# Patient Record
Sex: Female | Born: 1940
Health system: Southern US, Community
[De-identification: ages and names within clinical notes are randomized; demographics above are authoritative.]

## PROBLEM LIST (undated history)

## (undated) DIAGNOSIS — I10 Essential (primary) hypertension: Secondary | ICD-10-CM

## (undated) DIAGNOSIS — M1990 Unspecified osteoarthritis, unspecified site: Secondary | ICD-10-CM

## (undated) DIAGNOSIS — E785 Hyperlipidemia, unspecified: Secondary | ICD-10-CM

## (undated) DIAGNOSIS — E119 Type 2 diabetes mellitus without complications: Secondary | ICD-10-CM

## (undated) DIAGNOSIS — M810 Age-related osteoporosis without current pathological fracture: Secondary | ICD-10-CM

## (undated) HISTORY — DX: Type 2 diabetes mellitus without complications: E11.9

## (undated) HISTORY — DX: Unspecified osteoarthritis, unspecified site: M19.90

## (undated) HISTORY — PX: EYE SURGERY: SHX253

## (undated) HISTORY — DX: Essential (primary) hypertension: I10

## (undated) HISTORY — DX: Hyperlipidemia, unspecified: E78.5

## (undated) HISTORY — PX: ABDOMINAL HYSTERECTOMY: SHX81

## (undated) HISTORY — DX: Age-related osteoporosis without current pathological fracture: M81.0

---

## 2014-12-20 LAB — HM DEXA SCAN: HM DEXA SCAN: NORMAL

## 2015-02-25 ENCOUNTER — Ambulatory Visit: Payer: Self-pay | Admitting: Physician Assistant

## 2016-06-04 ENCOUNTER — Telehealth: Payer: Self-pay | Admitting: Behavioral Health

## 2016-06-04 NOTE — Telephone Encounter (Signed)
Patient's daughter will be accompanying her mother to the appointment on Monday, 6/19 and voiced that they would like to update all information on that day.

## 2016-06-07 ENCOUNTER — Ambulatory Visit (INDEPENDENT_AMBULATORY_CARE_PROVIDER_SITE_OTHER): Payer: Medicare HMO | Admitting: Family

## 2016-06-07 ENCOUNTER — Telehealth: Payer: Self-pay | Admitting: Family

## 2016-06-07 ENCOUNTER — Encounter: Payer: Self-pay | Admitting: Family

## 2016-06-07 VITALS — BP 150/70 | HR 63 | Temp 98.3°F | Resp 16 | Ht 66.0 in | Wt 174.0 lb

## 2016-06-07 DIAGNOSIS — E785 Hyperlipidemia, unspecified: Secondary | ICD-10-CM

## 2016-06-07 DIAGNOSIS — R635 Abnormal weight gain: Secondary | ICD-10-CM

## 2016-06-07 DIAGNOSIS — M542 Cervicalgia: Secondary | ICD-10-CM

## 2016-06-07 DIAGNOSIS — R7303 Prediabetes: Secondary | ICD-10-CM | POA: Insufficient documentation

## 2016-06-07 DIAGNOSIS — E559 Vitamin D deficiency, unspecified: Secondary | ICD-10-CM | POA: Diagnosis not present

## 2016-06-07 DIAGNOSIS — R55 Syncope and collapse: Secondary | ICD-10-CM

## 2016-06-07 DIAGNOSIS — I10 Essential (primary) hypertension: Secondary | ICD-10-CM

## 2016-06-07 DIAGNOSIS — E119 Type 2 diabetes mellitus without complications: Secondary | ICD-10-CM

## 2016-06-07 DIAGNOSIS — M5430 Sciatica, unspecified side: Secondary | ICD-10-CM

## 2016-06-07 LAB — LIPID PANEL
CHOL/HDL RATIO: 4
Cholesterol: 314 mg/dL — ABNORMAL HIGH (ref 0–200)
HDL: 83.7 mg/dL (ref >39.00)
LDL Cholesterol: 215 mg/dL — ABNORMAL HIGH (ref 0–99)
NONHDL: 229.84
TRIGLYCERIDES: 74 mg/dL (ref 0.0–149.0)
VLDL: 14.8 mg/dL (ref 0.0–40.0)

## 2016-06-07 LAB — MICROALBUMIN / CREATININE URINE RATIO
CREATININE, U: 45.9 mg/dL
MICROALB UR: 0.7 mg/dL (ref 0.0–1.9)
Microalb Creat Ratio: 1.5 mg/g (ref 0.0–30.0)

## 2016-06-07 LAB — BASIC METABOLIC PANEL
BUN: 12 mg/dL (ref 6–23)
CALCIUM: 9.6 mg/dL (ref 8.4–10.5)
CHLORIDE: 103 meq/L (ref 96–112)
CO2: 24 meq/L (ref 19–32)
CREATININE: 1.16 mg/dL (ref 0.40–1.20)
GFR: 58.23 mL/min — ABNORMAL LOW (ref >60.00)
GLUCOSE: 82 mg/dL (ref 70–99)
Potassium: 3.8 mEq/L (ref 3.5–5.1)
Sodium: 139 mEq/L (ref 135–145)

## 2016-06-07 LAB — VITAMIN D 25 HYDROXY (VIT D DEFICIENCY, FRACTURES): VITD: 54.39 ng/mL (ref 30.00–100.00)

## 2016-06-07 LAB — HEMOGLOBIN A1C: HEMOGLOBIN A1C: 5.3 % (ref 4.6–6.5)

## 2016-06-07 LAB — TSH: TSH: 2.77 u[IU]/mL (ref 0.35–4.50)

## 2016-06-07 MED ORDER — MELOXICAM 7.5 MG PO TABS: 7.5000 mg | ORAL_TABLET | Freq: Every day | ORAL | Status: DC

## 2016-06-07 MED ORDER — ATORVASTATIN CALCIUM 20 MG PO TABS: 20.0000 mg | ORAL_TABLET | Freq: Every day | ORAL | Status: DC

## 2016-06-07 MED ORDER — POTASSIUM CHLORIDE CRYS ER 20 MEQ PO TBCR: 20.0000 meq | EXTENDED_RELEASE_TABLET | Freq: Every day | ORAL | Status: DC

## 2016-06-07 NOTE — Assessment & Plan Note (Signed)
D/c simvastatin due to drug interaction with norvasc. Start atorvastatin 20mg .

## 2016-06-07 NOTE — Assessment & Plan Note (Signed)
Obtain follow up vit D level.  

## 2016-06-07 NOTE — Assessment & Plan Note (Signed)
Discussed diabetic diet.  Continue metformin. Obtain A1C, bmet, urine microalbumin.

## 2016-06-07 NOTE — Telephone Encounter (Signed)
Could you please request results from [facility] ER visit 2/17? Thanks

## 2016-06-07 NOTE — Progress Notes (Signed)
Pre visit review using our clinic review tool, if applicable. No additional management support is needed unless otherwise documented below in the visit note. 

## 2016-06-07 NOTE — Patient Instructions (Addendum)
Please complete lab work prior to leaving. Stop simvastatin, start atorvastatin. You will be contacted about scheduling your echocardiogram to check your hart.  For your back/hip pain- begin meloxicam once daily.  Let me know if your symptoms worsen or do not improve in the next 2 weeks.  Welcome to [facility]!

## 2016-06-07 NOTE — Assessment & Plan Note (Signed)
SBP a bit above goal. Continue current meds for now. Consider adjusting meds next visit if BP still above goal.

## 2016-06-07 NOTE — Telephone Encounter (Signed)
Records request faxed 

## 2016-06-07 NOTE — Progress Notes (Signed)
Subjective:    Patient ID: Sherry Perez, female    DOB: 12-08-1941, 75 y.o.   MRN: [id number]  HPI  Ms. Sherry Perez is a 75 yr old female who presents today to establish care.   Her chief complaint today is intermittent headaches (1x a week) for 1 year.  HA's last about 30 minutes, usually resolve if she lays down and relaxes herself. She reports a 2/17 and has had neck pain, left hip pain since the fall. Reports that she got dizzy and hit the floor (reports + syncope).  She went to [facility] at that time. She reports no further syncope or dizziness since that time.  Reports neck pain is left sided.  Reports hip pain is intermittent (worse with prolonged standing).  Describes pain as shooting down the left buttock.     Pmhx is significant for the following:  1) DM2- on metformin. Patient did not know she was diabetic.    2) HTN- maintained on plendil, amlodipine, lasix.   3) Hyperlipidemia- maintained on simvastatin.   4) Vit D deficiency- on oral supplement.    Review of Systems  Constitutional:       Reports that her weight is up 25 pounds over the last 6 months which she attributes to hip pain and less activity  HENT: Negative for rhinorrhea.   Eyes: Negative for visual disturbance.  Respiratory: Negative for cough.   Cardiovascular:       Intermittent LE swelling  Gastrointestinal: Negative for diarrhea.       Mild constipation, improved with dietary modification (increased veggies)  Genitourinary: Negative for dysuria and frequency.  Neurological: Positive for headaches.  Psychiatric/Behavioral:       Denies depression/anxiety   Past Medical History  Diagnosis Date  . Diabetes mellitus without complication ([place])   . HTN (hypertension)   . Hyperlipidemia      Social History   Social History  . Marital Status: Married    Spouse Name: N/A  . Number of Children: N/A  . Years of Education: N/A   Occupational History  . Not on file.   Social History Main  Topics  . Smoking status: Never Smoker   . Smokeless tobacco: Never Used  . Alcohol Use: No  . Drug Use: Not on file  . Sexual Activity: Not on file   Other Topics Concern  . 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 Abdominal hysterectomy      Family History  Problem Relation Age of Onset  . Diabetes Mother     No Known Allergies  No current outpatient prescriptions on file prior to visit.   No current facility-administered medications on file prior to visit.    BP 150/70 mmHg  Pulse 63  Temp(Src) 98.3 F (36.8 C) (Oral)  Resp 16  Ht 5\' 6"  (1.676 m)  Wt 174 lb (78.926 kg)  BMI 28.10 kg/m2  SpO2 100%       Objective:   Physical Exam  Constitutional: She is oriented to person, place, and time. She appears well-developed and well-nourished.  HENT:  Head: Normocephalic and atraumatic.  Right Ear: Tympanic membrane  and ear canal normal.  Left Ear: Tympanic membrane and ear canal normal.  Eyes: No scleral icterus.  Neck: No thyromegaly present.  Cardiovascular: Normal rate, regular rhythm and normal heart sounds.   No murmur heard. JVD noted bilaterally  Pulmonary/Chest: Effort normal and breath sounds normal. No respiratory distress. She has no wheezes.  Musculoskeletal:  1+ bilateral LE edema  Lymphadenopathy:    She has no cervical adenopathy.  Neurological: She is alert and oriented to person, place, and time.  Skin: Skin is warm and dry.  Psychiatric: She has a normal mood and affect. Her behavior is normal. Judgment and thought content normal.          Assessment & Plan:  Syncope/JVD- obtain 2D echo. Will request records from [facility] re:  Syncope visit in February of  this year.   Neck pain/sciatica- trial of short course of meloxicam.

## 2016-06-08 ENCOUNTER — Other Ambulatory Visit: Payer: Self-pay | Admitting: Family

## 2016-06-08 DIAGNOSIS — E785 Hyperlipidemia, unspecified: Secondary | ICD-10-CM

## 2016-06-08 MED ORDER — ATORVASTATIN CALCIUM 20 MG PO TABS: 40.0000 mg | ORAL_TABLET | Freq: Every day | ORAL | Status: DC

## 2016-06-08 NOTE — Telephone Encounter (Signed)
Notified pt and she voices understanding. Lab appt scheduled for 07/22/16 and order entered. Rx was sent yesterday to [facility] mail order for 20mg  once a day. Spoke with Georgina Snell at [facility] and he states they have not received atorvastatin 20mg  Rx. He will make note to cancel it if it still comes through. New dose sent with note to replace 20mg  Rx.

## 2016-06-08 NOTE — Telephone Encounter (Signed)
Cholesterol is extremely high. I would like her to increase lipitor from 20mg  to 40mg  once daily, repeat flp in 1 month. Sugar, thyroid, vitamin D, blood count all look good.

## 2016-06-09 NOTE — Telephone Encounter (Signed)
Received fax from [facility] stating they do not have any records for an ER visit.

## 2016-06-09 NOTE — Telephone Encounter (Signed)
Pt stated she went to [facility], could you please call her and verify where else she may have gone for that visit? Thanks.

## 2016-06-09 NOTE — Telephone Encounter (Signed)
Spoke with pt. States she was admitted to [facility] in February of 2017. Faxed request again. Awaiting response.

## 2016-06-10 MED ORDER — ATORVASTATIN CALCIUM 20 MG PO TABS: 40.0000 mg | ORAL_TABLET | Freq: Every day | ORAL | Status: DC

## 2016-06-10 NOTE — Addendum Note (Signed)
Addended by: Kelle Darting A on: 06/10/2016 10:02 AM   Modules accepted: Orders

## 2016-06-14 ENCOUNTER — Telehealth: Payer: Self-pay | Admitting: *Deleted

## 2016-06-14 NOTE — Telephone Encounter (Signed)
PA for atorvastatin submitted to [facility]. PA Case [id number] pending, awaiting determination. JG//CMA

## 2016-06-14 NOTE — Telephone Encounter (Signed)
Received call from medical records at [facility] and they verified that pt's last visit with them was 01/2015. They faxed information and it has been forwarded to PCP for review.

## 2016-06-14 NOTE — Telephone Encounter (Signed)
Left message on medical records voicemail at [facility] to check records again and let us know outcome.

## 2016-06-16 ENCOUNTER — Telehealth: Payer: Self-pay | Admitting: Family

## 2016-06-16 NOTE — Telephone Encounter (Signed)
Please cancel echo and notify pt. I received echo report from [facility]. Thanks.

## 2016-06-16 NOTE — Telephone Encounter (Signed)
PA approved  PA Case: [id number], Status: Approved, Coverage Starts on: 06/14/2016 12:00:00 AM, Coverage Ends on: 09/14/2016 12:00:00 AM. Questions? Contact [phone].

## 2016-06-17 NOTE — Telephone Encounter (Signed)
Appointment cancelled. Left detailed message on pt's home voicemail and to call if any questions.

## 2016-06-25 ENCOUNTER — Other Ambulatory Visit (HOSPITAL_COMMUNITY): Payer: Medicare HMO

## 2016-07-22 ENCOUNTER — Other Ambulatory Visit: Payer: Medicare HMO

## 2016-09-13 ENCOUNTER — Ambulatory Visit (INDEPENDENT_AMBULATORY_CARE_PROVIDER_SITE_OTHER): Payer: Commercial Managed Care - HMO | Admitting: Family

## 2016-09-13 ENCOUNTER — Encounter: Payer: Self-pay | Admitting: Family

## 2016-09-13 ENCOUNTER — Telehealth: Payer: Self-pay | Admitting: Family

## 2016-09-13 VITALS — BP 172/74 | HR 66 | Temp 98.2°F | Resp 16 | Ht 66.0 in | Wt 168.0 lb

## 2016-09-13 DIAGNOSIS — E119 Type 2 diabetes mellitus without complications: Secondary | ICD-10-CM | POA: Diagnosis not present

## 2016-09-13 DIAGNOSIS — E785 Hyperlipidemia, unspecified: Secondary | ICD-10-CM | POA: Diagnosis not present

## 2016-09-13 DIAGNOSIS — Z23 Encounter for immunization: Secondary | ICD-10-CM

## 2016-09-13 DIAGNOSIS — I10 Essential (primary) hypertension: Secondary | ICD-10-CM

## 2016-09-13 DIAGNOSIS — M1990 Unspecified osteoarthritis, unspecified site: Secondary | ICD-10-CM | POA: Insufficient documentation

## 2016-09-13 LAB — LIPID PANEL
CHOL/HDL RATIO: 2
Cholesterol: 213 mg/dL — ABNORMAL HIGH (ref 0–200)
HDL: 85.4 mg/dL (ref >39.00)
LDL Cholesterol: 115 mg/dL — ABNORMAL HIGH (ref 0–99)
NonHDL: 127.89
TRIGLYCERIDES: 66 mg/dL (ref 0.0–149.0)
VLDL: 13.2 mg/dL (ref 0.0–40.0)

## 2016-09-13 LAB — BASIC METABOLIC PANEL
BUN: 14 mg/dL (ref 6–23)
CALCIUM: 9.4 mg/dL (ref 8.4–10.5)
CO2: 27 meq/L (ref 19–32)
Chloride: 102 mEq/L (ref 96–112)
Creatinine, Ser: 1.13 mg/dL (ref 0.40–1.20)
GFR: 59.97 mL/min — AB (ref >60.00)
Glucose, Bld: 79 mg/dL (ref 70–99)
POTASSIUM: 3.7 meq/L (ref 3.5–5.1)
SODIUM: 139 meq/L (ref 135–145)

## 2016-09-13 LAB — HEMOGLOBIN A1C: HEMOGLOBIN A1C: 5.7 % (ref 4.6–6.5)

## 2016-09-13 MED ORDER — POTASSIUM CHLORIDE CRYS ER 20 MEQ PO TBCR: 20.0000 meq | EXTENDED_RELEASE_TABLET | Freq: Every day | ORAL | 1 refills | Status: DC

## 2016-09-13 MED ORDER — FUROSEMIDE 40 MG PO TABS: 40.0000 mg | ORAL_TABLET | Freq: Every day | ORAL | 1 refills | Status: DC

## 2016-09-13 MED ORDER — AMLODIPINE BESYLATE 10 MG PO TABS
10.0000 mg | ORAL_TABLET | Freq: Every day | ORAL | 1 refills | Status: DC
Start: 2016-09-13 — End: 2017-02-22

## 2016-09-13 MED ORDER — ATORVASTATIN CALCIUM 40 MG PO TABS: 40.0000 mg | ORAL_TABLET | Freq: Every day | ORAL | 1 refills | Status: DC

## 2016-09-13 MED ORDER — METFORMIN HCL ER 500 MG PO TB24: 500.0000 mg | ORAL_TABLET | Freq: Two times a day (BID) | ORAL | 1 refills | Status: DC

## 2016-09-13 MED ORDER — ACETAMINOPHEN 500 MG PO TABS: 1000.0000 mg | ORAL_TABLET | Freq: Two times a day (BID) | ORAL | 0 refills | Status: DC

## 2016-09-13 NOTE — Telephone Encounter (Signed)
Cholesterol is much better but still above goal. I would like her to increase atorvastatin to 40mg . Rx has been sent to her mail order.  Repeat flp in 6 weeks. Sugar appears well controlled.

## 2016-09-13 NOTE — Telephone Encounter (Signed)
Patient has been updated on labs she voices understanding. PC

## 2016-09-13 NOTE — Progress Notes (Signed)
Subjective:    Patient ID: Sherry Perez, female    DOB: Nov 05, 1941, 75 y.o.   MRN: [id number]  HPI  Sherry Perez is a 75 yr old female who presents today for follow up.  1) DM2- She is currently maintained on metformin. She reports that her sugars at home are generally 120-160. She tries to be compliant with diet.  Lab Results  Component Value Date   HGBA1C 5.3 06/07/2016   Lab Results  Component Value Date   MICROALBUR 0.7 06/07/2016   LDLCALC 215 (H) 06/07/2016   CREATININE 1.16 06/07/2016   2) HTN- current medications include amlodipine 5mg  and furosemide.  She denies CP or sob.   BP Readings from Last 3 Encounters:  09/13/16 (!) 174/56  06/07/16 (!) 150/70   3) Hyperlipidemia- she is maintained on lipitor 20mg .  Lab Results  Component Value Date   CHOL 314 (H) 06/07/2016   HDL 83.70 06/07/2016   LDLCALC 215 (H) 06/07/2016   TRIG 74.0 06/07/2016   CHOLHDL 4 06/07/2016   4) Bilateral leg pain- Reports that her pain is intermittent and is in her bilateral knees.  She does report some left hip pain.  Also has shoulder and hip pain. Tylenol helps.  She doesn't take every day.   5) Neck problem-  Popping in the neck with turning.   Review of Systems See HPI  Past Medical History:  Diagnosis Date  . Diabetes mellitus without complication ([place])   . HTN (hypertension)   . Hyperlipidemia      Social History   Social History  . Marital status: Married    Spouse name: N/A  . Number of children: N/A  . Years of education: N/A   Occupational History  . Not on file.   Social History Main Topics  . Smoking status: Never Smoker  . Smokeless tobacco: Never Used  . Alcohol use No  . Drug use: Unknown  . Sexual activity: Not on file   Other Topics Concern  . 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 ABDOMINAL HYSTERECTOMY      Family History  Problem Relation Age of Onset  . Diabetes Mother     No Known Allergies  Current Outpatient Prescriptions on File Prior to Visit  Medication Sig Dispense Refill  . aspirin 81 MG tablet Take 81 mg by mouth daily.    Marland Kitchen atorvastatin (LIPITOR) 20 MG tablet Take 2 tablets (40 mg total) by mouth daily. 180 tablet 1  . cholecalciferol (VITAMIN D) 1000 units tablet Take 1,000 Units by mouth daily.     No current facility-administered medications on file prior to visit.     BP (!) 172/74   Pulse 66   Temp 98.2 F (36.8 C) (Oral)   Resp 16   Ht 5\' 6"  (1.676 m)   Wt 168 lb (76.2 kg)   SpO2 100% Comment: room air  BMI 27.12 kg/m       Objective:   Physical Exam  Constitutional: She is oriented to person, place, and time. She appears well-developed and well-nourished.  HENT:  Head: Normocephalic and atraumatic.  Cardiovascular: Normal rate,  regular rhythm and normal heart sounds.   No murmur heard. Pulmonary/Chest: Effort normal and breath sounds normal. No respiratory distress. She has no wheezes.  Neurological: She is alert and oriented to person, place, and time.  Psychiatric: She has a normal mood and affect. Her behavior is normal. Judgment and thought content normal.          Assessment & Plan:  Flu shot today.

## 2016-09-13 NOTE — Assessment & Plan Note (Signed)
Clinically stable on metformin, continue same. Obtain A1C. Flu shot today.

## 2016-09-13 NOTE — Assessment & Plan Note (Signed)
Will see how her numbers look on lipitor. Obtain flp.

## 2016-09-13 NOTE — Patient Instructions (Signed)
Increase amlodipine from 5mg  to 10mg  once daily.  Add tylenol 1000mg  twice daily for arthritis pain. Complete lab work prior to leaving.

## 2016-09-13 NOTE — Assessment & Plan Note (Signed)
Uncontrolled. Will increase amlodipine from 5mg  to 10mg .  Continue lasix. Obtain bmet.

## 2016-09-13 NOTE — Assessment & Plan Note (Signed)
Likely cause for neck, knee, hip pain. Advised pt to add standing bid tylenol dose. Would like to avoid chronic nsaid use given her age and DM2 if possible.  We will re-evaluate in 1 month.

## 2016-09-13 NOTE — Progress Notes (Signed)
Pre visit review using our clinic review tool, if applicable. No additional management support is needed unless otherwise documented below in the visit note. 

## 2016-10-11 ENCOUNTER — Encounter: Payer: Self-pay | Admitting: Family

## 2016-10-11 ENCOUNTER — Ambulatory Visit (INDEPENDENT_AMBULATORY_CARE_PROVIDER_SITE_OTHER): Payer: Commercial Managed Care - HMO | Admitting: Family

## 2016-10-11 DIAGNOSIS — E119 Type 2 diabetes mellitus without complications: Secondary | ICD-10-CM | POA: Diagnosis not present

## 2016-10-11 DIAGNOSIS — I10 Essential (primary) hypertension: Secondary | ICD-10-CM | POA: Diagnosis not present

## 2016-10-11 DIAGNOSIS — E559 Vitamin D deficiency, unspecified: Secondary | ICD-10-CM

## 2016-10-11 DIAGNOSIS — E785 Hyperlipidemia, unspecified: Secondary | ICD-10-CM | POA: Diagnosis not present

## 2016-10-11 MED ORDER — POTASSIUM CHLORIDE CRYS ER 20 MEQ PO TBCR: 10.0000 meq | EXTENDED_RELEASE_TABLET | Freq: Every day | ORAL | 1 refills | Status: DC

## 2016-10-11 MED ORDER — LISINOPRIL 10 MG PO TABS: 10.0000 mg | ORAL_TABLET | Freq: Every day | ORAL | 3 refills | Status: DC

## 2016-10-11 NOTE — Assessment & Plan Note (Signed)
BP is uncontrolled. Will add lisinopril, cut Kdur dose in half to avoid hyperkalemia.

## 2016-10-11 NOTE — Assessment & Plan Note (Signed)
Clinically stable on metformin, continue same.

## 2016-10-11 NOTE — Progress Notes (Signed)
Subjective:    Patient ID: Sherry Perez, female    DOB: November 27, 1941, 75 y.o.   MRN: [id number]  HPI  Sherry Perez is a 75 yr old female who presents today for follow up.  1) HTN- current BP medications include amlodipine 10mg .  She is also maintained on lasix 40mg .  Denies CP/SOB or swelling.  BP Readings from Last 3 Encounters:  10/11/16 (!) 179/63  09/13/16 (!) 172/74  06/07/16 (!) 150/70   2) Hyperlipidemia- last visit her lipitor dose was increased from 20mg  to 40mg . Denies myalgia. Lab Results  Component Value Date   CHOL 213 (H) 09/13/2016   HDL 85.40 09/13/2016   LDLCALC 115 (H) 09/13/2016   TRIG 66.0 09/13/2016   CHOLHDL 2 09/13/2016   3) DM2- maintained on metformin.   Lab Results  Component Value Date   HGBA1C 5.7 09/13/2016   HGBA1C 5.3 06/07/2016   Lab Results  Component Value Date   MICROALBUR 0.7 06/07/2016   LDLCALC 115 (H) 09/13/2016   CREATININE 1.13 09/13/2016       Review of Systems See HPI  Past Medical History:  Diagnosis Date  . Diabetes mellitus without complication ([place])   . HTN (hypertension)   . Hyperlipidemia      Social History   Social History  . Marital status: Married    Spouse name: N/A  . Number of children: N/A  . Years of education: N/A   Occupational History  . Not on file.   Social History Main Topics  . Smoking status: Never Smoker  . Smokeless tobacco: Never Used  . Alcohol use No  . Drug use: Unknown  . Sexual activity: Not on file   Other Topics Concern  . 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 ABDOMINAL HYSTERECTOMY      Family History  Problem Relation Age of Onset  . Diabetes Mother     No Known  Allergies  Current Outpatient Prescriptions on File Prior to Visit  Medication Sig Dispense Refill  . acetaminophen (TYLENOL) 500 MG tablet Take 2 tablets (1,000 mg total) by mouth 2 (two) times daily. 30 tablet 0  . amLODipine (NORVASC) 10 MG tablet Take 1 tablet (10 mg total) by mouth daily. 90 tablet 1  . aspirin 81 MG tablet Take 81 mg by mouth daily.    Marland Kitchen atorvastatin (LIPITOR) 40 MG tablet Take 1 tablet (40 mg total) by mouth daily. 90 tablet 1  . cholecalciferol (VITAMIN D) 1000 units tablet Take 1,000 Units by mouth daily.    . furosemide (LASIX) 40 MG tablet Take 1 tablet (40 mg total) by mouth daily. 90 tablet 1  . metFORMIN (GLUCOPHAGE-XR) 500 MG 24 hr tablet Take 1 tablet (500 mg total) by mouth 2 (two) times daily. 90 tablet 1   No current facility-administered medications on file prior to visit.     BP (!) 179/63 (BP Location: Right Arm, Patient Position: Sitting, Cuff Size: Normal)   Pulse 74   Temp 98.5 F (36.9 C) (Oral)   Resp 16   Ht 5\' 6"  (1.676 m)   Wt 165 lb (74.8 kg)  SpO2 100% Comment: room air  BMI 26.63 kg/m       Objective:   Physical Exam  Constitutional: She is oriented to person, place, and time. She appears well-developed and well-nourished.  HENT:  Head: Normocephalic and atraumatic.  Cardiovascular: Normal rate, regular rhythm and normal heart sounds.   No murmur heard. Pulmonary/Chest: Effort normal and breath sounds normal. No respiratory distress. She has no wheezes.  Musculoskeletal: She exhibits no edema.  Neurological: She is alert and oriented to person, place, and time.  Psychiatric: She has a normal mood and affect. Her behavior is normal. Judgment and thought content normal.          Assessment & Plan:  Will request records from her previous PCP re: immunizations. She believes she had a pneumovax.

## 2016-10-11 NOTE — Assessment & Plan Note (Signed)
Tolerating increased dose of statin. Plan to repeat lipid panel at her follow up visit in 2 weeks.

## 2016-10-11 NOTE — Patient Instructions (Addendum)
Start lisinopril 10mg  once daily. Cut your potassium supplement in half and take 1/2 tab once daily.

## 2016-10-19 ENCOUNTER — Telehealth: Payer: Self-pay

## 2016-10-19 NOTE — Telephone Encounter (Signed)
Release of Records Request (Immunization Records) faxed to [facility] at [phone] on 10/19/2016. Awaiting records.

## 2016-10-22 NOTE — Telephone Encounter (Signed)
Angie-- have you seen any of these?

## 2016-10-25 ENCOUNTER — Ambulatory Visit (INDEPENDENT_AMBULATORY_CARE_PROVIDER_SITE_OTHER): Payer: Commercial Managed Care - HMO | Admitting: Family

## 2016-10-25 ENCOUNTER — Encounter: Payer: Self-pay | Admitting: Family

## 2016-10-25 VITALS — BP 133/66 | HR 73 | Temp 98.7°F | Resp 16 | Ht 66.0 in | Wt 163.8 lb

## 2016-10-25 DIAGNOSIS — I10 Essential (primary) hypertension: Secondary | ICD-10-CM | POA: Diagnosis not present

## 2016-10-25 DIAGNOSIS — E785 Hyperlipidemia, unspecified: Secondary | ICD-10-CM | POA: Diagnosis not present

## 2016-10-25 LAB — BASIC METABOLIC PANEL
BUN: 18 mg/dL (ref 6–23)
CALCIUM: 9.8 mg/dL (ref 8.4–10.5)
CO2: 31 mEq/L (ref 19–32)
CREATININE: 1.41 mg/dL — AB (ref 0.40–1.20)
Chloride: 98 mEq/L (ref 96–112)
GFR: 46.44 mL/min — AB (ref >60.00)
Glucose, Bld: 84 mg/dL (ref 70–99)
Potassium: 4.2 mEq/L (ref 3.5–5.1)
SODIUM: 139 meq/L (ref 135–145)

## 2016-10-25 LAB — LIPID PANEL
CHOLESTEROL: 201 mg/dL — AB (ref 0–200)
HDL: 82.3 mg/dL (ref >39.00)
LDL Cholesterol: 103 mg/dL — ABNORMAL HIGH (ref 0–99)
NonHDL: 118.44
TRIGLYCERIDES: 78 mg/dL (ref 0.0–149.0)
Total CHOL/HDL Ratio: 2
VLDL: 15.6 mg/dL (ref 0.0–40.0)

## 2016-10-25 NOTE — Assessment & Plan Note (Signed)
Tolerating statin, obtain follow-up lipid panel. 

## 2016-10-25 NOTE — Progress Notes (Signed)
Subjective:    Patient ID: Sherry Perez, female    DOB: 1941-04-16, 75 y.o.   MRN: [id number]  HPI  Sherry Perez is a 75 yr old female who presents today for follow up.  Hypertension- Last visit lisinopril 10mg  was added to her lasix and her amlodipine.  She was advised to cut her potassium supplement in half.  She notes tolerating the medication. She does report that she has had some mild dizziness a few times when she stood up suddenly. Also notes a mild dry cough without any other cold symptoms. She does not find these symptoms severe enough to discontinue the lisinopril.   BP Readings from Last 3 Encounters:  10/25/16 133/66  10/11/16 (!) 179/63  09/13/16 (!) 172/74   Hyperlipidemia- statin dose was increased a few months back.     Review of Systems See HPI  Past Medical History:  Diagnosis Date  . Diabetes mellitus without complication ([place])   . HTN (hypertension)   . Hyperlipidemia      Social History   Social History  . Marital status: Married    Spouse name: N/A  . Number of children: N/A  . Years of education: N/A   Occupational History  . Not on file.   Social History Main Topics  . Smoking status: Never Smoker  . Smokeless tobacco: Never Used  . Alcohol use No  . Drug use: Unknown  . Sexual activity: Not on file   Other Topics Concern  . 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 ABDOMINAL HYSTERECTOMY      Family History  Problem Relation Age of Onset  . Diabetes Mother     No Known Allergies  Current Outpatient Prescriptions on File Prior to Visit  Medication Sig Dispense Refill  . acetaminophen (TYLENOL) 500 MG tablet Take 2 tablets (1,000 mg total) by mouth 2  (two) times daily. 30 tablet 0  . amLODipine (NORVASC) 10 MG tablet Take 1 tablet (10 mg total) by mouth daily. 90 tablet 1  . aspirin 81 MG tablet Take 81 mg by mouth daily.    Marland Kitchen atorvastatin (LIPITOR) 40 MG tablet Take 1 tablet (40 mg total) by mouth daily. 90 tablet 1  . cholecalciferol (VITAMIN D) 1000 units tablet Take 1,000 Units by mouth daily.    . furosemide (LASIX) 40 MG tablet Take 1 tablet (40 mg total) by mouth daily. 90 tablet 1  . lisinopril (PRINIVIL,ZESTRIL) 10 MG tablet Take 1 tablet (10 mg total) by mouth daily. 30 tablet 3  . metFORMIN (GLUCOPHAGE-XR) 500 MG 24 hr tablet Take 1 tablet (500 mg total) by mouth 2 (two) times daily. 90 tablet 1  . potassium chloride SA (K-DUR,KLOR-CON) 20 MEQ tablet Take 0.5 tablets (10 mEq total) by mouth daily. 90 tablet 1   No current facility-administered medications on file prior to visit.     BP 133/66 (BP Location: Left Arm, Patient Position: Sitting, Cuff Size: Normal)   Pulse 73   Temp 98.7 F (37.1 C) (Oral)   Resp 16   Ht 5\' 6"  (1.676 m)   Wt 163 lb 12.8 oz (74.3 kg)  SpO2 100% Comment: RA  BMI 26.44 kg/m       Objective:   Physical Exam  Constitutional: She is oriented to person, place, and time. She appears well-developed and well-nourished.  HENT:  Head: Normocephalic and atraumatic.  Cardiovascular: Normal rate, regular rhythm and normal heart sounds.   No murmur heard. Pulmonary/Chest: Effort normal and breath sounds normal. No respiratory distress. She has no wheezes.  Musculoskeletal: She exhibits no edema.  Neurological: She is alert and oriented to person, place, and time.  Skin: Skin is warm and dry.  Psychiatric: She has a normal mood and affect. Her behavior is normal. Judgment and thought content normal.          Assessment & Plan:

## 2016-10-25 NOTE — Progress Notes (Signed)
Pre visit review using our clinic review tool, if applicable. No additional management support is needed unless otherwise documented below in the visit note. 

## 2016-10-25 NOTE — Assessment & Plan Note (Signed)
BP stable on current medications.  I have advised pt to stand slowly. Let me know if cough or dizziness worsen.Obtain follow up bmet.

## 2016-10-25 NOTE — Patient Instructions (Signed)
Pleas complete lab work prior to leaving.  

## 2016-10-26 ENCOUNTER — Telehealth: Payer: Self-pay | Admitting: Family

## 2016-10-26 MED ORDER — METOPROLOL SUCCINATE ER 50 MG PO TB24: 50.0000 mg | ORAL_TABLET | Freq: Every day | ORAL | 3 refills | Status: DC

## 2016-10-26 NOTE — Telephone Encounter (Signed)
Kidney function has worsened slightly.  I would like her to stop lisinopril, begin toprol xl once daily, follow up in 1 month.  Avoid any NSAIDS if she is taking.

## 2016-10-26 NOTE — Telephone Encounter (Signed)
Patient was made aware of labs and provider's recommendations. She voiced understanding and did not have any further questions or concerns. Follow-up appointment scheduled for 11/26/16 at 2:00 PM.

## 2016-11-25 ENCOUNTER — Telehealth: Payer: Self-pay | Admitting: *Deleted

## 2016-11-25 NOTE — Telephone Encounter (Signed)
Pt declined

## 2016-11-26 ENCOUNTER — Ambulatory Visit (INDEPENDENT_AMBULATORY_CARE_PROVIDER_SITE_OTHER): Payer: Commercial Managed Care - HMO | Admitting: Family

## 2016-11-26 ENCOUNTER — Encounter: Payer: Self-pay | Admitting: Family

## 2016-11-26 VITALS — BP 138/58 | HR 66 | Temp 98.4°F | Resp 16 | Ht 66.0 in | Wt 157.2 lb

## 2016-11-26 DIAGNOSIS — E1121 Type 2 diabetes mellitus with diabetic nephropathy: Secondary | ICD-10-CM | POA: Diagnosis not present

## 2016-11-26 DIAGNOSIS — I10 Essential (primary) hypertension: Secondary | ICD-10-CM

## 2016-11-26 DIAGNOSIS — R059 Cough, unspecified: Secondary | ICD-10-CM

## 2016-11-26 DIAGNOSIS — R05 Cough: Secondary | ICD-10-CM | POA: Diagnosis not present

## 2016-11-26 LAB — BASIC METABOLIC PANEL
BUN: 14 mg/dL (ref 7–25)
CHLORIDE: 98 mmol/L (ref 98–110)
CO2: 27 mmol/L (ref 20–31)
CREATININE: 1.25 mg/dL — AB (ref 0.60–0.93)
Calcium: 10.1 mg/dL (ref 8.6–10.4)
Glucose, Bld: 87 mg/dL (ref 65–99)
POTASSIUM: 4.1 mmol/L (ref 3.5–5.3)
Sodium: 140 mmol/L (ref 135–146)

## 2016-11-26 NOTE — Progress Notes (Signed)
Subjective:    Patient ID: Sherry Perez, female    DOB: November 21, 1941, 75 y.o.   MRN: [id number]  HPI  Sherry Perez is a 75 yr old female who presents today for follow up.  Renal insufficiency- last visit she was noted to have mild worsening of her kidney function.  She was advised to avoid NSAIDS.  Lab Results  Component Value Date   CREATININE 1.41 (H) 10/25/2016   HTN- denies CP/SOB or swelling.  BP Readings from Last 3 Encounters:  11/26/16 (!) 138/58  10/25/16 133/66  10/11/16 (!) 179/63   Reports a dry cough in the AM. No fever.    Lab Results  Component Value Date   HGBA1C 5.7 09/13/2016   HGBA1C 5.3 06/07/2016   Lab Results  Component Value Date   MICROALBUR 0.7 06/07/2016   LDLCALC 103 (H) 10/25/2016   CREATININE 1.41 (H) 10/25/2016      Review of Systems    see HPI  Past Medical History:  Diagnosis Date  . Diabetes mellitus without complication ([place])   . HTN (hypertension)   . Hyperlipidemia      Social History   Social History  . Marital status: Married    Spouse name: N/A  . Number of children: N/A  . Years of education: N/A   Occupational History  . Not on file.   Social History Main Topics  . Smoking status: Never Smoker  . Smokeless tobacco: Never Used  . Alcohol use No  . Drug use: Unknown  . Sexual activity: Not on file   Other Topics Concern  . 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 ABDOMINAL HYSTERECTOMY      Family History  Problem Relation Age of Onset  . Diabetes Mother     Allergies  Allergen Reactions  . Lisinopril     Renal insufficiency    Current Outpatient Prescriptions on File Prior to Visit  Medication Sig Dispense Refill  .  acetaminophen (TYLENOL) 500 MG tablet Take 2 tablets (1,000 mg total) by mouth 2 (two) times daily. 30 tablet 0  . amLODipine (NORVASC) 10 MG tablet Take 1 tablet (10 mg total) by mouth daily. 90 tablet 1  . aspirin 81 MG tablet Take 81 mg by mouth daily.    Marland Kitchen atorvastatin (LIPITOR) 40 MG tablet Take 1 tablet (40 mg total) by mouth daily. 90 tablet 1  . cholecalciferol (VITAMIN D) 1000 units tablet Take 1,000 Units by mouth daily.    . furosemide (LASIX) 40 MG tablet Take 1 tablet (40 mg total) by mouth daily. 90 tablet 1  . metoprolol succinate (TOPROL-XL) 50 MG 24 hr tablet Take 1 tablet (50 mg total) by mouth daily. Take with or immediately following a meal. 30 tablet 3  . potassium chloride SA (K-DUR,KLOR-CON) 20 MEQ tablet Take 0.5 tablets (10 mEq total) by mouth daily. 90 tablet 1   No current facility-administered medications on file prior to visit.     BP (!) 138/58   Pulse 66   Temp 98.4 F (36.9 C) (Oral)   Resp 16   Ht 5\' 6"  (1.676 m)   Wt  157 lb 3.2 oz (71.3 kg)   SpO2 100% Comment: room air  BMI 25.37 kg/m    Objective:   Physical Exam  Constitutional: She is oriented to person, place, and time. She appears well-developed and well-nourished.  HENT:  Head: Normocephalic and atraumatic.  Cardiovascular: Normal rate, regular rhythm and normal heart sounds.   No murmur heard. Pulmonary/Chest: Effort normal and breath sounds normal. No respiratory distress. She has no wheezes.  Musculoskeletal: She exhibits no edema.  Neurological: She is alert and oriented to person, place, and time.  Skin: Skin is warm and dry.  Psychiatric: She has a normal mood and affect. Her behavior is normal. Judgment and thought content normal.          Assessment & Plan:  Cough- mild, dry, intermittent- advised pt to monitor and let me know if cough worsens or if cough does not improve.

## 2016-11-26 NOTE — Progress Notes (Signed)
Pre visit review using our clinic review tool, if applicable. No additional management support is needed unless otherwise documented below in the visit note. 

## 2016-11-26 NOTE — Assessment & Plan Note (Signed)
Given her very well controlled sugars and borderline renal function, will d/c metformin.  Too soon to repeat A1C.

## 2016-11-26 NOTE — Patient Instructions (Signed)
Please complete lab work prior to leaving. Stop metformin.

## 2016-11-26 NOTE — Assessment & Plan Note (Signed)
BP recheck looks good. Continue current medications. Obtain follow up bmet to assess kidney function.

## 2016-12-25 ENCOUNTER — Other Ambulatory Visit: Payer: Self-pay | Admitting: Family

## 2017-01-21 ENCOUNTER — Other Ambulatory Visit: Payer: Self-pay | Admitting: Family

## 2017-01-24 ENCOUNTER — Ambulatory Visit: Payer: Commercial Managed Care - HMO | Admitting: Family

## 2017-01-24 ENCOUNTER — Telehealth: Payer: Self-pay | Admitting: *Deleted

## 2017-01-24 NOTE — Telephone Encounter (Signed)
Received call from pharmacist at [facility] stating pt was there to pick up medications and was confused about whether or not she should still be taking lisinopril. Per 10/26/16 phone note. Pt was advised to stop Lisinopril and start Metoprolol. Notified pharmacist and she will confirm with pt.

## 2017-02-20 ENCOUNTER — Other Ambulatory Visit: Payer: Self-pay | Admitting: Family

## 2017-02-22 ENCOUNTER — Ambulatory Visit (INDEPENDENT_AMBULATORY_CARE_PROVIDER_SITE_OTHER): Payer: Medicare HMO | Admitting: Family

## 2017-02-22 ENCOUNTER — Encounter: Payer: Self-pay | Admitting: Family

## 2017-02-22 VITALS — BP 185/65 | HR 52 | Temp 98.1°F | Resp 16 | Ht 66.0 in | Wt 159.0 lb

## 2017-02-22 DIAGNOSIS — I10 Essential (primary) hypertension: Secondary | ICD-10-CM | POA: Diagnosis not present

## 2017-02-22 DIAGNOSIS — M545 Low back pain: Secondary | ICD-10-CM | POA: Diagnosis not present

## 2017-02-22 DIAGNOSIS — E785 Hyperlipidemia, unspecified: Secondary | ICD-10-CM | POA: Diagnosis not present

## 2017-02-22 DIAGNOSIS — E119 Type 2 diabetes mellitus without complications: Secondary | ICD-10-CM | POA: Diagnosis not present

## 2017-02-22 LAB — BASIC METABOLIC PANEL
BUN: 16 mg/dL (ref 6–23)
CO2: 30 meq/L (ref 19–32)
Calcium: 9.6 mg/dL (ref 8.4–10.5)
Chloride: 105 mEq/L (ref 96–112)
Creatinine, Ser: 1.14 mg/dL (ref 0.40–1.20)
GFR: 59.3 mL/min — AB (ref >60.00)
GLUCOSE: 82 mg/dL (ref 70–99)
Potassium: 4.9 mEq/L (ref 3.5–5.1)
SODIUM: 141 meq/L (ref 135–145)

## 2017-02-22 LAB — LIPID PANEL
CHOLESTEROL: 218 mg/dL — AB (ref 0–200)
HDL: 87.3 mg/dL (ref >39.00)
LDL Cholesterol: 121 mg/dL — ABNORMAL HIGH (ref 0–99)
NonHDL: 130.7
Total CHOL/HDL Ratio: 2
Triglycerides: 51 mg/dL (ref 0.0–149.0)
VLDL: 10.2 mg/dL (ref 0.0–40.0)

## 2017-02-22 LAB — HEMOGLOBIN A1C: Hgb A1c MFr Bld: 5.6 % (ref 4.6–6.5)

## 2017-02-22 MED ORDER — ATORVASTATIN CALCIUM 40 MG PO TABS: 40.0000 mg | ORAL_TABLET | Freq: Every day | ORAL | 1 refills | Status: DC

## 2017-02-22 MED ORDER — METOPROLOL SUCCINATE ER 50 MG PO TB24: ORAL_TABLET | ORAL | 1 refills | Status: DC

## 2017-02-22 MED ORDER — POTASSIUM CHLORIDE CRYS ER 20 MEQ PO TBCR: 10.0000 meq | EXTENDED_RELEASE_TABLET | Freq: Every day | ORAL | 1 refills | Status: DC

## 2017-02-22 MED ORDER — FUROSEMIDE 40 MG PO TABS: 40.0000 mg | ORAL_TABLET | Freq: Every day | ORAL | 1 refills | Status: DC

## 2017-02-22 MED ORDER — AMLODIPINE BESYLATE 10 MG PO TABS: 10.0000 mg | ORAL_TABLET | Freq: Every day | ORAL | 1 refills | Status: DC

## 2017-02-22 NOTE — Assessment & Plan Note (Signed)
LDL was above goal last visit. Obtain follow up lipid panel, continue statin.

## 2017-02-22 NOTE — Progress Notes (Signed)
Pre visit review using our clinic review tool, if applicable. No additional management support is needed unless otherwise documented below in the visit note. 

## 2017-02-22 NOTE — Patient Instructions (Signed)
Apply a heating pad to lower back twice daily as needed. Begin tylenol 650mg  every 6 hours as needed for back/leg pain.  Call if pain worsens or if not improved in 2 weeks.  Take your lasix as soon as you pick it up.

## 2017-02-22 NOTE — Assessment & Plan Note (Signed)
SBP very high, DBP is on the low side. Advised pt to pick up and restart lasix this AM.  Follow up in 2 weeks for follow up BP.

## 2017-02-22 NOTE — Assessment & Plan Note (Signed)
Clinically stable.  Obtain follow up A1C.

## 2017-02-22 NOTE — Progress Notes (Signed)
Subjective:    Patient ID: Sherry Perez, female    DOB: 1941-04-17, 76 y.o.   MRN: [id number]  HPI  Sherry Perez is a 76 yr old female who presents today for follow up.  Current BP meds include amlodipine, furosemide, toprol xl.  Reports that she ran out of lasix about 3 weeks. Ago.   BP Readings from Last 3 Encounters:  02/22/17 (!) 185/65  11/26/16 (!) 138/58  10/25/16 133/66   DM2- diet controlled.  Lab Results  Component Value Date   HGBA1C 5.7 09/13/2016   HGBA1C 5.3 06/07/2016   Lab Results  Component Value Date   MICROALBUR 0.7 06/07/2016   LDLCALC 103 (H) 10/25/2016   CREATININE 1.25 (H) 11/26/2016   Hyperlipidemia-  Lab Results  Component Value Date   CHOL 201 (H) 10/25/2016   HDL 82.30 10/25/2016   LDLCALC 103 (H) 10/25/2016   TRIG 78.0 10/25/2016   CHOLHDL 2 10/25/2016    Reports some low back pain, radiates into the right thigh.   Review of Systems    see HPI  Past Medical History:  Diagnosis Date  . Diabetes mellitus without complication ([place])   . HTN (hypertension)   . Hyperlipidemia      Social History   Social History  . Marital status: Married    Spouse name: N/A  . Number of children: N/A  . Years of education: N/A   Occupational History  . Not on file.   Social History Main Topics  . Smoking status: Never Smoker  . Smokeless tobacco: Never Used  . Alcohol use No  . Drug use: Unknown  . Sexual activity: Not on file   Other Topics Concern  . 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 ABDOMINAL HYSTERECTOMY      Family History  Problem Relation Age of Onset  . Diabetes Mother     Allergies  Allergen Reactions  . Lisinopril     Renal  insufficiency    Current Outpatient Prescriptions on File Prior to Visit  Medication Sig Dispense Refill  . acetaminophen (TYLENOL) 500 MG tablet Take 2 tablets (1,000 mg total) by mouth 2 (two) times daily. 30 tablet 0  . aspirin 81 MG tablet Take 81 mg by mouth daily.    . cholecalciferol (VITAMIN D) 1000 units tablet Take 1,000 Units by mouth daily.     No current facility-administered medications on file prior to visit.     BP (!) 185/65   Pulse (!) 52   Temp 98.1 F (36.7 C) (Oral)   Resp 16   Ht 5\' 6"  (1.676 m)   Wt 159 lb (72.1 kg)   SpO2 100%   BMI 25.66 kg/m    Objective:   Physical Exam  Constitutional: She is oriented to person, place, and time. She appears well-developed and well-nourished.  HENT:  Head: Normocephalic.  Cardiovascular: Normal rate, regular rhythm and normal heart sounds.   No murmur heard. Pulmonary/Chest: Effort normal and breath sounds normal. No respiratory distress. She has no wheezes.  Musculoskeletal: She exhibits no edema.       Cervical back: She exhibits no tenderness.  Thoracic back: She exhibits no tenderness.       Lumbar back: She exhibits no tenderness.  Neurological: She is alert and oriented to person, place, and time.  Reflex Scores:      Patellar reflexes are 2+ on the right side and 2+ on the left side. Bilateral LE strength is 5/5  Psychiatric: She has a normal mood and affect. Her behavior is normal. Judgment and thought content normal.          Assessment & Plan:  Low back pain/right leg pain- would avoid NSAIDS due to renal insufficiency. Advised heating pad bid and tylenol prn follow up if new/worsening symptoms or if symptoms do not improve.

## 2017-02-23 ENCOUNTER — Telehealth: Payer: Self-pay | Admitting: *Deleted

## 2017-02-23 NOTE — Telephone Encounter (Signed)
Left message for pt to return my call.

## 2017-02-23 NOTE — Telephone Encounter (Signed)
-----   Message from [name], NP sent at 02/22/2017  4:51 PM EST ----- Regarding: FW: Cholesterol is high. Please increase atorvastatin to 80 mg once daily #30, 2 refills. ----- Message ----- From: Interface, Lab In Three Zero One Sent: 02/22/2017   2:39 PM To: [name], NP

## 2017-02-25 MED ORDER — ATORVASTATIN CALCIUM 80 MG PO TABS: 80.0000 mg | ORAL_TABLET | Freq: Every day | ORAL | 2 refills | Status: DC

## 2017-02-25 NOTE — Telephone Encounter (Signed)
Notified pt and she voices understanding. Rx sent. 

## 2017-02-25 NOTE — Telephone Encounter (Signed)
Tried to reach pt. Left message to call back.

## 2017-02-25 NOTE — Addendum Note (Signed)
Addended by: Kelle Darting A on: 02/25/2017 02:53 PM   Modules accepted: Orders

## 2017-03-07 ENCOUNTER — Ambulatory Visit (INDEPENDENT_AMBULATORY_CARE_PROVIDER_SITE_OTHER): Payer: Medicare HMO | Admitting: Family

## 2017-03-07 ENCOUNTER — Encounter: Payer: Self-pay | Admitting: Family

## 2017-03-07 VITALS — BP 161/64 | HR 50 | Temp 98.1°F | Resp 18 | Ht 66.0 in | Wt 155.6 lb

## 2017-03-07 DIAGNOSIS — I10 Essential (primary) hypertension: Secondary | ICD-10-CM | POA: Diagnosis not present

## 2017-03-07 DIAGNOSIS — M545 Low back pain: Secondary | ICD-10-CM

## 2017-03-07 LAB — BASIC METABOLIC PANEL
BUN: 23 mg/dL (ref 6–23)
CALCIUM: 10.5 mg/dL (ref 8.4–10.5)
CO2: 31 meq/L (ref 19–32)
Chloride: 100 mEq/L (ref 96–112)
Creatinine, Ser: 1.41 mg/dL — ABNORMAL HIGH (ref 0.40–1.20)
GFR: 46.39 mL/min — ABNORMAL LOW (ref >60.00)
GLUCOSE: 89 mg/dL (ref 70–99)
Potassium: 5 mEq/L (ref 3.5–5.1)
Sodium: 141 mEq/L (ref 135–145)

## 2017-03-07 MED ORDER — LOSARTAN POTASSIUM 25 MG PO TABS: 25.0000 mg | ORAL_TABLET | Freq: Every day | ORAL | 1 refills | Status: DC

## 2017-03-07 MED ORDER — ATORVASTATIN CALCIUM 80 MG PO TABS: 80.0000 mg | ORAL_TABLET | Freq: Every day | ORAL | 1 refills | Status: DC

## 2017-03-07 NOTE — Patient Instructions (Addendum)
Please complete lab work prior to leaving. Add losartan 25mg  once daily.

## 2017-03-07 NOTE — Progress Notes (Signed)
Pre visit review using our clinic review tool, if applicable. No additional management support is needed unless otherwise documented below in the visit note.,  

## 2017-03-07 NOTE — Progress Notes (Signed)
Subjective:    Patient ID: Sherry Perez, female    DOB: 08-21-1941, 76 y.o.   MRN: [id number]  HPI  Sherry Perez is a 76 yr old female who presents today for follow up.  1) HTN- she is maintained on amlodipine and toprol xl. Last visit she was out of lasix and lasix was resumed.  BP Readings from Last 3 Encounters:  03/07/17 (!) 161/64  02/22/17 (!) 185/65  11/26/16 (!) 138/58   2) Back Pain-  Last visit we recommended heating pad, tylenol prn.  Reports overall pain is improving.  She is pleased with this.    Review of Systems See HPI  Past Medical History:  Diagnosis Date  . Diabetes mellitus without complication ([place])   . HTN (hypertension)   . Hyperlipidemia      Social History   Social History  . Marital status: Married    Spouse name: N/A  . Number of children: N/A  . Years of education: N/A   Occupational History  . Not on file.   Social History Main Topics  . Smoking status: Never Smoker  . Smokeless tobacco: Never Used  . Alcohol use No  . Drug use: Unknown  . Sexual activity: Not on file   Other Topics Concern  . 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 ABDOMINAL HYSTERECTOMY      Family History  Problem Relation Age of Onset  . Diabetes Mother     Allergies  Allergen Reactions  . Lisinopril     Renal insufficiency    Current Outpatient Prescriptions on File Prior to Visit  Medication Sig Dispense Refill  . acetaminophen (TYLENOL) 500 MG tablet Take 2 tablets (1,000 mg total) by mouth 2 (two) times daily. 30 tablet 0  . amLODipine (NORVASC) 10 MG tablet Take 1 tablet (10 mg total) by mouth daily. 90 tablet 1  . aspirin 81 MG tablet Take 81 mg by mouth daily.      Marland Kitchen atorvastatin (LIPITOR) 80 MG tablet Take 1 tablet (80 mg total) by mouth daily. 30 tablet 2  . cholecalciferol (VITAMIN D) 1000 units tablet Take 1,000 Units by mouth daily.    . furosemide (LASIX) 40 MG tablet Take 1 tablet (40 mg total) by mouth daily. 90 tablet 1  . metoprolol succinate (TOPROL-XL) 50 MG 24 hr tablet TAKE ONE TABLET BY MOUTH ONE TIME DAILY. TAKE WITH/OR IMMEDIATELY FOLLOWING A MEAL 90 tablet 1  . potassium chloride SA (K-DUR,KLOR-CON) 20 MEQ tablet Take 0.5 tablets (10 mEq total) by mouth daily. 90 tablet 1   No current facility-administered medications on file prior to visit.     BP (!) 161/64 (BP Location: Right Arm, Cuff Size: Normal)   Pulse (!) 50   Temp 98.1 F (36.7 C) (Oral)   Resp 18   Ht 5\' 6"  (1.676 m)   Wt 155 lb 9.6 oz (70.6 kg)   SpO2 100% Comment: room air  BMI 25.11 kg/m       Objective:   Physical Exam  Constitutional: She is oriented to person, place, and time. She appears well-developed and well-nourished.  HENT:  Head:  Normocephalic and atraumatic.  Cardiovascular: Normal rate, regular rhythm and normal heart sounds.   No murmur heard. Pulmonary/Chest: Effort normal and breath sounds normal. No respiratory distress. She has no wheezes.  Musculoskeletal: She exhibits no edema.  Neurological: She is alert and oriented to person, place, and time.  Skin: Skin is warm and dry.  Psychiatric: She has a normal mood and affect. Her behavior is normal. Judgment and thought content normal.          Assessment & Plan:  Back pain- improved.  Monitor.

## 2017-03-07 NOTE — Assessment & Plan Note (Addendum)
BP is improved but still above goal.  Will add losartan with careful monitoring of her potassium and renal function.  Will plan follow up in 2 weeks for nurse visit and follow up bmet.

## 2017-03-08 ENCOUNTER — Ambulatory Visit: Payer: PRIVATE HEALTH INSURANCE | Admitting: Family

## 2017-03-08 ENCOUNTER — Encounter: Payer: Self-pay | Admitting: Family

## 2017-03-22 ENCOUNTER — Ambulatory Visit (INDEPENDENT_AMBULATORY_CARE_PROVIDER_SITE_OTHER): Payer: Medicare HMO | Admitting: Internal Medicine

## 2017-03-22 ENCOUNTER — Other Ambulatory Visit: Payer: Self-pay | Admitting: Family

## 2017-03-22 ENCOUNTER — Other Ambulatory Visit (INDEPENDENT_AMBULATORY_CARE_PROVIDER_SITE_OTHER): Payer: Medicare HMO

## 2017-03-22 VITALS — BP 139/58 | HR 57

## 2017-03-22 DIAGNOSIS — I10 Essential (primary) hypertension: Secondary | ICD-10-CM

## 2017-03-22 NOTE — Progress Notes (Signed)
Pre visit review using our clinic review tool, if applicable. No additional management support is needed unless otherwise documented below in the visit note.  Patient presents in clinic today for blood pressure check per OV note 03/07/17. Reviewed medications & regimen with the patient. She reported compliance with all medications. Readings during visit was BP 139/58 P 57 02 100%. Patient is asymptomatic.  Per Dr. Larose Kells: Continue current medications & regimen. Complete BMP (Basic Metabolic Panel) lab work today. Return in 3 months for an office visit with PCP.  Informed patient of the provider's recommendations. She verbalized understanding. Next appointment scheduled for 06/21/17 at 9:45 AM.  Kathlene November, MD

## 2017-03-22 NOTE — Patient Instructions (Signed)
Per Dr. Larose Kells: Continue current medications & regimen. Complete BMP (Basic Metabolic Panel) lab work today. Return in 3 months for an office visit with PCP.

## 2017-03-23 LAB — BASIC METABOLIC PANEL
BUN: 23 mg/dL (ref 6–23)
CALCIUM: 9.5 mg/dL (ref 8.4–10.5)
CO2: 29 mEq/L (ref 19–32)
CREATININE: 1.31 mg/dL — AB (ref 0.40–1.20)
Chloride: 100 mEq/L (ref 96–112)
GFR: 50.5 mL/min — AB (ref >60.00)
GLUCOSE: 90 mg/dL (ref 70–99)
Potassium: 3.8 mEq/L (ref 3.5–5.1)
Sodium: 138 mEq/L (ref 135–145)

## 2017-03-25 ENCOUNTER — Encounter: Payer: Self-pay | Admitting: Family

## 2017-03-25 NOTE — Telephone Encounter (Signed)
I'm guessing she means March 20 , since April 20 , 2018 has not come yet----- her kidney function was abnormal so she should stay off of it indefinitely-- if she needs something for pain she can take tylenol --- and discuss with Melissa at next [facility]

## 2017-04-12 ENCOUNTER — Telehealth: Payer: Self-pay | Admitting: *Deleted

## 2017-04-12 NOTE — Telephone Encounter (Addendum)
Received mail from [employer] requesting patient Medical Records [from 02/21/15 to 03/23/17] with signed release for claim to determine eligibility for benefits. Forwarded to [place] for email and/or scan/SLS 04/24

## 2017-04-22 ENCOUNTER — Other Ambulatory Visit: Payer: Self-pay | Admitting: Family

## 2017-04-26 ENCOUNTER — Ambulatory Visit: Payer: Commercial Managed Care - HMO | Admitting: *Deleted

## 2017-04-26 NOTE — Telephone Encounter (Signed)
Noted  

## 2017-04-26 NOTE — Telephone Encounter (Signed)
eScribe request from [facility] for refill on Losartan 25 mg tablet Last filled - 03/07/17, #30x1 Last AEX - 03/22/17 [HTN] Next AEX - 3-Mths.  Patient had Lisinopril added back to medication list in Refill Encounter [this done in error 03/22/17] pre-office visit; Lisinopril had been d/c from patient's medication list since 10/26/16, due to Renal Insufficiency and/or worsening kidney function per PCP. Drug-Drug Interaction pop-up when attempting to fill Losartan request d/t lisinopril with renal warnings. Reviewed chart and found that this was refilled in error; phoned pharmacist at [facility] to verify that pt did not pick-up this medication up [on 03/22/17 authorization], patient did not p/u and Rx has been canceled from pt's medication list in  EMR and at pharmacy/SLS 05/08

## 2017-06-17 ENCOUNTER — Other Ambulatory Visit: Payer: Self-pay | Admitting: Family

## 2017-06-21 ENCOUNTER — Encounter: Payer: Self-pay | Admitting: Family

## 2017-06-21 ENCOUNTER — Ambulatory Visit (INDEPENDENT_AMBULATORY_CARE_PROVIDER_SITE_OTHER): Payer: Medicare HMO | Admitting: Family

## 2017-06-21 VITALS — BP 163/68 | HR 50 | Temp 98.0°F | Resp 16 | Ht 66.0 in | Wt 165.4 lb

## 2017-06-21 DIAGNOSIS — R011 Cardiac murmur, unspecified: Secondary | ICD-10-CM | POA: Diagnosis not present

## 2017-06-21 DIAGNOSIS — I10 Essential (primary) hypertension: Secondary | ICD-10-CM

## 2017-06-21 DIAGNOSIS — E119 Type 2 diabetes mellitus without complications: Secondary | ICD-10-CM | POA: Diagnosis not present

## 2017-06-21 MED ORDER — LOSARTAN POTASSIUM 50 MG PO TABS: 50.0000 mg | ORAL_TABLET | Freq: Every day | ORAL | 3 refills | Status: DC

## 2017-06-21 NOTE — Patient Instructions (Signed)
Please increase losartan from 25mg  to 50mg .  You will be contacted about your referral to opthalmology and about the ultrasound of your echocardiogram.

## 2017-06-21 NOTE — Progress Notes (Signed)
Subjective:    Patient ID: Sherry Perez, female    DOB: 1941/02/27, 76 y.o.   MRN: [id number]  HPI  Ms. Acero is a 76 yr old female who presents today for follow up of her hypertension. Last visit we added losartan to her regimen and follow up blood pressure looked better. Denies CP. Does report some LE edema.   BP Readings from Last 3 Encounters:  06/21/17 (!) 163/68  03/22/17 (!) 139/58  03/07/17 (!) 161/64    DM2- diet controlled. Due for DM eye exam.  Lab Results  Component Value Date   HGBA1C 5.6 02/22/2017   HGBA1C 5.7 09/13/2016   HGBA1C 5.3 06/07/2016   Lab Results  Component Value Date   MICROALBUR 0.7 06/07/2016   LDLCALC 121 (H) 02/22/2017   CREATININE 1.31 (H) 03/22/2017      Review of Systems    see HPI  Past Medical History:  Diagnosis Date  . Diabetes mellitus without complication ([place])   . HTN (hypertension)   . Hyperlipidemia      Social History   Social History  . Marital status: Married    Spouse name: N/A  . Number of children: N/A  . Years of education: N/A   Occupational History  . Not on file.   Social History Main Topics  . Smoking status: Never Smoker  . Smokeless tobacco: Never Used  . Alcohol use No  . Drug use: Unknown  . Sexual activity: Not on file   Other Topics Concern  . 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 ABDOMINAL HYSTERECTOMY      Family History  Problem Relation Age of Onset  . Diabetes Mother     Allergies  Allergen Reactions  . Lisinopril     Renal insufficiency    Current Outpatient Prescriptions on File Prior to Visit  Medication Sig Dispense Refill  . acetaminophen (TYLENOL) 500 MG tablet Take 2 tablets  (1,000 mg total) by mouth 2 (two) times daily. 30 tablet 0  . amLODipine (NORVASC) 10 MG tablet Take 1 tablet (10 mg total) by mouth daily. 90 tablet 1  . aspirin 81 MG tablet Take 81 mg by mouth daily.    Marland Kitchen atorvastatin (LIPITOR) 80 MG tablet Take 1 tablet (80 mg total) by mouth daily. 90 tablet 1  . cholecalciferol (VITAMIN D) 1000 units tablet Take 1,000 Units by mouth daily.    . furosemide (LASIX) 40 MG tablet Take 1 tablet (40 mg total) by mouth daily. 90 tablet 1  . losartan (COZAAR) 25 MG tablet TAKE ONE TABLET BY MOUTH ONE TIME DAILY 30 tablet 1  . metoprolol succinate (TOPROL-XL) 50 MG 24 hr tablet TAKE ONE TABLET BY MOUTH ONE TIME DAILY. TAKE WITH/OR IMMEDIATELY FOLLOWING A MEAL 90 tablet 1  . potassium chloride SA (K-DUR,KLOR-CON) 20 MEQ tablet Take 0.5 tablets (10 mEq total) by mouth daily. 90 tablet 1   No current facility-administered medications on file prior to visit.     BP (!) 163/68 (BP Location: Left Arm, Cuff Size: Normal)   Pulse (!) 50   Temp 98 F (36.7 C) (Oral)   Resp  16   Ht 5\' 6"  (1.676 m)   Wt 165 lb 6.4 oz (75 kg)   SpO2 100%   BMI 26.70 kg/m    Objective:   Physical Exam  Constitutional: She is oriented to person, place, and time. She appears well-developed and well-nourished.  Cardiovascular: Normal rate and regular rhythm.   Murmur heard. Pulmonary/Chest: Effort normal and breath sounds normal. No respiratory distress. She has no wheezes.  Musculoskeletal:  1+ bilateral LE edema  Neurological: She is alert and oriented to person, place, and time.  Psychiatric: She has a normal mood and affect. Her behavior is normal. Judgment and thought content normal.          Assessment & Plan:  HTN- uncontrolled. Increase losartan from 25mg  to 50mg . Continue amlodipine, follow up in 1 month.  DM2- controlled. Continue diabetic diet, refer for DM eye exam.   Murmur- new- refer for 2D echo for further evaluation.

## 2017-06-30 ENCOUNTER — Telehealth: Payer: Self-pay | Admitting: Family

## 2017-06-30 ENCOUNTER — Ambulatory Visit (HOSPITAL_BASED_OUTPATIENT_CLINIC_OR_DEPARTMENT_OTHER): Admission: RE | Admit: 2017-06-30 | Payer: Medicare HMO | Source: Ambulatory Visit

## 2017-06-30 NOTE — Telephone Encounter (Signed)
-----   Message from Katha Hamming sent at 06/30/2017  2:55 PM EDT ----- Regarding: echo Sherry Perez no showed for her echocardiogram today.  Thanks, Hoyle Sauer

## 2017-06-30 NOTE — Telephone Encounter (Signed)
Left message for pt to return my call.

## 2017-07-06 NOTE — Telephone Encounter (Signed)
Mailed letter to pt to call and reschedule echo.

## 2017-07-09 DIAGNOSIS — G3184 Mild cognitive impairment, so stated: Secondary | ICD-10-CM | POA: Diagnosis not present

## 2017-07-09 DIAGNOSIS — Z6827 Body mass index (BMI) 27.0-27.9, adult: Secondary | ICD-10-CM | POA: Diagnosis not present

## 2017-07-09 DIAGNOSIS — E663 Overweight: Secondary | ICD-10-CM | POA: Diagnosis not present

## 2017-07-09 DIAGNOSIS — M150 Primary generalized (osteo)arthritis: Secondary | ICD-10-CM | POA: Diagnosis not present

## 2017-07-09 DIAGNOSIS — I10 Essential (primary) hypertension: Secondary | ICD-10-CM | POA: Diagnosis not present

## 2017-07-09 DIAGNOSIS — Z0000 Encounter for general adult medical examination without abnormal findings: Secondary | ICD-10-CM | POA: Diagnosis not present

## 2017-07-09 DIAGNOSIS — E785 Hyperlipidemia, unspecified: Secondary | ICD-10-CM | POA: Diagnosis not present

## 2017-07-09 DIAGNOSIS — K08109 Complete loss of teeth, unspecified cause, unspecified class: Secondary | ICD-10-CM | POA: Diagnosis not present

## 2017-07-09 DIAGNOSIS — R600 Localized edema: Secondary | ICD-10-CM | POA: Diagnosis not present

## 2017-07-09 DIAGNOSIS — H259 Unspecified age-related cataract: Secondary | ICD-10-CM | POA: Diagnosis not present

## 2017-07-21 ENCOUNTER — Ambulatory Visit (HOSPITAL_BASED_OUTPATIENT_CLINIC_OR_DEPARTMENT_OTHER)
Admission: RE | Admit: 2017-07-21 | Discharge: 2017-07-21 | Disposition: A | Discharge status: Home / Self Care | Payer: Medicare HMO | Source: Ambulatory Visit | Attending: Family | Admitting: Family

## 2017-07-21 DIAGNOSIS — I051 Rheumatic mitral insufficiency: Secondary | ICD-10-CM | POA: Insufficient documentation

## 2017-07-21 DIAGNOSIS — R011 Cardiac murmur, unspecified: Secondary | ICD-10-CM | POA: Diagnosis not present

## 2017-07-21 LAB — ECHOCARDIOGRAM COMPLETE
AOASC: 29 cm
AVLVOTPG: 5 mmHg
CHL CUP DOP CALC LVOT VTI: 26.9 cm
CHL CUP TV REG PEAK VELOCITY: 280 cm/s
E/e' ratio: 9.78
EWDT: 185 ms
FS: 44 % (ref 28–44)
IV/PV OW: 1.47
LA diam index: 1.59 cm/m2
LA vol A4C: 76.7 ml
LA vol: 63.8 mL
LASIZE: 30 mm
LAVOLIN: 33.9 mL/m2
LDCA: 2.27 cm2
LEFT ATRIUM END SYS DIAM: 30 mm
LV E/e' medial: 9.78
LV PW d: 8.62 mm — AB (ref 0.6–1.1)
LV TDI E'LATERAL: 8.59
LVDIAVOL: 66 mL (ref 46–106)
LVDIAVOLIN: 35 mL/m2
LVEEAVG: 9.78
LVELAT: 8.59 cm/s
LVOTD: 17 mm
LVOTPV: 113 cm/s
LVOTSV: 61 mL
Lateral S' vel: 9.57 cm/s
MV Dec: 185
MV Peak grad: 3 mmHg
MV pk E vel: 84 m/s
MVPKAVEL: 82.5 m/s
TAPSE: 28.5 mm
TDI e' medial: 5.87
TR max vel: 280 cm/s

## 2017-07-21 NOTE — Progress Notes (Signed)
  Echocardiogram 2D Echocardiogram has been performed.  Sherry Perez 07/21/2017, 2:44 PM

## 2017-07-27 ENCOUNTER — Encounter: Payer: Self-pay | Admitting: Family

## 2017-07-27 ENCOUNTER — Ambulatory Visit (INDEPENDENT_AMBULATORY_CARE_PROVIDER_SITE_OTHER): Payer: Medicare HMO | Admitting: Family

## 2017-07-27 VITALS — BP 144/50 | HR 46 | Temp 98.5°F | Ht 66.0 in | Wt 169.8 lb

## 2017-07-27 DIAGNOSIS — I10 Essential (primary) hypertension: Secondary | ICD-10-CM | POA: Diagnosis not present

## 2017-07-27 LAB — BASIC METABOLIC PANEL
BUN: 24 mg/dL — AB (ref 6–23)
CHLORIDE: 105 meq/L (ref 96–112)
CO2: 31 mEq/L (ref 19–32)
CREATININE: 1.55 mg/dL — AB (ref 0.40–1.20)
Calcium: 8.9 mg/dL (ref 8.4–10.5)
GFR: 41.55 mL/min — ABNORMAL LOW (ref >60.00)
GLUCOSE: 89 mg/dL (ref 70–99)
Potassium: 4.1 mEq/L (ref 3.5–5.1)
Sodium: 140 mEq/L (ref 135–145)

## 2017-07-27 NOTE — Progress Notes (Signed)
Pre visit review using our clinic tool,if applicable. No additional management support is needed unless otherwise documented below in the visit note.  

## 2017-07-27 NOTE — Progress Notes (Signed)
Subjective:    Patient ID: Sherry Perez, female    DOB: Nov 29, 1941, 76 y.o.   MRN: [id number]  HPI  Sherry Perez is a 76 yr old female who presents today for follow up. Last visit we increased her losartan from 25mg  to 50 mg. Denies CP/SOB. Notes occasional swelling.  Had Echo done last week which showed normal heart function.   BP Readings from Last 3 Encounters:  07/27/17 (!) 144/50  06/21/17 (!) 163/68  03/22/17 (!) 139/58     Review of Systems See HPI  Past Medical History:  Diagnosis Date  . Diabetes mellitus without complication ([place])   . HTN (hypertension)   . Hyperlipidemia      Social History   Social History  . Marital status: Married    Spouse name: N/A  . Number of children: N/A  . Years of education: N/A   Occupational History  . Not on file.   Social History Main Topics  . Smoking status: Never Smoker  . Smokeless tobacco: Never Used  . Alcohol use No  . Drug use: Unknown  . Sexual activity: Not on file   Other Topics Concern  . 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 ABDOMINAL HYSTERECTOMY      Family History  Problem Relation Age of Onset  . Diabetes Mother     Allergies  Allergen Reactions  . Lisinopril     Renal insufficiency    Current Outpatient Prescriptions on File Prior to Visit  Medication Sig Dispense Refill  . acetaminophen (TYLENOL) 500 MG tablet Take 2 tablets (1,000 mg total) by mouth 2 (two) times daily. 30 tablet 0  . amLODipine (NORVASC) 10 MG tablet Take 1 tablet (10 mg total) by mouth daily. 90 tablet 1  . aspirin 81 MG tablet Take 81 mg by mouth daily.    Marland Kitchen atorvastatin (LIPITOR) 80 MG tablet Take 1 tablet (80 mg total) by mouth  daily. 90 tablet 1  . cholecalciferol (VITAMIN D) 1000 units tablet Take 1,000 Units by mouth daily.    . furosemide (LASIX) 40 MG tablet Take 1 tablet (40 mg total) by mouth daily. 90 tablet 1  . losartan (COZAAR) 50 MG tablet Take 1 tablet (50 mg total) by mouth daily. 30 tablet 3  . metoprolol succinate (TOPROL-XL) 50 MG 24 hr tablet TAKE ONE TABLET BY MOUTH ONE TIME DAILY. TAKE WITH/OR IMMEDIATELY FOLLOWING A MEAL 90 tablet 1  . potassium chloride SA (K-DUR,KLOR-CON) 20 MEQ tablet Take 0.5 tablets (10 mEq total) by mouth daily. 90 tablet 1   No current facility-administered medications on file prior to visit.     BP (!) 144/50   Pulse (!) 46   Temp 98.5 F (36.9 C) (Oral)   Ht 5\' 6"  (1.676 m)   Wt 169 lb 12.8 oz (77 kg)   SpO2 100%   BMI 27.41 kg/m       Objective:   Physical Exam  Constitutional: She appears well-developed and well-nourished.  Cardiovascular: Normal rate, regular rhythm and normal heart sounds.   No murmur heard. Pulmonary/Chest: Effort normal and breath sounds normal. No respiratory distress.  She has no wheezes.  Musculoskeletal:  1-2+ bilateral LE edema  Psychiatric: She has a normal mood and affect. Her behavior is normal. Judgment and thought content normal.          Assessment & Plan:  HTN- BP improved.  Advised pt as follows:  Please complete lab work prior to leaving. Continue current dose of losartan. Follow up in 3 months.

## 2017-07-27 NOTE — Patient Instructions (Signed)
Please complete lab work prior to leaving. Continue current dose of losartan.

## 2017-07-27 NOTE — Progress Notes (Signed)
A 

## 2017-07-29 ENCOUNTER — Ambulatory Visit: Payer: Medicare HMO | Admitting: Family

## 2017-07-31 ENCOUNTER — Telehealth: Payer: Self-pay | Admitting: Family

## 2017-07-31 DIAGNOSIS — N289 Disorder of kidney and ureter, unspecified: Secondary | ICD-10-CM

## 2017-07-31 NOTE — Telephone Encounter (Signed)
Please let patient know that  renal function has worsened slightly. Please confirm that she is not taking any NSAIDs.  I would like her to complete a renal ultrasound.

## 2017-08-02 NOTE — Telephone Encounter (Signed)
Left message for pt to return my call.

## 2017-08-05 NOTE — Telephone Encounter (Signed)
Yes please

## 2017-08-05 NOTE — Telephone Encounter (Signed)
Notified pt's daughter and she states pt has been taking aleve and ibuprofen. She will stop and have pt to try tylenol instead. Does pt still need to proceed with ultrasound?

## 2017-08-05 NOTE — Telephone Encounter (Signed)
Notified pt's daughter and transferred her to radiology to schedule u/s.

## 2017-08-06 ENCOUNTER — Ambulatory Visit (HOSPITAL_BASED_OUTPATIENT_CLINIC_OR_DEPARTMENT_OTHER)
Admission: RE | Admit: 2017-08-06 | Discharge: 2017-08-06 | Disposition: A | Discharge status: Home / Self Care | Payer: Medicare HMO | Source: Ambulatory Visit | Attending: Family | Admitting: Family

## 2017-08-06 DIAGNOSIS — N289 Disorder of kidney and ureter, unspecified: Secondary | ICD-10-CM | POA: Insufficient documentation

## 2017-08-06 DIAGNOSIS — N2889 Other specified disorders of kidney and ureter: Secondary | ICD-10-CM | POA: Diagnosis not present

## 2017-08-14 ENCOUNTER — Other Ambulatory Visit: Payer: Self-pay | Admitting: Family

## 2017-08-15 MED ORDER — LOSARTAN POTASSIUM 50 MG PO TABS: 50.0000 mg | ORAL_TABLET | Freq: Every day | ORAL | 1 refills | Status: DC

## 2017-08-19 ENCOUNTER — Telehealth: Payer: Self-pay | Admitting: *Deleted

## 2017-08-19 NOTE — Telephone Encounter (Signed)
Received request for Medical Records from [facility]; forwarded to [place] for  08/31

## 2017-09-11 DIAGNOSIS — X509XXA Other and unspecified overexertion or strenuous movements or postures, initial encounter: Secondary | ICD-10-CM | POA: Diagnosis not present

## 2017-09-11 DIAGNOSIS — M5030 Other cervical disc degeneration, unspecified cervical region: Secondary | ICD-10-CM | POA: Diagnosis not present

## 2017-09-11 DIAGNOSIS — I10 Essential (primary) hypertension: Secondary | ICD-10-CM | POA: Diagnosis not present

## 2017-09-11 DIAGNOSIS — M47812 Spondylosis without myelopathy or radiculopathy, cervical region: Secondary | ICD-10-CM | POA: Diagnosis not present

## 2017-09-11 DIAGNOSIS — Y9222 Religious institution as the place of occurrence of the external cause: Secondary | ICD-10-CM | POA: Diagnosis not present

## 2017-09-11 DIAGNOSIS — M4802 Spinal stenosis, cervical region: Secondary | ICD-10-CM | POA: Diagnosis not present

## 2017-09-11 DIAGNOSIS — E119 Type 2 diabetes mellitus without complications: Secondary | ICD-10-CM | POA: Diagnosis not present

## 2017-09-11 DIAGNOSIS — M9971 Connective tissue and disc stenosis of intervertebral foramina of cervical region: Secondary | ICD-10-CM | POA: Diagnosis not present

## 2017-09-11 DIAGNOSIS — M542 Cervicalgia: Secondary | ICD-10-CM | POA: Diagnosis not present

## 2017-09-11 DIAGNOSIS — Y998 Other external cause status: Secondary | ICD-10-CM | POA: Diagnosis not present

## 2017-10-28 ENCOUNTER — Ambulatory Visit (INDEPENDENT_AMBULATORY_CARE_PROVIDER_SITE_OTHER): Payer: Medicare HMO | Admitting: Family

## 2017-10-28 ENCOUNTER — Encounter: Payer: Self-pay | Admitting: Family

## 2017-10-28 VITALS — BP 146/62 | HR 69 | Temp 98.4°F | Resp 16 | Ht 66.0 in | Wt 171.0 lb

## 2017-10-28 DIAGNOSIS — R779 Abnormality of plasma protein, unspecified: Secondary | ICD-10-CM | POA: Diagnosis not present

## 2017-10-28 DIAGNOSIS — B351 Tinea unguium: Secondary | ICD-10-CM

## 2017-10-28 DIAGNOSIS — I10 Essential (primary) hypertension: Secondary | ICD-10-CM

## 2017-10-28 DIAGNOSIS — Z23 Encounter for immunization: Secondary | ICD-10-CM

## 2017-10-28 DIAGNOSIS — E1122 Type 2 diabetes mellitus with diabetic chronic kidney disease: Secondary | ICD-10-CM

## 2017-10-28 DIAGNOSIS — E785 Hyperlipidemia, unspecified: Secondary | ICD-10-CM | POA: Diagnosis not present

## 2017-10-28 DIAGNOSIS — M25511 Pain in right shoulder: Secondary | ICD-10-CM | POA: Diagnosis not present

## 2017-10-28 MED ORDER — POTASSIUM CHLORIDE CRYS ER 20 MEQ PO TBCR: 10.0000 meq | EXTENDED_RELEASE_TABLET | Freq: Every day | ORAL | 1 refills | Status: DC

## 2017-10-28 MED ORDER — ATORVASTATIN CALCIUM 80 MG PO TABS: 80.0000 mg | ORAL_TABLET | Freq: Every day | ORAL | 1 refills | Status: DC

## 2017-10-28 NOTE — Patient Instructions (Addendum)
Restart losartan, you do not need to restart metoprolol. Complete lab work prior to leaving. You will be contacted about your referral to Dr. Barbaraann Barthel (sports medicine) for your left shoulder pain.

## 2017-10-28 NOTE — Progress Notes (Signed)
Subjective:    Patient ID: Sherry Perez, female    DOB: [birth year], 76 y.o.   MRN: [id number]  HPI  Sherry Perez is a 76 yr old female who presents today for follow up of her hypertension.  HTN-  BP Readings from Last 3 Encounters:  10/28/17 (!) 146/62  07/27/17 (!) 144/50  06/21/17 (!) 163/68   DM2-  Lab Results  Component Value Date   HGBA1C 5.6 02/22/2017   HGBA1C 5.7 09/13/2016   HGBA1C 5.3 06/07/2016   Lab Results  Component Value Date   MICROALBUR 0.7 06/07/2016   LDLCALC 121 (H) 02/22/2017   CREATININE 1.55 (H) 07/27/2017   Arm Pain- reports right upper arm pain x 1-2 weeks.  No known injury.       Review of Systems See HPI  Past Medical History:  Diagnosis Date  . Diabetes mellitus without complication ([place])   . HTN (hypertension)   . Hyperlipidemia      Social History   Socioeconomic History  . Marital status: Married    Spouse name: Not on file  . Number of children: Not on file  . Years of education: Not on file  . Highest education level: Not on file  Social Needs  . Financial resource strain: Not on file  . Food insecurity - worry: Not on file  . Food insecurity - inability: Not on file  . Transportation needs - medical: Not on file  . Transportation needs - non-medical: Not on file  Occupational History  . Not on file  Tobacco Use  . Smoking status: Never Smoker  . Smokeless tobacco: Never Used  Substance and Sexual Activity  . Alcohol use: No    Alcohol/week: 0.0 oz  . Drug use: Not on file  . Sexual activity: Not on file  Other Topics Concern  . 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 ABDOMINAL HYSTERECTOMY      Family History  Problem Relation Age of  Onset  . Diabetes Mother     Allergies  Allergen Reactions  . Lisinopril     Renal insufficiency    Current Outpatient Medications on File Prior to Visit  Medication Sig Dispense Refill  . acetaminophen (TYLENOL) 500 MG tablet Take 2 tablets (1,000 mg total) by mouth 2 (two) times daily. 30 tablet 0  . amLODipine (NORVASC) 10 MG tablet TAKE ONE TABLET BY MOUTH ONE TIME DAILY 90 tablet 1  . aspirin 81 MG tablet Take 81 mg by mouth daily.    . cholecalciferol (VITAMIN D) 1000 units tablet Take 1,000 Units by mouth daily.    . furosemide (LASIX) 40 MG tablet TAKE ONE TABLET BY MOUTH ONE TIME DAILY 90 tablet 1  . atorvastatin (LIPITOR) 80 MG tablet Take 1 tablet (80 mg total) by mouth daily. (Patient not taking: Reported on 10/28/2017) 90 tablet 1  . losartan (COZAAR) 50 MG tablet Take 1 tablet (50 mg total) by mouth daily. (Patient not taking: Reported on 10/28/2017) 90 tablet 1  . metoprolol succinate (TOPROL-XL) 50 MG 24 hr tablet TAKE ONE TABLET BY MOUTH ONE TIME DAILY. TAKE WITH/OR IMMEDIATELY FOLLOWING A MEAL (Patient not taking: Reported  on 10/28/2017) 90 tablet 1  . potassium chloride SA (K-DUR,KLOR-CON) 20 MEQ tablet Take 0.5 tablets (10 mEq total) by mouth daily. (Patient not taking: Reported on 10/28/2017) 90 tablet 1   No current facility-administered medications on file prior to visit.     BP (!) 146/62 (BP Location: Right Arm, Cuff Size: Normal)   Pulse 69   Temp 98.4 F (36.9 C) (Oral)   Resp 16   Ht 5\' 6"  (1.676 m)   Wt 171 lb (77.6 kg)   SpO2 100%   BMI 27.60 kg/m       Objective:   Physical Exam  Constitutional: She is oriented to person, place, and time. She appears well-developed and well-nourished.  Cardiovascular: Normal rate, regular rhythm and normal heart sounds.  No murmur heard. Pulmonary/Chest: Effort normal and breath sounds normal. No respiratory distress. She has no wheezes.  Musculoskeletal: She exhibits no edema.  Neurological: She is alert and  oriented to person, place, and time.  Skin: Skin is warm and dry.  Psychiatric: She has a normal mood and affect. Her behavior is normal. Judgment and thought content normal.  skin: hypertrophic long toenails R shoulder- no tenderness, unable to abduct right arm.       Assessment & Plan:  DM2- controlled- continue diabetic diet. Declines referral for eye exam due to cost.  HTN- looks ok today. Reports not taking arb or beta blocker or kdur for several months. Restart arb for renal protection, no need to restart beta blocker at this time. Restart Kdur.  R shoulder pain- suspect RTC involvement. Refer to sports med for further evaluation.  Onychomycosis- refer to podiatry for debridement of toenails.

## 2017-11-02 ENCOUNTER — Ambulatory Visit (INDEPENDENT_AMBULATORY_CARE_PROVIDER_SITE_OTHER): Payer: Medicare HMO | Admitting: Family Medicine

## 2017-11-02 ENCOUNTER — Encounter: Payer: Self-pay | Admitting: Family Medicine

## 2017-11-02 DIAGNOSIS — M25511 Pain in right shoulder: Secondary | ICD-10-CM

## 2017-11-02 DIAGNOSIS — G8929 Other chronic pain: Secondary | ICD-10-CM | POA: Diagnosis not present

## 2017-11-02 MED ORDER — MELOXICAM 7.5 MG PO TABS: 7.5000 mg | ORAL_TABLET | Freq: Every day | ORAL | 1 refills | Status: DC

## 2017-11-02 NOTE — Patient Instructions (Addendum)
You have rotator cuff impingement and a developing frozen shoulder (adhesive capsulitis), a buildup of scar tissue that limits motion of the shoulder joint. Limit lifting and overhead activities as much as possible. Ice or Heat 15 minutes at a time 3-4 times a day may help with movement and stiffness. Mobic 7.5mg  daily with food for pain and inflammation. Ok to take tylenol with this up to three times a day for pain. Steroid injections in a series have been shown to help with pain and motion - call me if you want to try this if you're not improving as much as expected. Codman exercises (pendulum, wall walking or table slides, arm circles) - do 3 sets of 10 once or twice a day. Physical therapy for rotator cuff strengthening is a consideration once you are out of the painful phase Follow up in 6 weeks if you're doing well.

## 2017-11-07 ENCOUNTER — Encounter: Payer: Self-pay | Admitting: Family Medicine

## 2017-11-07 DIAGNOSIS — M25511 Pain in right shoulder: Secondary | ICD-10-CM | POA: Insufficient documentation

## 2017-11-07 LAB — COMPREHENSIVE METABOLIC PANEL
AG Ratio: 1.2 (calc) (ref 1.0–2.5)
ALBUMIN MSPROF: 4.5 g/dL (ref 3.6–5.1)
ALT: 18 U/L (ref 6–29)
AST: 26 U/L (ref 10–35)
Alkaline phosphatase (APISO): 87 U/L (ref 33–130)
BUN / CREAT RATIO: 15 (calc) (ref 6–22)
BUN: 21 mg/dL (ref 7–25)
CO2: 25 mmol/L (ref 20–32)
CREATININE: 1.4 mg/dL — AB (ref 0.60–0.93)
Calcium: 9.6 mg/dL (ref 8.6–10.4)
Chloride: 100 mmol/L (ref 98–110)
GLOBULIN: 3.9 g/dL — AB (ref 1.9–3.7)
Glucose, Bld: 91 mg/dL (ref 65–99)
POTASSIUM: 4.2 mmol/L (ref 3.5–5.3)
SODIUM: 139 mmol/L (ref 135–146)
TOTAL PROTEIN: 8.4 g/dL — AB (ref 6.1–8.1)
Total Bilirubin: 0.5 mg/dL (ref 0.2–1.2)

## 2017-11-07 LAB — PROTEIN ELECTROPHORESIS, SERUM
ALPHA 2: 1 g/dL — AB (ref 0.5–0.9)
Albumin ELP: 4 g/dL (ref 3.8–4.8)
Alpha 1: 0.4 g/dL — ABNORMAL HIGH (ref 0.2–0.3)
Beta 2: 0.5 g/dL (ref 0.2–0.5)
Beta Globulin: 0.6 g/dL (ref 0.4–0.6)
Gamma Globulin: 1.3 g/dL (ref 0.8–1.7)
TOTAL PROTEIN: 7.9 g/dL (ref 6.1–8.1)

## 2017-11-07 LAB — LIPID PANEL
CHOL/HDL RATIO: 1.7 (calc) (ref <5.0)
Cholesterol: 232 mg/dL — ABNORMAL HIGH (ref <200)
HDL: 140 mg/dL (ref >50)
LDL Cholesterol (Calc): 79 mg/dL (calc)
NON-HDL CHOLESTEROL (CALC): 92 mg/dL (ref <130)
Triglycerides: 57 mg/dL (ref <150)

## 2017-11-07 LAB — TEST AUTHORIZATION 2

## 2017-11-07 LAB — HEMOGLOBIN A1C
HEMOGLOBIN A1C: 5.9 %{Hb} — AB (ref <5.7)
MEAN PLASMA GLUCOSE: 123 (calc)
eAG (mmol/L): 6.8 (calc)

## 2017-11-07 NOTE — Progress Notes (Signed)
PCP and consultation requested by: [name], NP  Subjective:   HPI: Patient is a 76 y.o. female here for right shoulder pain.  Patient reports about 2 weeks ago her right shoulder just started hurting without injury or trauma. Pain level up to 5/10 and sharp laterally, radiates down to elbow. + night pain. Bothers with dressing also. Not tried anything for this. No skin changes, numbness. Right handed.  Past Medical History:  Diagnosis Date  . Diabetes mellitus without complication ([place])   . HTN (hypertension)   . Hyperlipidemia     Current Outpatient Medications on File Prior to Visit  Medication Sig Dispense Refill  . acetaminophen (TYLENOL) 500 MG tablet Take 2 tablets (1,000 mg total) by mouth 2 (two) times daily. 30 tablet 0  . amLODipine (NORVASC) 10 MG tablet TAKE ONE TABLET BY MOUTH ONE TIME DAILY 90 tablet 1  . aspirin 81 MG tablet Take 81 mg by mouth daily.    Marland Kitchen atorvastatin (LIPITOR) 80 MG tablet Take 1 tablet (80 mg total) daily by mouth. 90 tablet 1  . cholecalciferol (VITAMIN D) 1000 units tablet Take 1,000 Units by mouth daily.    . furosemide (LASIX) 40 MG tablet TAKE ONE TABLET BY MOUTH ONE TIME DAILY 90 tablet 1  . losartan (COZAAR) 50 MG tablet Take 1 tablet (50 mg total) by mouth daily. (Patient not taking: Reported on 10/28/2017) 90 tablet 1  . metoprolol succinate (TOPROL-XL) 50 MG 24 hr tablet TAKE ONE TABLET BY MOUTH ONE TIME DAILY. TAKE WITH/OR IMMEDIATELY FOLLOWING A MEAL (Patient not taking: Reported on 10/28/2017) 90 tablet 1  . potassium chloride SA (K-DUR,KLOR-CON) 20 MEQ tablet Take 0.5 tablets (10 mEq total) daily by mouth. 90 tablet 1   No current facility-administered medications on file prior to visit.     Past Surgical History:  Procedure Laterality Date  . ABDOMINAL HYSTERECTOMY      Allergies  Allergen Reactions  . Lisinopril     Renal insufficiency    Social History   Socioeconomic History  . Marital status: Married    Spouse name: Not on file  . Number of children: Not on file  . Years of education: Not on file  . Highest education level: Not on file  Social Needs  . Financial resource strain: Not on file  . Food insecurity - worry: Not on file  . Food insecurity - inability: Not on file  . Transportation needs - medical: Not on file  . Transportation needs - non-medical: Not on file  Occupational History  . Not on file  Tobacco Use  . Smoking status: Never Smoker  . Smokeless tobacco: Never Used  Substance and Sexual Activity  . Alcohol use: No    Alcohol/week: 0.0 oz  . Drug use: Not on file  . Sexual activity: Not on file  Other Topics Concern  . 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 Diabetes Mother     BP (!) 158/86   Pulse 78   Ht 5\' 6"  (1.676 m)   Wt 160 lb (72.6 kg)   BMI 25.82 kg/m   Review of Systems: See HPI above.  Objective:  Physical Exam:  Gen: NAD, comfortable in exam room  Right shoulder: No swelling, ecchymoses.  No gross deformity. No TTP. Lacks about 20 degrees ER.  Abduction and flexion to 120 degrees.  Painful arc. Positive Hawkins, Neers. Negative Yergasons. Strength 5/5 with empty can and resisted internal/external rotation.  Mild pain empty can. Negative apprehension. NV intact distally.  Left shoulder: No swelling, ecchymoses.  No gross deformity. No TTP. FROM. Strength 5/5 with empty can and resisted internal/external rotation. NV intact distally.   Assessment & Plan:  1. Right shoulder pain - 2/2 rotator cuff impingement and mild developing adhesive capsulitis.  Shown motion exercises to do daily.  Ice or heat, mobic daily.  Tylenol if needed as well.  Consider  physical therapy, injection in future.  F/u in 6 weeks.

## 2017-11-07 NOTE — Assessment & Plan Note (Signed)
2/2 rotator cuff impingement and mild developing adhesive capsulitis.  Shown motion exercises to do daily.  Ice or heat, mobic daily.  Tylenol if needed as well.  Consider physical therapy, injection in future.  F/u in 6 weeks.

## 2017-11-09 ENCOUNTER — Telehealth: Payer: Self-pay | Admitting: Family

## 2017-11-09 DIAGNOSIS — R779 Abnormality of plasma protein, unspecified: Secondary | ICD-10-CM

## 2017-11-09 NOTE — Telephone Encounter (Signed)
Protein electrophoresis is from 10/28/17 and specimen is too old to add test at this time. Does pt need to come back to complete?

## 2017-11-09 NOTE — Telephone Encounter (Signed)
Reviewed protein electrophoresis results. Could you please contact the lab and request immunofixation?

## 2017-11-13 NOTE — Telephone Encounter (Signed)
Also, please let her know that her cholesterol is mildly elevated Is she taking lipitor every night?  Please continue to work on low fat/low cholesterol diet. Sugar is mildly elevated.  Protein in her blood is mildly elevated which is why I would like her to complete some additional testing.

## 2017-11-13 NOTE — Telephone Encounter (Signed)
Yes please, needs SPEP with immunofixation.

## 2017-11-14 NOTE — Telephone Encounter (Signed)
Notified pt. She reports compliance with cholesterol medication. Lab appt scheduled for tomorrow at 3:30pm and future order entered.

## 2017-11-15 ENCOUNTER — Other Ambulatory Visit: Payer: Medicare HMO

## 2017-11-15 ENCOUNTER — Ambulatory Visit: Payer: Self-pay | Admitting: Podiatry

## 2017-12-08 ENCOUNTER — Encounter: Payer: Self-pay | Admitting: Family Medicine

## 2017-12-08 ENCOUNTER — Ambulatory Visit (INDEPENDENT_AMBULATORY_CARE_PROVIDER_SITE_OTHER): Payer: Medicare HMO | Admitting: Family Medicine

## 2017-12-08 DIAGNOSIS — M25511 Pain in right shoulder: Secondary | ICD-10-CM

## 2017-12-08 DIAGNOSIS — G8929 Other chronic pain: Secondary | ICD-10-CM

## 2017-12-08 NOTE — Patient Instructions (Signed)
You have rotator cuff impingement and a developing frozen shoulder (adhesive capsulitis), a buildup of scar tissue that limits motion of the shoulder joint. Limit lifting and overhead activities as much as possible. Ice or Heat 15 minutes at a time 3-4 times a day may help with movement and stiffness. Mobic 7.5mg  daily with food for pain and inflammation as needed. Ok to take tylenol with this up to three times a day for pain. Steroid injections in a series have been shown to help with pain and motion - call me if you want to try this if you're not improving as much as expected. Codman exercises (pendulum, wall walking or table slides, arm circles) - do 3 sets of 10 once or twice a day. Add light exercises with a yellow theraband - 3 sets of 5 once a day. Follow up with me only if you're not improving and want to try a cortisone shot, physical therapy.

## 2017-12-08 NOTE — Progress Notes (Signed)
PCP and consultation requested by: [name], NP  Subjective:   HPI: Patient is a 76 y.o. female here for right shoulder pain.  11/14: Patient reports about 2 weeks ago her right shoulder just started hurting without injury or trauma. Pain level up to 5/10 and sharp laterally, radiates down to elbow. + night pain. Bothers with dressing also. Not tried anything for this. No skin changes, numbness. Right handed.  12/20: Patient reports she's doing ok. Pain level 7/10, sharp. No night pain. Doing motion exercises. No skin changes, numbness.  Past Medical History:  Diagnosis Date  . Diabetes mellitus without complication ([place])   . HTN (hypertension)   . Hyperlipidemia     Current Outpatient Medications on File Prior to Visit  Medication Sig Dispense Refill  . acetaminophen (TYLENOL) 500 MG tablet Take 2 tablets (1,000 mg total) by mouth 2 (two) times daily. 30 tablet 0  . amLODipine (NORVASC) 10 MG tablet TAKE ONE TABLET BY MOUTH ONE TIME DAILY 90 tablet 1  . aspirin 81 MG tablet Take 81 mg by mouth daily.    Marland Kitchen atorvastatin (LIPITOR) 80 MG tablet Take 1 tablet (80 mg total) daily by mouth. 90 tablet 1  . cholecalciferol (VITAMIN D) 1000 units tablet Take 1,000 Units by mouth daily.    . furosemide (LASIX) 40 MG tablet TAKE ONE TABLET BY MOUTH ONE TIME DAILY 90 tablet 1  . losartan (COZAAR) 50 MG tablet Take 1 tablet (50 mg total) by mouth daily. (Patient not taking: Reported on 10/28/2017) 90 tablet 1  . meloxicam (MOBIC) 7.5 MG tablet Take 1 tablet (7.5 mg total) daily by mouth. 30 tablet 1  . metoprolol succinate (TOPROL-XL) 50 MG 24 hr tablet TAKE ONE TABLET BY MOUTH ONE TIME DAILY. TAKE WITH/OR IMMEDIATELY FOLLOWING A MEAL (Patient not taking: Reported on 10/28/2017) 90 tablet 1  . potassium chloride SA (K-DUR,KLOR-CON) 20 MEQ tablet Take 0.5 tablets (10 mEq total) daily by mouth. 90 tablet 1   No current facility-administered medications on file prior to visit.      Past Surgical History:  Procedure Laterality Date  . ABDOMINAL HYSTERECTOMY      Allergies  Allergen Reactions  . Lisinopril     Renal insufficiency    Social History   Socioeconomic History  . Marital status: Married    Spouse name: Not on file  . Number of children: Not on file  . Years of education: Not on file  . Highest education level: Not on file  Social Needs  . Financial resource strain: Not on file  . Food insecurity - worry: Not on file  . Food insecurity - inability: Not on file  . Transportation needs - medical: Not on file  . Transportation needs - non-medical: Not on file  Occupational History  . Not on file  Tobacco Use  . Smoking status: Never Smoker  . Smokeless tobacco: Never Used  Substance and Sexual Activity  . Alcohol use: No    Alcohol/week: 0.0 oz  . Drug use: Not on file  . Sexual activity: Not on file  Other Topics Concern  . 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  Diabetes Mother     BP (!) 165/79   Pulse 76   Ht 5\' 7"  (1.702 m)   Wt 155 lb (70.3 kg)   BMI 24.28 kg/m   Review of Systems: See HPI above.     Objective:  Physical Exam:  Gen: NAD, comfortable in exam room.  Right shoulder: No swelling, ecchymoses.  No gross deformity. No TTP. ER only to 20 degrees.  Abduction and flexion to 90 degrees with painful arc. Positive Hawkins, Neers. Negative Yergasons. Strength 5/5 with empty can and resisted internal/external rotation.  Pain very mild empty can. Negative apprehension. NV intact distally.   Assessment & Plan:  1. Right shoulder pain - 2/2 rotator cuff impingement and adhesive capsulitis.  Discussed options - she would like to wait on  physical therapy, injection.  Ice/heat, motion exercises with light strengthening.  mobic if needed.  F/u if she wants to pursue other treatment options.

## 2017-12-08 NOTE — Assessment & Plan Note (Signed)
2/2 rotator cuff impingement and adhesive capsulitis.  Discussed options - she would like to wait on physical therapy, injection.  Ice/heat, motion exercises with light strengthening.  mobic if needed.  F/u if she wants to pursue other treatment options.

## 2017-12-09 ENCOUNTER — Ambulatory Visit: Payer: Medicare HMO | Admitting: Family Medicine

## 2018-01-27 ENCOUNTER — Encounter: Payer: Self-pay | Admitting: Family

## 2018-01-27 ENCOUNTER — Ambulatory Visit (INDEPENDENT_AMBULATORY_CARE_PROVIDER_SITE_OTHER): Payer: Medicare HMO | Admitting: Family

## 2018-01-27 VITALS — BP 169/53 | HR 61 | Temp 98.8°F | Resp 16 | Ht 67.0 in | Wt 176.2 lb

## 2018-01-27 DIAGNOSIS — R55 Syncope and collapse: Secondary | ICD-10-CM | POA: Diagnosis not present

## 2018-01-27 DIAGNOSIS — S0990XA Unspecified injury of head, initial encounter: Secondary | ICD-10-CM | POA: Diagnosis not present

## 2018-01-27 DIAGNOSIS — I10 Essential (primary) hypertension: Secondary | ICD-10-CM

## 2018-01-27 LAB — BASIC METABOLIC PANEL
BUN/Creatinine Ratio: 20 (calc) (ref 6–22)
BUN: 27 mg/dL — ABNORMAL HIGH (ref 7–25)
CALCIUM: 9.4 mg/dL (ref 8.6–10.4)
CHLORIDE: 102 mmol/L (ref 98–110)
CO2: 25 mmol/L (ref 20–32)
Creat: 1.37 mg/dL — ABNORMAL HIGH (ref 0.60–0.93)
GLUCOSE: 91 mg/dL (ref 65–99)
Potassium: 3.8 mmol/L (ref 3.5–5.3)
SODIUM: 140 mmol/L (ref 135–146)

## 2018-01-27 LAB — CBC WITH DIFFERENTIAL/PLATELET
BASOS ABS: 40 {cells}/uL (ref 0–200)
Basophils Relative: 0.5 %
EOS ABS: 150 {cells}/uL (ref 15–500)
Eosinophils Relative: 1.9 %
HCT: 36.8 % (ref 35.0–45.0)
Hemoglobin: 12.4 g/dL (ref 11.7–15.5)
Lymphs Abs: 1177 cells/uL (ref 850–3900)
MCH: 26.4 pg — AB (ref 27.0–33.0)
MCHC: 33.7 g/dL (ref 32.0–36.0)
MCV: 78.3 fL — AB (ref 80.0–100.0)
MONOS PCT: 6.9 %
MPV: 10.3 fL (ref 7.5–12.5)
Neutro Abs: 5988 cells/uL (ref 1500–7800)
Neutrophils Relative %: 75.8 %
Platelets: 310 10*3/uL (ref 140–400)
RBC: 4.7 10*6/uL (ref 3.80–5.10)
RDW: 13.6 % (ref 11.0–15.0)
TOTAL LYMPHOCYTE: 14.9 %
WBC: 7.9 10*3/uL (ref 3.8–10.8)
WBCMIX: 545 {cells}/uL (ref 200–950)

## 2018-01-27 MED ORDER — LOSARTAN POTASSIUM 100 MG PO TABS: 100.0000 mg | ORAL_TABLET | Freq: Every day | ORAL | 3 refills | Status: DC

## 2018-01-27 NOTE — Progress Notes (Signed)
Subjective:    Patient ID: Sherry Perez, female    DOB: 06-27-1941, 77 y.o.   MRN: [id number]  HPI  Patient is a 77 yr old female who presents today for follow up.  Reports that she fell on Sunday.  Daughter was with her. She fell upon exiting the bathroom.  Reports that she repots no loss of bowel/bladder. She notes that she had just moved her bowels.  She hit the front of her head.  There was apparently no seizure-like activity witnessed.  She denies associated chest pain or shortness of breath.  HTN- Reports that she is taking furosemide, amlodipine.  She is not taking metoprolol.   BP Readings from Last 3 Encounters:  01/27/18 (!) 169/53  12/08/17 (!) 165/79  11/02/17 (!) 158/86   Review of Systems     Past Medical History:  Diagnosis Date  . Diabetes mellitus without complication ([place])   . HTN (hypertension)   . Hyperlipidemia      Social History   Socioeconomic History  . Marital status: Married    Spouse name: Not on file  . Number of children: Not on file  . Years of education: Not on file  . Highest education level: Not on file  Social Needs  . Financial resource strain: Not on file  . Food insecurity - worry: Not on file  . Food insecurity - inability: Not on file  . Transportation needs - medical: Not on file  . Transportation needs - non-medical: Not on file  Occupational History  . Not on file  Tobacco Use  . Smoking status: Never Smoker  . Smokeless tobacco: Never Used  Substance and Sexual Activity  . Alcohol use: No    Alcohol/week: 0.0 oz  . Drug use: Not on file  . Sexual activity: Not on file  Other Topics Concern  . 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 ABDOMINAL  HYSTERECTOMY      Family History  Problem Relation Age of Onset  . Diabetes Mother     Allergies  Allergen Reactions  . Lisinopril     Renal insufficiency    Current Outpatient Medications on File Prior to Visit  Medication Sig Dispense Refill  . acetaminophen (TYLENOL) 500 MG tablet Take 2 tablets (1,000 mg total) by mouth 2 (two) times daily. 30 tablet 0  . amLODipine (NORVASC) 10 MG tablet TAKE ONE TABLET BY MOUTH ONE TIME DAILY 90 tablet 1  . aspirin 81 MG tablet Take 81 mg by mouth daily.    Marland Kitchen atorvastatin (LIPITOR) 80 MG tablet Take 1 tablet (80 mg total) daily by mouth. 90 tablet 1  . cholecalciferol (VITAMIN D) 1000 units tablet Take 1,000 Units by mouth daily.    . furosemide (LASIX) 40 MG tablet TAKE ONE TABLET BY MOUTH ONE TIME DAILY 90 tablet 1  . losartan (COZAAR) 50 MG tablet Take 1 tablet (50 mg total) by mouth daily. 90 tablet 1  . meloxicam (MOBIC) 7.5 MG tablet Take 1 tablet (7.5 mg total) daily by mouth. 30 tablet 1  . metoprolol succinate (TOPROL-XL) 50 MG 24 hr tablet TAKE ONE TABLET BY MOUTH ONE TIME DAILY.  TAKE WITH/OR IMMEDIATELY FOLLOWING A MEAL 90 tablet 1  . potassium chloride SA (K-DUR,KLOR-CON) 20 MEQ tablet Take 0.5 tablets (10 mEq total) daily by mouth. 90 tablet 1   No current facility-administered medications on file prior to visit.     BP (!) 169/53 (BP Location: Right Arm, Patient Position: Sitting, Cuff Size: Normal)   Pulse 61   Temp 98.8 F (37.1 C) (Oral)   Resp 16   Ht 5\' 7"  (1.702 m)   Wt 176 lb 3.2 oz (79.9 kg)   SpO2 100%   BMI 27.60 kg/m    Objective:   Physical Exam  Constitutional: She is oriented to person, place, and time. She appears well-developed and well-nourished.  Cardiovascular: Normal rate, regular rhythm and normal heart sounds.  No murmur heard. Pulmonary/Chest: Effort normal and breath sounds normal. No respiratory distress. She has no wheezes.  Musculoskeletal: She exhibits no edema.  Neurological: She is  alert and oriented to person, place, and time.  Skin: Skin is warm and dry.  Psychiatric: She has a normal mood and affect. Her behavior is normal. Judgment and thought content normal.          Assessment & Plan:  Syncope-I suspect she had a vasovagal symptoms syncope as it occurred after bowel movement.  In any event I will order a CT of the head to further evaluate.  She declines to have it done today wishes to defer to another day.  She is neurologically intact today without any visible trauma to her head.  I will also order a 2D echo to further evaluate.  EKG tracing is personally reviewed.  EKG notes NSR.  No acute changes.  I have advised the patient to go to the emergency room should she develop recurrent syncope or if she develops shortness of breath or chest pain.  Patient verbalizes understanding.  Hypertension-uncontrolled.  She states she has not taking the beta-blocker which is on her medication list.  Her heart rate is in the low 60s so I do not want to restart her beta-blocker at this time.  Blood pressure is above goal however.  We will increase her losartan from 50-100 mg once daily.

## 2018-01-27 NOTE — Patient Instructions (Addendum)
Please increase losartan from 50mg  to 100mg  once daily.  Complete lab work prior to leaving.  Go to the ER if you develop recurrent fainting. Complete CT scan on the first floor. You will be contacted about scheduling your echocardiogram.

## 2018-02-10 ENCOUNTER — Ambulatory Visit (INDEPENDENT_AMBULATORY_CARE_PROVIDER_SITE_OTHER): Payer: Medicare HMO | Admitting: Family

## 2018-02-10 ENCOUNTER — Encounter: Payer: Self-pay | Admitting: Family

## 2018-02-10 VITALS — BP 179/54 | HR 73 | Temp 98.5°F | Resp 16 | Ht 67.0 in | Wt 182.6 lb

## 2018-02-10 DIAGNOSIS — R55 Syncope and collapse: Secondary | ICD-10-CM

## 2018-02-10 DIAGNOSIS — I10 Essential (primary) hypertension: Secondary | ICD-10-CM | POA: Diagnosis not present

## 2018-02-10 NOTE — Progress Notes (Signed)
Subjective:    Patient ID: Sherry Perez, female    DOB: March 26, 1941, 77 y.o.   MRN: [id number]  HPI  Patient is a 77 yr old female who presents today for follow up.  She was seen on 01/27/18 following a syncopal episode. Last visit we ordered a CT of her head to further evaluate.  2D echo was ordered.  EKG was performed and noted no acute changes. We had tried to complete the CT the day that her episode occurred but patient wished to reschedule.  It is currently scheduled for March 5. She denies any recurrence of loss of consciousness.     Hypertension-last visit we increased her losartan from 50-100 mg once daily.  She never did this.  BP Readings from Last 3 Encounters:  02/10/18 (!) 179/54  01/27/18 (!) 169/53  12/08/17 (!) 165/79     Review of Systems   See HPI  Past Medical History:  Diagnosis Date  . Diabetes mellitus without complication ([place])   . HTN (hypertension)   . Hyperlipidemia      Social History   Socioeconomic History  . Marital status: Married    Spouse name: Not on file  . Number of children: Not on file  . Years of education: Not on file  . Highest education level: Not on file  Social Needs  . Financial resource strain: Not on file  . Food insecurity - worry: Not on file  . Food insecurity - inability: Not on file  . Transportation needs - medical: Not on file  . Transportation needs - non-medical: Not on file  Occupational History  . Not on file  Tobacco Use  . Smoking status: Never Smoker  . Smokeless tobacco: Never Used  Substance and Sexual Activity  . Alcohol use: No    Alcohol/week: 0.0 oz  . Drug use: Not on file  . Sexual activity: Not on file  Other Topics Concern  . 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 ABDOMINAL HYSTERECTOMY      Family History  Problem Relation Age of Onset  . Diabetes Mother     Allergies  Allergen Reactions  . Lisinopril     Renal insufficiency    Current Outpatient Medications on File Prior to Visit  Medication Sig Dispense Refill  . acetaminophen (TYLENOL) 500 MG tablet Take 2 tablets (1,000 mg total) by mouth 2 (two) times daily. 30 tablet 0  . amLODipine (NORVASC) 10 MG tablet TAKE ONE TABLET BY MOUTH ONE TIME DAILY 90 tablet 1  . aspirin 81 MG tablet Take 81 mg by mouth daily.    Marland Kitchen atorvastatin (LIPITOR) 80 MG tablet Take 1 tablet (80 mg total) daily by mouth. 90 tablet 1  . cholecalciferol (VITAMIN D) 1000 units tablet Take 1,000 Units by mouth daily.    . furosemide (LASIX) 40 MG tablet TAKE ONE TABLET BY MOUTH ONE TIME DAILY 90 tablet 1  . losartan (COZAAR) 100 MG tablet Take 1 tablet (100 mg total) by mouth daily. 30 tablet 3  . meloxicam (MOBIC) 7.5 MG tablet Take 1 tablet (7.5 mg total) daily by mouth. 30 tablet 1  . potassium  chloride SA (K-DUR,KLOR-CON) 20 MEQ tablet Take 0.5 tablets (10 mEq total) daily by mouth. 90 tablet 1   No current facility-administered medications on file prior to visit.     BP (!) 179/54 (BP Location: Right Arm, Patient Position: Sitting, Cuff Size: Large)   Pulse 73   Temp 98.5 F (36.9 C) (Oral)   Resp 16   Ht 5\' 7"  (1.702 m)   Wt 182 lb 9.6 oz (82.8 kg)   SpO2 100%   BMI 28.60 kg/m        Objective:   Physical Exam  Constitutional: She is oriented to person, place, and time. She appears well-developed and well-nourished.  Cardiovascular: Normal rate, regular rhythm and normal heart sounds.  No murmur heard. Pulmonary/Chest: Effort normal and breath sounds normal. No respiratory distress. She has no wheezes.  Neurological: She is alert and oriented to person, place, and time.  Psychiatric: She has a normal mood and affect. Her behavior is normal. Judgment  and thought content normal.          Assessment & Plan:  HTN- uncontrolled. Increase losartan from 50mg  to 100mg .   Syncope- work up still pending, no recurrence.  Will see if we can move up echo and CT scan sooner.

## 2018-02-10 NOTE — Patient Instructions (Signed)
Pick up new dose of losartan (100mg ) and start once daily. Stop the 50mg  dose.

## 2018-02-12 ENCOUNTER — Telehealth: Payer: Self-pay | Admitting: Family

## 2018-02-12 NOTE — Telephone Encounter (Signed)
Could you please try to schedule CT and echo sooner?

## 2018-02-21 ENCOUNTER — Ambulatory Visit (HOSPITAL_BASED_OUTPATIENT_CLINIC_OR_DEPARTMENT_OTHER)
Admission: RE | Admit: 2018-02-21 | Discharge: 2018-02-21 | Disposition: A | Discharge status: Home / Self Care | Payer: Medicare HMO | Source: Ambulatory Visit | Attending: Family | Admitting: Family

## 2018-02-21 ENCOUNTER — Other Ambulatory Visit: Payer: Self-pay | Admitting: Family

## 2018-02-21 DIAGNOSIS — S0990XA Unspecified injury of head, initial encounter: Secondary | ICD-10-CM

## 2018-02-21 DIAGNOSIS — I6782 Cerebral ischemia: Secondary | ICD-10-CM | POA: Diagnosis not present

## 2018-02-21 DIAGNOSIS — E785 Hyperlipidemia, unspecified: Secondary | ICD-10-CM | POA: Insufficient documentation

## 2018-02-21 DIAGNOSIS — I081 Rheumatic disorders of both mitral and tricuspid valves: Secondary | ICD-10-CM | POA: Insufficient documentation

## 2018-02-21 DIAGNOSIS — R55 Syncope and collapse: Secondary | ICD-10-CM | POA: Insufficient documentation

## 2018-02-21 DIAGNOSIS — E119 Type 2 diabetes mellitus without complications: Secondary | ICD-10-CM | POA: Diagnosis not present

## 2018-02-21 DIAGNOSIS — I10 Essential (primary) hypertension: Secondary | ICD-10-CM | POA: Insufficient documentation

## 2018-02-21 DIAGNOSIS — X58XXXA Exposure to other specified factors, initial encounter: Secondary | ICD-10-CM | POA: Insufficient documentation

## 2018-02-21 NOTE — Progress Notes (Signed)
Echocardiogram 2D Echocardiogram has been performed.  Joelene Millin 02/21/2018, 10:15 AM

## 2018-02-24 ENCOUNTER — Encounter: Payer: Self-pay | Admitting: Family

## 2018-02-24 ENCOUNTER — Ambulatory Visit (INDEPENDENT_AMBULATORY_CARE_PROVIDER_SITE_OTHER): Payer: Medicare HMO | Admitting: Family

## 2018-02-24 VITALS — BP 155/54 | HR 68 | Resp 18 | Ht 67.0 in | Wt 180.2 lb

## 2018-02-24 DIAGNOSIS — I10 Essential (primary) hypertension: Secondary | ICD-10-CM

## 2018-02-24 NOTE — Patient Instructions (Signed)
Please continue current medications.   Complete lab work prior to leaving.

## 2018-02-24 NOTE — Progress Notes (Signed)
Subjective:    Patient ID: Sherry Perez, female    DOB: 1941-02-08, 77 y.o.   MRN: [id number]  HPI  Patient is a 77 yr old female who presents today for follow up.  HTN- Last visit we increased her losartan from 50mg  to 100mg . She reports good compliance with her medications. Denies CP/SOB or swelling. BP Readings from Last 3 Encounters:  02/24/18 (!) 155/54  02/10/18 (!) 179/54  01/27/18 (!) 169/53     Review of Systems See HPI  Past Medical History:  Diagnosis Date  . Diabetes mellitus without complication ([place])   . HTN (hypertension)   . Hyperlipidemia      Social History   Socioeconomic History  . Marital status: Widowed    Spouse name: Not on file  . Number of children: Not on file  . Years of education: Not on file  . Highest education level: Not on file  Social Needs  . Financial resource strain: Not on file  . Food insecurity - worry: Not on file  . Food insecurity - inability: Not on file  . Transportation needs - medical: Not on file  . Transportation needs - non-medical: Not on file  Occupational History  . Not on file  Tobacco Use  . Smoking status: Never Smoker  . Smokeless tobacco: Never Used  Substance and Sexual Activity  . Alcohol use: No    Alcohol/week: 0.0 oz  . Drug use: Not on file  . Sexual activity: Not on file  Other Topics Concern  . 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 ABDOMINAL HYSTERECTOMY      Family History  Problem Relation Age of Onset  . Diabetes Mother     Allergies  Allergen Reactions  . Lisinopril     Renal insufficiency    Current Outpatient Medications on File Prior to Visit  Medication Sig Dispense Refill  . acetaminophen  (TYLENOL) 500 MG tablet Take 2 tablets (1,000 mg total) by mouth 2 (two) times daily. 30 tablet 0  . amLODipine (NORVASC) 10 MG tablet TAKE ONE TABLET BY MOUTH ONE TIME DAILY 90 tablet 1  . aspirin 81 MG tablet Take 81 mg by mouth daily.    Marland Kitchen atorvastatin (LIPITOR) 80 MG tablet Take 1 tablet (80 mg total) daily by mouth. 90 tablet 1  . cholecalciferol (VITAMIN D) 1000 units tablet Take 1,000 Units by mouth daily.    . furosemide (LASIX) 40 MG tablet TAKE ONE TABLET BY MOUTH ONE TIME DAILY 90 tablet 1  . losartan (COZAAR) 100 MG tablet Take 1 tablet (100 mg total) by mouth daily. 30 tablet 3  . losartan (COZAAR) 50 MG tablet TAKE ONE TABLET BY MOUTH ONE TIME DAILY 90 tablet 1  . meloxicam (MOBIC) 7.5 MG tablet Take 1 tablet (7.5 mg total) daily by mouth. 30 tablet 1  . potassium chloride SA (K-DUR,KLOR-CON) 20 MEQ tablet Take 0.5 tablets (10 mEq total) daily by mouth. 90 tablet 1   No current facility-administered medications on file prior to visit.     BP (!) 155/54 (BP Location: Right Arm, Patient Position: Sitting, Cuff Size: Large)   Pulse 68  Resp 18   Ht 5\' 7"  (1.702 m)   Wt 180 lb 3.2 oz (81.7 kg)   SpO2 100%   BMI 28.22 kg/m       Objective:   Physical Exam  Constitutional: She is oriented to person, place, and time. She appears well-developed and well-nourished.  Cardiovascular: Normal rate, regular rhythm and normal heart sounds.  No murmur heard. Pulmonary/Chest: Effort normal and breath sounds normal. No respiratory distress. She has no wheezes.  Musculoskeletal: She exhibits no edema.  Neurological: She is alert and oriented to person, place, and time.  Psychiatric: She has a normal mood and affect. Her behavior is normal. Judgment and thought content normal.          Assessment & Plan:  HTN- DBP improved, slightly above goal for her age of <150, however dbp is quite low. Will continue current meds/doses and plan to repeat her BP in 2 months. Obtain follow up  bmet.

## 2018-02-25 LAB — BASIC METABOLIC PANEL
BUN/Creatinine Ratio: 18 (calc) (ref 6–22)
BUN: 27 mg/dL — AB (ref 7–25)
CALCIUM: 9.1 mg/dL (ref 8.6–10.4)
CO2: 27 mmol/L (ref 20–32)
Chloride: 105 mmol/L (ref 98–110)
Creat: 1.52 mg/dL — ABNORMAL HIGH (ref 0.60–0.93)
GLUCOSE: 97 mg/dL (ref 65–99)
Potassium: 4.5 mmol/L (ref 3.5–5.3)
SODIUM: 142 mmol/L (ref 135–146)

## 2018-04-28 ENCOUNTER — Ambulatory Visit (INDEPENDENT_AMBULATORY_CARE_PROVIDER_SITE_OTHER): Payer: Medicare HMO | Admitting: Family

## 2018-04-28 ENCOUNTER — Encounter: Payer: Self-pay | Admitting: Family

## 2018-04-28 VITALS — BP 162/62 | HR 71 | Temp 98.7°F | Resp 16 | Ht 65.5 in | Wt 181.6 lb

## 2018-04-28 DIAGNOSIS — I10 Essential (primary) hypertension: Secondary | ICD-10-CM

## 2018-04-28 MED ORDER — METOPROLOL SUCCINATE ER 25 MG PO TB24: 25.0000 mg | ORAL_TABLET | Freq: Every day | ORAL | 3 refills | Status: DC

## 2018-04-28 NOTE — Patient Instructions (Signed)
Please add Toprol xl today for blood pressure.

## 2018-04-28 NOTE — Progress Notes (Signed)
Subjective:    Patient ID: Sherry Perez, female    DOB: [birth year], 77 y.o.   MRN: [id number]  HPI  Patient is a 77 yr old female who presents today for follow up.  HTN- on amlodipine 10mg .  Lasix, and losartan.   BP Readings from Last 3 Encounters:  04/28/18 (!) 162/62  02/24/18 (!) 155/54  02/10/18 (!) 179/54     Review of Systems    see HPI  Past Medical History:  Diagnosis Date  . Diabetes mellitus without complication ([place])   . HTN (hypertension)   . Hyperlipidemia      Social History   Socioeconomic History  . Marital status: Widowed    Spouse name: Not on file  . Number of children: Not on file  . Years of education: Not on file  . Highest education level: Not on file  Occupational History  . Not on file  Social Needs  . Financial resource strain: Not on file  . Food insecurity:    Worry: Not on file    Inability: Not on file  . Transportation needs:    Medical: Not on file    Non-medical: Not on file  Tobacco Use  . Smoking status: Never Smoker  . Smokeless tobacco: Never Used  Substance and Sexual Activity  . Alcohol use: No    Alcohol/week: 0.0 oz  . Drug use: Not on file  . Sexual activity: Not on file  Lifestyle  . Physical activity:    Days per week: Not on file    Minutes per session: Not on file  . Stress: Not on file  Relationships  . Social connections:    Talks on phone: Not on file    Gets together: Not on file    Attends religious service: Not on file    Active member of club or organization: Not on file    Attends meetings of clubs or organizations: Not on file    Relationship status: Not on file  . Intimate partner violence:    Fear of current or ex partner: Not on file    Emotionally abused: Not on file    Physically abused: Not on file    Forced sexual activity: Not on file  Other Topics Concern  . 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 ABDOMINAL HYSTERECTOMY      Family History  Problem Relation Age of Onset  . Diabetes Mother     Allergies  Allergen Reactions  . Lisinopril     Renal insufficiency    Current Outpatient Medications on File Prior to Visit  Medication Sig Dispense Refill  . acetaminophen (TYLENOL) 500 MG tablet Take 2 tablets (1,000 mg total) by mouth 2 (two) times daily. 30 tablet 0  . amLODipine (NORVASC) 10 MG tablet TAKE ONE TABLET BY MOUTH ONE TIME DAILY 90 tablet 1  . aspirin 81 MG tablet Take 81 mg by mouth daily.    Marland Kitchen atorvastatin (LIPITOR) 80 MG tablet Take 1 tablet (80 mg total) daily by mouth. 90 tablet 1  . cholecalciferol (VITAMIN D) 1000 units tablet Take 1,000 Units by mouth daily.    Marland Kitchen  furosemide (LASIX) 40 MG tablet TAKE ONE TABLET BY MOUTH ONE TIME DAILY 90 tablet 1  . losartan (COZAAR) 100 MG tablet Take 1 tablet (100 mg total) by mouth daily. 30 tablet 3  . meloxicam (MOBIC) 7.5 MG tablet Take 1 tablet (7.5 mg total) daily by mouth. 30 tablet 1  . potassium chloride SA (K-DUR,KLOR-CON) 20 MEQ tablet Take 0.5 tablets (10 mEq total) daily by mouth. 90 tablet 1   No current facility-administered medications on file prior to visit.     BP (!) 162/62 (BP Location: Right Arm, Patient Position: Sitting, Cuff Size: Small)   Pulse 71   Temp 98.7 F (37.1 C) (Oral)   Resp 16   Ht 5' 5.5" (1.664 m)   Wt 181 lb 9.6 oz (82.4 kg)   SpO2 100%   BMI 29.76 kg/m    Objective:   Physical Exam  Constitutional: She appears well-developed and well-nourished.  Cardiovascular: Normal rate, regular rhythm and normal heart sounds.  No murmur heard. Pulmonary/Chest: Effort normal and breath sounds normal. No respiratory distress. She has no wheezes.  Skin: Skin is warm and dry.  Psychiatric: She  has a normal mood and affect. Her behavior is normal. Judgment and thought content normal.          Assessment & Plan:  HTN- Uncontrolled. Will add toprol xl once daily. Follow up in 1 month.

## 2018-05-19 ENCOUNTER — Telehealth: Payer: Self-pay | Admitting: Family

## 2018-05-19 DIAGNOSIS — R778 Other specified abnormalities of plasma proteins: Secondary | ICD-10-CM

## 2018-05-19 NOTE — Telephone Encounter (Signed)
Please contact pt and let her know that computer alerted me that she never completed the follow up blood work that we requested in the end of the year.  Please schedule lab visit. (this is to look at an abnormal protein in her blood).

## 2018-05-22 NOTE — Telephone Encounter (Signed)
Patient has appointment on 05-29-18. Patient requested to have test done this day due to transportation problems.

## 2018-05-29 ENCOUNTER — Ambulatory Visit (INDEPENDENT_AMBULATORY_CARE_PROVIDER_SITE_OTHER): Payer: Medicare HMO | Admitting: Family

## 2018-05-29 ENCOUNTER — Encounter: Payer: Self-pay | Admitting: Family

## 2018-05-29 VITALS — BP 151/68 | HR 72 | Temp 99.2°F | Resp 16 | Ht 65.5 in | Wt 179.4 lb

## 2018-05-29 DIAGNOSIS — Z23 Encounter for immunization: Secondary | ICD-10-CM

## 2018-05-29 DIAGNOSIS — R778 Other specified abnormalities of plasma proteins: Secondary | ICD-10-CM

## 2018-05-29 DIAGNOSIS — M25561 Pain in right knee: Secondary | ICD-10-CM | POA: Diagnosis not present

## 2018-05-29 DIAGNOSIS — I10 Essential (primary) hypertension: Secondary | ICD-10-CM

## 2018-05-29 DIAGNOSIS — G8929 Other chronic pain: Secondary | ICD-10-CM

## 2018-05-29 DIAGNOSIS — R413 Other amnesia: Secondary | ICD-10-CM

## 2018-05-29 NOTE — Patient Instructions (Addendum)
Please complete lab work prior to leaving.  You will be contacted about your appointment with Dr. Barbaraann Barthel for your knee and with neurology for your memory loss.

## 2018-05-29 NOTE — Progress Notes (Signed)
Subjective:    Patient ID: Sherry Perez, female    DOB: Aug 01, 1941, 77 y.o.   MRN: [id number]  HPI  Patient is a 77 yr o ld female who presents today for follow up of her blood pressure.  Last visit we added toprol xl 25mg  once daily to her regimen.  BP Readings from Last 3 Encounters:  05/29/18 (!) 151/68  04/28/18 (!) 162/62  02/24/18 (!) 155/54   Daughter told CMA prior to visit that pt had episode of visual hallucination, calls family members the wrong name.   R Knee pain- reports pain x 1 year. Hurts x 1 yea.  Review of Systems See HPI  Past Medical History:  Diagnosis Date  . Diabetes mellitus without complication ([place])   . HTN (hypertension)   . Hyperlipidemia      Social History   Socioeconomic History  . Marital status: Widowed    Spouse name: Not on file  . Number of children: Not on file  . Years of education: Not on file  . Highest education level: Not on file  Occupational History  . Not on file  Social Needs  . Financial resource strain: Not on file  . Food insecurity:    Worry: Not on file    Inability: Not on file  . Transportation needs:    Medical: Not on file    Non-medical: Not on file  Tobacco Use  . Smoking status: Never Smoker  . Smokeless tobacco: Never Used  Substance and Sexual Activity  . Alcohol use: No    Alcohol/week: 0.0 oz  . Drug use: Not on file  . Sexual activity: Not on file  Lifestyle  . Physical activity:    Days per week: Not on file    Minutes per session: Not on file  . Stress: Not on file  Relationships  . Social connections:    Talks on phone: Not on file    Gets together: Not on file    Attends religious service: Not on file    Active member of club or organization: Not on file    Attends meetings of clubs or organizations: Not on file    Relationship status: Not on file  . Intimate partner violence:    Fear of current or ex partner: Not on file    Emotionally abused: Not on file    Physically abused:  Not on file    Forced sexual activity: Not on file  Other Topics Concern  . 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 ABDOMINAL HYSTERECTOMY      Family History  Problem Relation Age of Onset  . Diabetes Mother     Allergies  Allergen Reactions  . Lisinopril     Renal insufficiency    Current Outpatient Medications on File Prior to Visit  Medication Sig Dispense Refill  . acetaminophen (TYLENOL) 500 MG tablet Take 2 tablets (1,000 mg total) by mouth 2 (two) times daily. 30 tablet 0  . amLODipine (NORVASC) 10 MG tablet TAKE ONE TABLET BY MOUTH ONE TIME DAILY 90 tablet 1  . aspirin 81 MG tablet Take 81 mg by mouth daily.    Marland Kitchen  atorvastatin (LIPITOR) 80 MG tablet Take 1 tablet (80 mg total) daily by mouth. 90 tablet 1  . cholecalciferol (VITAMIN D) 1000 units tablet Take 1,000 Units by mouth daily.    . furosemide (LASIX) 40 MG tablet TAKE ONE TABLET BY MOUTH ONE TIME DAILY 90 tablet 1  . losartan (COZAAR) 100 MG tablet Take 1 tablet (100 mg total) by mouth daily. 30 tablet 3  . meloxicam (MOBIC) 7.5 MG tablet Take 1 tablet (7.5 mg total) daily by mouth. 30 tablet 1  . metoprolol succinate (TOPROL-XL) 25 MG 24 hr tablet Take 1 tablet (25 mg total) by mouth daily. 30 tablet 3  . potassium chloride SA (K-DUR,KLOR-CON) 20 MEQ tablet Take 0.5 tablets (10 mEq total) daily by mouth. 90 tablet 1   No current facility-administered medications on file prior to visit.     BP (!) 151/68 (BP Location: Right Arm, Cuff Size: Normal)   Pulse 72   Temp 99.2 F (37.3 C) (Oral)   Resp 16   Ht 5' 5.5" (1.664 m)   Wt 179 lb 6.4 oz (81.4 kg)   SpO2 100%   BMI 29.40 kg/m       Objective:   Physical Exam  Constitutional:  She appears well-developed and well-nourished.  Cardiovascular: Normal rate, regular rhythm and normal heart sounds.  No murmur heard. Pulmonary/Chest: Effort normal and breath sounds normal. No respiratory distress. She has no wheezes.  Musculoskeletal:  Mild swelling right knee,   Neurological: She is alert.  Psychiatric: She has a normal mood and affect. Her behavior is normal. Judgment and thought content normal.          Assessment & Plan:  Memory loss- scored 13 on PHQ-9.  Family reports one episode of visual hallucination.  Will refer to neurology for further evaluation.    R knee pain- suspect DJD, refer to Dr. Barbaraann Barthel for further evaluation.  HTN- BP is improved, continue current meds.   Abnormal SPEP- check SPEP with immunifixation.

## 2018-05-29 NOTE — Addendum Note (Signed)
Addended by: Kelle Darting A on: 05/29/2018 05:14 PM   Modules accepted: Orders

## 2018-06-01 LAB — PROTEIN ELECTROPHORESIS, SERUM, WITH REFLEX
Albumin ELP: 3.9 g/dL (ref 3.8–4.8)
Alpha 1: 0.3 g/dL (ref 0.2–0.3)
Alpha 2: 0.9 g/dL (ref 0.5–0.9)
BETA GLOBULIN: 0.5 g/dL (ref 0.4–0.6)
Beta 2: 0.6 g/dL — ABNORMAL HIGH (ref 0.2–0.5)
GAMMA GLOBULIN: 1.2 g/dL (ref 0.8–1.7)
TOTAL PROTEIN: 7.5 g/dL (ref 6.1–8.1)

## 2018-06-01 LAB — IFE INTERPRETATION

## 2018-06-02 ENCOUNTER — Telehealth: Payer: Self-pay | Admitting: Family

## 2018-06-02 DIAGNOSIS — R778 Other specified abnormalities of plasma proteins: Secondary | ICD-10-CM

## 2018-06-02 NOTE — Telephone Encounter (Signed)
Please let pt know that lab work is showing an abnormal protein in her blood. I would like her to see hematology for further evaluation please. Referral placed.

## 2018-06-05 NOTE — Telephone Encounter (Signed)
Attempted to reach pt's daughter and was unable to leave message. Will try again tomorrow.

## 2018-06-07 NOTE — Telephone Encounter (Signed)
Notified pt's daughter and she voices understanding.

## 2018-06-08 ENCOUNTER — Other Ambulatory Visit: Payer: Self-pay | Admitting: Family

## 2018-06-12 ENCOUNTER — Ambulatory Visit (INDEPENDENT_AMBULATORY_CARE_PROVIDER_SITE_OTHER): Payer: Medicare HMO | Admitting: Family Medicine

## 2018-06-12 ENCOUNTER — Encounter: Payer: Self-pay | Admitting: Family Medicine

## 2018-06-12 DIAGNOSIS — M25561 Pain in right knee: Secondary | ICD-10-CM | POA: Diagnosis not present

## 2018-06-12 DIAGNOSIS — G8929 Other chronic pain: Secondary | ICD-10-CM | POA: Diagnosis not present

## 2018-06-12 NOTE — Patient Instructions (Signed)
Your pain is due to arthritis. These are the different medications you can take for this: Tylenol 500mg  1-2 tabs three times a day for pain. Capsaicin, aspercreme, or biofreeze topically up to four times a day may also help with pain. Some supplements that may help for arthritis: Boswellia extract, curcumin, pycnogenol Cortisone injections are an option. If cortisone injections do not help, there are different types of shots that may help but they take longer to take effect. It's important that you continue to stay active. Straight leg raises, knee extensions 3 sets of 10 once a day (add ankle weight if these become too easy). Consider physical therapy to strengthen muscles around the joint that hurts to take pressure off of the joint itself. Shoe inserts with good arch support may be helpful. Heat or ice 15 minutes at a time 3-4 times a day as needed to help with pain. Water aerobics and cycling with low resistance are the best two types of exercise for arthritis though any exercise is ok as long as it doesn't worsen the pain. Follow up with me as needed.

## 2018-06-12 NOTE — Assessment & Plan Note (Signed)
history and exam consistent with flare of arthritis.  We discussed topical medications, tylenol, supplements that may help.  Offered injection but declined stating she's improved compared to a week ago.  Home exercises reviewed.  Ice/heat.  Consider physical therapy too.  F/u prn.

## 2018-06-12 NOTE — Progress Notes (Signed)
PCP and consultation requested by: [name], NP  Subjective:   HPI: Patient is a 77 y.o. female here for right knee pain.  Patient here with daughter. They report for several years patient has had episodes of anterior right knee pain off and on. State these happen about once a year and last 2-3 weeks and resolve. Current episode was really severe 1 week ago, up to 10/10 and sharp. Down to 3/10 level now and more dull but worse with ambulation. Tried tylenol, topical medication with only mild benefit. No injuries. No skin changes, numbness.  Past Medical History:  Diagnosis Date  . Diabetes mellitus without complication ([place])   . HTN (hypertension)   . Hyperlipidemia     Current Outpatient Medications on File Prior to Visit  Medication Sig Dispense Refill  . acetaminophen (TYLENOL) 500 MG tablet Take 2 tablets (1,000 mg total) by mouth 2 (two) times daily. 30 tablet 0  . amLODipine (NORVASC) 10 MG tablet TAKE ONE TABLET BY MOUTH ONE TIME DAILY 90 tablet 1  . aspirin 81 MG tablet Take 81 mg by mouth daily.    Marland Kitchen atorvastatin (LIPITOR) 80 MG tablet Take 1 tablet (80 mg total) daily by mouth. 90 tablet 1  . cholecalciferol (VITAMIN D) 1000 units tablet Take 1,000 Units by mouth daily.    . furosemide (LASIX) 40 MG tablet TAKE ONE TABLET BY MOUTH ONE TIME DAILY 90 tablet 1  . losartan (COZAAR) 100 MG tablet TAKE ONE TABLET BY MOUTH ONE TIME DAILY 90 tablet 1  . meloxicam (MOBIC) 7.5 MG tablet Take 1 tablet (7.5 mg total) daily by mouth. 30 tablet 1  . metoprolol succinate (TOPROL-XL) 25 MG 24 hr tablet Take 1 tablet (25 mg total) by mouth daily. 30 tablet 3  . potassium chloride SA (K-DUR,KLOR-CON) 20 MEQ tablet Take 0.5 tablets (10 mEq total) daily by mouth. 90 tablet 1   No current facility-administered medications on file prior to visit.     Past Surgical History:  Procedure Laterality Date  . ABDOMINAL HYSTERECTOMY      Allergies  Allergen Reactions  .  Lisinopril     Renal insufficiency    Social History   Socioeconomic History  . Marital status: Widowed    Spouse name: Not on file  . Number of children: Not on file  . Years of education: Not on file  . Highest education level: Not on file  Occupational History  . Not on file  Social Needs  . Financial resource strain: Not on file  . Food insecurity:    Worry: Not on file    Inability: Not on file  . Transportation needs:    Medical: Not on file    Non-medical: Not on file  Tobacco Use  . Smoking status: Never Smoker  . Smokeless tobacco: Never Used  Substance and Sexual Activity  . Alcohol use: No    Alcohol/week: 0.0 oz  . Drug use: Not on file  . Sexual activity: Not on file  Lifestyle  . Physical activity:    Days per week: Not on file    Minutes per session: Not on file  . Stress: Not on file  Relationships  . Social connections:    Talks on phone: Not on file    Gets together: Not on file    Attends religious service: Not on file    Active member of club or organization: Not on file    Attends meetings of clubs or organizations: Not  on file    Relationship status: Not on file  . Intimate partner violence:    Fear of current or ex partner: Not on file    Emotionally abused: Not on file    Physically abused: Not on file    Forced sexual activity: Not on file  Other Topics Concern  . 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 Diabetes Mother     BP 124/64   Pulse (!) 58   Ht 5\' 6"  (1.676 m)   Wt 185 lb 9.6 oz (84.2 kg)   BMI 29.96 kg/m   Review of Systems: See HPI above.     Objective:  Physical Exam:  Gen: NAD, comfortable in exam room  Right knee: No gross deformity,  ecchymoses.  Mild effusion. TTP medial and lateral joint lines. FROM with 5/5 strength. Negative ant/post drawers. Negative valgus/varus testing. Negative lachmanns. Negative mcmurrays, apleys, patellar apprehension. NV intact distally.  Left knee: No deformity. FROM with 5/5 strength. No tenderness to palpation. NVI distally.   Assessment & Plan:  1. Right knee pain - history and exam consistent with flare of arthritis.  We discussed topical medications, tylenol, supplements that may help.  Offered injection but declined stating she's improved compared to a week ago.  Home exercises reviewed.  Ice/heat.  Consider physical therapy too.  F/u prn.

## 2018-06-19 ENCOUNTER — Other Ambulatory Visit: Payer: Self-pay | Admitting: Family

## 2018-07-06 ENCOUNTER — Other Ambulatory Visit: Payer: Medicare HMO

## 2018-07-06 ENCOUNTER — Ambulatory Visit: Payer: Medicare HMO | Admitting: Family

## 2018-09-01 ENCOUNTER — Ambulatory Visit (INDEPENDENT_AMBULATORY_CARE_PROVIDER_SITE_OTHER): Payer: Medicare HMO | Admitting: Family

## 2018-09-01 ENCOUNTER — Encounter: Payer: Self-pay | Admitting: Family

## 2018-09-01 VITALS — BP 135/59 | HR 57 | Temp 98.8°F | Resp 18 | Ht 66.0 in | Wt 179.8 lb

## 2018-09-01 DIAGNOSIS — R778 Other specified abnormalities of plasma proteins: Secondary | ICD-10-CM | POA: Diagnosis not present

## 2018-09-01 DIAGNOSIS — R413 Other amnesia: Secondary | ICD-10-CM | POA: Diagnosis not present

## 2018-09-01 DIAGNOSIS — N289 Disorder of kidney and ureter, unspecified: Secondary | ICD-10-CM

## 2018-09-01 DIAGNOSIS — Z23 Encounter for immunization: Secondary | ICD-10-CM | POA: Diagnosis not present

## 2018-09-01 DIAGNOSIS — I10 Essential (primary) hypertension: Secondary | ICD-10-CM | POA: Diagnosis not present

## 2018-09-01 NOTE — Progress Notes (Signed)
Subjective:    Patient ID: Sherry Perez, female    DOB: 1941-03-09, 77 y.o.   MRN: [id number]  HPI  Pt is a 77 yr old female who presents today for follow up.   HTN- tolerating meds without difficulty. Denies LE edema.  Tries to adhere to a low sodium diet.   BP Readings from Last 3 Encounters:  09/01/18 (!) 135/59  06/12/18 124/64  05/29/18 (!) 151/68   Memory loss- last visit we referred the patient to neurology. It appears that their office tried to contact the patient but were unsuccessful in arranging appointment.   Abnormal SPEP- Patient has not yet scheduled appointment with hematology.   Review of Systems See HPI  Past Medical History:  Diagnosis Date  . Diabetes mellitus without complication ([place])   . HTN (hypertension)   . Hyperlipidemia      Social History   Socioeconomic History  . Marital status: Widowed    Spouse name: Not on file  . Number of children: Not on file  . Years of education: Not on file  . Highest education level: Not on file  Occupational History  . Not on file  Social Needs  . Financial resource strain: Not on file  . Food insecurity:    Worry: Not on file    Inability: Not on file  . Transportation needs:    Medical: Not on file    Non-medical: Not on file  Tobacco Use  . Smoking status: Never Smoker  . Smokeless tobacco: Never Used  Substance and Sexual Activity  . Alcohol use: No    Alcohol/week: 0.0 standard drinks  . Drug use: Not on file  . Sexual activity: Not on file  Lifestyle  . Physical activity:    Days per week: Not on file    Minutes per session: Not on file  . Stress: Not on file  Relationships  . Social connections:    Talks on phone: Not on file    Gets together: Not on file    Attends religious service: Not on file    Active member of club or organization: Not on file    Attends meetings of clubs or organizations: Not on file    Relationship status: Not on file  . Intimate partner violence:    Fear  of current or ex partner: Not on file    Emotionally abused: Not on file    Physically abused: Not on file    Forced sexual activity: Not on file  Other Topics Concern  . 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 ABDOMINAL HYSTERECTOMY      Family History  Problem Relation Age of Onset  . Diabetes Mother     Allergies  Allergen Reactions  . Lisinopril     Renal insufficiency    Current Outpatient Medications on File Prior to Visit  Medication Sig Dispense Refill  . acetaminophen (TYLENOL) 500 MG tablet Take 2 tablets (1,000 mg total) by mouth 2 (two) times daily. 30 tablet 0  . amLODipine (NORVASC) 10 MG tablet TAKE ONE TABLET BY MOUTH ONE TIME DAILY 90 tablet 1  . aspirin 81 MG tablet  Take 81 mg by mouth daily.    Marland Kitchen atorvastatin (LIPITOR) 80 MG tablet TAKE ONE TABLET BY MOUTH ONE TIME DAILY 90 tablet 1  . cholecalciferol (VITAMIN D) 1000 units tablet Take 1,000 Units by mouth daily.    . furosemide (LASIX) 40 MG tablet TAKE ONE TABLET BY MOUTH ONE TIME DAILY 90 tablet 1  . losartan (COZAAR) 100 MG tablet TAKE ONE TABLET BY MOUTH ONE TIME DAILY 90 tablet 1  . meloxicam (MOBIC) 7.5 MG tablet Take 1 tablet (7.5 mg total) daily by mouth. 30 tablet 1  . metoprolol succinate (TOPROL-XL) 25 MG 24 hr tablet Take 1 tablet (25 mg total) by mouth daily. 30 tablet 3  . potassium chloride SA (K-DUR,KLOR-CON) 20 MEQ tablet Take 0.5 tablets (10 mEq total) daily by mouth. 90 tablet 1   No current facility-administered medications on file prior to visit.     BP (!) 135/59 (BP Location: Right Arm, Cuff Size: Large)   Pulse (!) 57   Temp 98.8 F (37.1 C) (Oral)   Resp 18   Ht 5\' 6"  (1.676 m)   Wt 179 lb 12.8 oz (81.6  kg)   SpO2 100%   BMI 29.02 kg/m      Objective:   Physical Exam  Constitutional: She appears well-developed and well-nourished.  Cardiovascular: Normal rate, regular rhythm and normal heart sounds.  No murmur heard. Pulmonary/Chest: Effort normal and breath sounds normal. No respiratory distress. She has no wheezes.  Neurological: She is alert.  Psychiatric: She has a normal mood and affect. Her behavior is normal. Judgment and thought content normal.          Assessment & Plan:  Memory loss- check b12, rpr, tsh. Pt's daughter will call neurology to schedule appointment. Pt does not remember being contacted about her referrals.  Abnormal SPEP- daughter to call hematology to schedule an appointment.  HTN- bp looks great, continue current meds.    Renal insufficiency- check follow up bmet.    Flu shot today.

## 2018-09-01 NOTE — Patient Instructions (Addendum)
Please call [facility] at [phone] to schedule your consultation regarding memory loss.  Please call Dr Antonieta Pert office at [phone] to schedule your consulation regarding abnormal SPEP labs (blood work).   Please call Dr Ericka Pontiff office to see what else can be done for your knee pain. [phone].

## 2018-09-04 ENCOUNTER — Other Ambulatory Visit: Payer: Self-pay | Admitting: Family

## 2018-09-06 ENCOUNTER — Telehealth: Payer: Self-pay | Admitting: Family

## 2018-09-06 NOTE — Telephone Encounter (Signed)
Attempted to reach pt , line busy

## 2018-09-06 NOTE — Telephone Encounter (Signed)
It looks like she forgot to stop by the lab after her visit. Let's have her schedule a lab appointment please.

## 2018-09-12 ENCOUNTER — Other Ambulatory Visit: Payer: Self-pay | Admitting: Family Medicine

## 2018-09-13 NOTE — Telephone Encounter (Signed)
Author phoned pt. To remind her to get labs drawn. Lab appointment made for 9/27 at 230PM.

## 2018-09-15 ENCOUNTER — Other Ambulatory Visit (INDEPENDENT_AMBULATORY_CARE_PROVIDER_SITE_OTHER): Payer: Medicare HMO

## 2018-09-15 DIAGNOSIS — R413 Other amnesia: Secondary | ICD-10-CM

## 2018-09-15 DIAGNOSIS — N289 Disorder of kidney and ureter, unspecified: Secondary | ICD-10-CM | POA: Diagnosis not present

## 2018-09-15 NOTE — Addendum Note (Signed)
Addended by: Harl Bowie on: 09/15/2018 03:14 PM   Modules accepted: Orders

## 2018-09-18 ENCOUNTER — Telehealth: Payer: Self-pay | Admitting: Family

## 2018-09-18 DIAGNOSIS — N289 Disorder of kidney and ureter, unspecified: Secondary | ICD-10-CM

## 2018-09-18 LAB — VITAMIN B12: VITAMIN B 12: 462 pg/mL (ref 200–1100)

## 2018-09-18 LAB — BASIC METABOLIC PANEL
BUN/Creatinine Ratio: 13 (calc) (ref 6–22)
BUN: 23 mg/dL (ref 7–25)
CALCIUM: 9.2 mg/dL (ref 8.6–10.4)
CO2: 25 mmol/L (ref 20–32)
CREATININE: 1.72 mg/dL — AB (ref 0.60–0.93)
Chloride: 100 mmol/L (ref 98–110)
Glucose, Bld: 98 mg/dL (ref 65–99)
POTASSIUM: 4.1 mmol/L (ref 3.5–5.3)
Sodium: 137 mmol/L (ref 135–146)

## 2018-09-18 LAB — TSH: TSH: 2.61 mIU/L (ref 0.40–4.50)

## 2018-09-18 LAB — RPR: RPR Ser Ql: NONREACTIVE

## 2018-09-18 NOTE — Telephone Encounter (Signed)
Advised patient of results and that she will be getting a call with referral

## 2018-09-18 NOTE — Telephone Encounter (Signed)
Slight worsening of renal function noted on labs. I would like her to see nephrology. Referral placed.

## 2018-10-06 ENCOUNTER — Telehealth: Payer: Self-pay | Admitting: Family

## 2018-10-06 NOTE — Telephone Encounter (Signed)
Copied from [place] [id number]. Topic: Quick Communication - See Telephone Encounter >> Oct 06, 2018 11:16 AM Rutherford Nail, NT wrote: CRM for notification. See Telephone encounter for: 10/06/18. Ulice Dash with [facility] calling to check to see if the office received a FBP 794 with cover sheet? States that it was sent today 10/06/18. Please advise. Would like a call back today. CB#: [phone]

## 2018-10-10 NOTE — Telephone Encounter (Signed)
Order has been received. Attempted to speak with pt's daughter to verify requested order / medical need. Order is for pneumatic compressor, segmental home model and segmental pneumatic appliance, full leg x 2. I do not see a history of lymphedema in pt's chart and most recent office note said pt denied lower extremity edema. No answer / no voicemail at home number. Will try later.

## 2018-10-13 NOTE — Telephone Encounter (Signed)
Attempted to reach pt , line busy

## 2018-10-13 NOTE — Telephone Encounter (Signed)
Called and spoke with female at residence, states daughter [name] should be home around 2pm and to try her back then.

## 2018-10-13 NOTE — Telephone Encounter (Addendum)
Received Physician Orders/CMN from [facility] for [id number] Pneumatic Compression Devices, diagnosis code is for: Peripheral Venous Insufficiency, not in pt's chart Hx; compare orders with Tricia's paperwork and mine was a duplicate so it was shredded/SLS 10/25

## 2018-10-16 NOTE — Telephone Encounter (Signed)
Awaiting reply from 10/15/18 email from pt's daughter.

## 2018-10-17 NOTE — Telephone Encounter (Signed)
Per pt's daughter's email, she was unaware of this order. Advised her that the order will be discarded. She also stated that pt told her daughter that she found herself crying but didn't know why and her right leg is still hurting. I offered to schedule OV for pt but daughter stated she would schedule an appointment if she feels one is needed or pt requests appt.

## 2018-10-17 NOTE — Telephone Encounter (Signed)
Noted  

## 2018-10-24 NOTE — Telephone Encounter (Signed)
Spoke with Sherry Perez at below #. He requested denial form be faxed back as his record indicates pt's daughter cancelled this order and he wanted to make the PCP aware. I advised him I no longer have a copy of the order to fax as a denial and that PCP is aware and denied the order herself as there is no medical indication that pt needs such a device. He voices understanding and will make a notation in their system.

## 2018-10-24 NOTE — Telephone Encounter (Signed)
Caller name: Ulice Dash  Relation to pt: [employer] back number: [phone]   Reason for call:  Requesting compression form please fax form stating request is denied due to the patient fax # [phone]

## 2018-11-04 DIAGNOSIS — Z7982 Long term (current) use of aspirin: Secondary | ICD-10-CM | POA: Diagnosis not present

## 2018-11-04 DIAGNOSIS — K5900 Constipation, unspecified: Secondary | ICD-10-CM | POA: Diagnosis not present

## 2018-11-04 DIAGNOSIS — Z833 Family history of diabetes mellitus: Secondary | ICD-10-CM | POA: Diagnosis not present

## 2018-11-04 DIAGNOSIS — R69 Illness, unspecified: Secondary | ICD-10-CM | POA: Diagnosis not present

## 2018-11-04 DIAGNOSIS — I10 Essential (primary) hypertension: Secondary | ICD-10-CM | POA: Diagnosis not present

## 2018-11-04 DIAGNOSIS — I2510 Atherosclerotic heart disease of native coronary artery without angina pectoris: Secondary | ICD-10-CM | POA: Diagnosis not present

## 2018-11-04 DIAGNOSIS — R609 Edema, unspecified: Secondary | ICD-10-CM | POA: Diagnosis not present

## 2018-11-04 DIAGNOSIS — E785 Hyperlipidemia, unspecified: Secondary | ICD-10-CM | POA: Diagnosis not present

## 2018-11-12 ENCOUNTER — Other Ambulatory Visit: Payer: Self-pay | Admitting: Family

## 2018-11-13 NOTE — Telephone Encounter (Signed)
Sherry Perez -- please see refill protocol failure and advise?

## 2018-11-24 ENCOUNTER — Other Ambulatory Visit: Payer: Self-pay | Admitting: Family

## 2018-11-24 NOTE — Telephone Encounter (Signed)
Amlodipine refill sent. Please see refill protocol failure for Furosemide and advise?

## 2018-12-01 ENCOUNTER — Ambulatory Visit: Payer: Medicare HMO | Admitting: *Deleted

## 2018-12-01 ENCOUNTER — Ambulatory Visit (INDEPENDENT_AMBULATORY_CARE_PROVIDER_SITE_OTHER): Payer: Medicare HMO | Admitting: Family

## 2018-12-01 ENCOUNTER — Encounter: Payer: Self-pay | Admitting: Family

## 2018-12-01 VITALS — BP 147/69 | HR 80 | Temp 98.2°F | Resp 16 | Ht 66.0 in | Wt 178.0 lb

## 2018-12-01 DIAGNOSIS — I10 Essential (primary) hypertension: Secondary | ICD-10-CM

## 2018-12-01 DIAGNOSIS — E119 Type 2 diabetes mellitus without complications: Secondary | ICD-10-CM

## 2018-12-01 DIAGNOSIS — R413 Other amnesia: Secondary | ICD-10-CM

## 2018-12-01 DIAGNOSIS — E559 Vitamin D deficiency, unspecified: Secondary | ICD-10-CM | POA: Diagnosis not present

## 2018-12-01 NOTE — Patient Instructions (Addendum)
Please complete lab work prior to leaving. Please call Dr Antonieta Pert office at [phone] to schedule your consulation regarding abnormal SPEP labs (blood work).

## 2018-12-01 NOTE — Progress Notes (Signed)
Subjective:    Patient ID: Sherry Perez, female    DOB: 02/09/1941, 77 y.o.   MRN: [id number]  HPI  Patient is a 77 yr old female who presents today for follow up.  HTN- on metoprolol, losartan and amlodipine.  BP Readings from Last 3 Encounters:  12/01/18 (!) 147/69  09/01/18 (!) 135/59  06/12/18 124/64   Memory Loss-last visit we recommended that she call neurology to schedule consult for memory loss.   Abnormal SPEP-last visit we instructed her to schedule her consult with oncology. She did not do this. Reports she is agreeable to proceed.   DM2- reports that she is careful with diet.  Lab Results  Component Value Date   HGBA1C 5.9 (H) 10/28/2017   HGBA1C 5.6 02/22/2017   HGBA1C 5.7 09/13/2016   Lab Results  Component Value Date   MICROALBUR 0.7 06/07/2016   LDLCALC 79 10/28/2017   CREATININE 1.72 (H) 09/15/2018     Review of Systems    see HPI  Past Medical History:  Diagnosis Date  . Diabetes mellitus without complication ([place])   . HTN (hypertension)   . Hyperlipidemia      Social History   Socioeconomic History  . Marital status: Widowed    Spouse name: Not on file  . Number of children: Not on file  . Years of education: Not on file  . Highest education level: Not on file  Occupational History  . Not on file  Social Needs  . Financial resource strain: Not on file  . Food insecurity:    Worry: Not on file    Inability: Not on file  . Transportation needs:    Medical: Not on file    Non-medical: Not on file  Tobacco Use  . Smoking status: Never Smoker  . Smokeless tobacco: Never Used  Substance and Sexual Activity  . Alcohol use: No    Alcohol/week: 0.0 standard drinks  . Drug use: Not on file  . Sexual activity: Not on file  Lifestyle  . Physical activity:    Days per week: Not on file    Minutes per session: Not on file  . Stress: Not on file  Relationships  . Social connections:    Talks on phone: Not on file    Gets together:  Not on file    Attends religious service: Not on file    Active member of club or organization: Not on file    Attends meetings of clubs or organizations: Not on file    Relationship status: Not on file  . Intimate partner violence:    Fear of current or ex partner: Not on file    Emotionally abused: Not on file    Physically abused: Not on file    Forced sexual activity: Not on file  Other Topics Concern  . 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 ABDOMINAL HYSTERECTOMY      Family History  Problem Relation Age of Onset  . Diabetes Mother     Allergies  Allergen Reactions  . Lisinopril     Renal insufficiency    Current Outpatient Medications on File Prior to Visit  Medication Sig Dispense Refill  . acetaminophen (TYLENOL) 500 MG tablet Take 2 tablets (1,000 mg total) by mouth 2 (two) times daily. 30 tablet 0  . amLODipine (NORVASC) 10 MG tablet TAKE ONE TABLET BY MOUTH ONE TIME DAILY 90 tablet 1  . aspirin 81 MG tablet Take 81 mg by mouth daily.    Marland Kitchen atorvastatin (LIPITOR) 80 MG tablet TAKE ONE TABLET BY MOUTH ONE TIME DAILY 90 tablet 1  . cholecalciferol (VITAMIN D) 1000 units tablet Take 1,000 Units by mouth daily.    . furosemide (LASIX) 40 MG tablet TAKE ONE TABLET BY MOUTH ONE TIME DAILY 90 tablet 1  . losartan (COZAAR) 100 MG tablet TAKE ONE TABLET BY MOUTH ONE TIME DAILY 90 tablet 1  . meloxicam (MOBIC) 7.5 MG tablet Take 1 tablet (7.5 mg total) daily by mouth. 30 tablet 1  . metoprolol succinate (TOPROL-XL) 25 MG 24 hr tablet TAKE ONE TABLET BY MOUTH ONE TIME DAILY 30 tablet 3  . potassium chloride SA (K-DUR,KLOR-CON) 20 MEQ tablet TAKE ONE-HALF TABLET BY MOUTH ONE TIME DAILY 90 tablet 1   No  current facility-administered medications on file prior to visit.     BP (!) 147/69 (BP Location: Right Arm, Patient Position: Sitting, Cuff Size: Small)   Pulse 80   Temp 98.2 F (36.8 C) (Oral)   Resp 16   Ht 5\' 6"  (1.676 m)   Wt 178 lb (80.7 kg)   SpO2 100%   BMI 28.73 kg/m    Objective:   Physical Exam Constitutional:      Appearance: She is well-developed.  Neck:     Musculoskeletal: Neck supple.     Thyroid: No thyromegaly.  Cardiovascular:     Rate and Rhythm: Normal rate and regular rhythm.     Heart sounds: Normal heart sounds. No murmur.  Pulmonary:     Effort: Pulmonary effort is normal. No respiratory distress.     Breath sounds: Normal breath sounds. No wheezing.  Skin:    General: Skin is warm and dry.  Neurological:     Mental Status: She is alert and oriented to person, place, and time.  Psychiatric:        Behavior: Behavior normal.        Thought Content: Thought content normal.        Judgment: Judgment normal.           Assessment & Plan:  HTN- bp acceptable for her age, continue current meds. Obtain follow up bmet.   DM2- clinically stable. Obtain follow up A1C. Due for eye exam.  Declines referral today.   Lab Results  Component Value Date   HGBA1C 5.9 (H) 10/28/2017   HGBA1C 5.6 02/22/2017   HGBA1C 5.7 09/13/2016   Lab Results  Component Value Date   MICROALBUR 0.7 06/07/2016   LDLCALC 79 10/28/2017   CREATININE 1.72 (H) 09/15/2018   Memory loss- appears unchanged. Declines neuro referral.  Abnormal SPEP- strongly urged her to schedule consult with heme/onc for further evaluation.  Pt is agreeable.   Vit d def- check follow up vit D.

## 2018-12-02 ENCOUNTER — Other Ambulatory Visit: Payer: Self-pay | Admitting: Family

## 2018-12-04 NOTE — Addendum Note (Signed)
Addended by: Kelle Darting A on: 12/04/2018 05:24 PM   Modules accepted: Orders

## 2018-12-05 ENCOUNTER — Other Ambulatory Visit (INDEPENDENT_AMBULATORY_CARE_PROVIDER_SITE_OTHER): Payer: Medicare HMO

## 2018-12-05 DIAGNOSIS — E559 Vitamin D deficiency, unspecified: Secondary | ICD-10-CM

## 2018-12-05 DIAGNOSIS — I10 Essential (primary) hypertension: Secondary | ICD-10-CM | POA: Diagnosis not present

## 2018-12-05 DIAGNOSIS — E119 Type 2 diabetes mellitus without complications: Secondary | ICD-10-CM | POA: Diagnosis not present

## 2018-12-07 ENCOUNTER — Other Ambulatory Visit: Payer: Self-pay

## 2018-12-07 DIAGNOSIS — I10 Essential (primary) hypertension: Secondary | ICD-10-CM

## 2018-12-08 LAB — VITAMIN D 1,25 DIHYDROXY
VITAMIN D 1, 25 (OH) TOTAL: 51 pg/mL (ref 18–72)
VITAMIN D3 1, 25 (OH): 51 pg/mL
Vitamin D2 1, 25 (OH)2: <8 pg/mL

## 2018-12-08 LAB — BASIC METABOLIC PANEL
BUN/Creatinine Ratio: 13 (calc) (ref 6–22)
BUN: 29 mg/dL — ABNORMAL HIGH (ref 7–25)
CALCIUM: 9.3 mg/dL (ref 8.6–10.4)
CHLORIDE: 102 mmol/L (ref 98–110)
CO2: 24 mmol/L (ref 20–32)
Creat: 2.18 mg/dL — ABNORMAL HIGH (ref 0.60–0.93)
Glucose, Bld: 103 mg/dL — ABNORMAL HIGH (ref 65–99)
POTASSIUM: 4.2 mmol/L (ref 3.5–5.3)
Sodium: 141 mmol/L (ref 135–146)

## 2018-12-08 LAB — HEMOGLOBIN A1C
EAG (MMOL/L): 6.8 (calc)
Hgb A1c MFr Bld: 5.9 % of total Hgb — ABNORMAL HIGH (ref <5.7)
MEAN PLASMA GLUCOSE: 123 (calc)

## 2018-12-21 ENCOUNTER — Other Ambulatory Visit (INDEPENDENT_AMBULATORY_CARE_PROVIDER_SITE_OTHER): Payer: Medicare HMO

## 2018-12-21 DIAGNOSIS — I10 Essential (primary) hypertension: Secondary | ICD-10-CM | POA: Diagnosis not present

## 2018-12-21 LAB — BASIC METABOLIC PANEL
BUN: 21 mg/dL (ref 6–23)
CALCIUM: 9.7 mg/dL (ref 8.4–10.5)
CO2: 26 mEq/L (ref 19–32)
CREATININE: 1.48 mg/dL — AB (ref 0.40–1.20)
Chloride: 99 mEq/L (ref 96–112)
GFR: 43.67 mL/min — AB (ref >60.00)
Glucose, Bld: 95 mg/dL (ref 70–99)
Potassium: 3.5 mEq/L (ref 3.5–5.1)
Sodium: 139 mEq/L (ref 135–145)

## 2019-01-15 DIAGNOSIS — N390 Urinary tract infection, site not specified: Secondary | ICD-10-CM | POA: Diagnosis not present

## 2019-01-15 DIAGNOSIS — N289 Disorder of kidney and ureter, unspecified: Secondary | ICD-10-CM | POA: Diagnosis not present

## 2019-01-15 DIAGNOSIS — I129 Hypertensive chronic kidney disease with stage 1 through stage 4 chronic kidney disease, or unspecified chronic kidney disease: Secondary | ICD-10-CM | POA: Diagnosis not present

## 2019-01-18 DIAGNOSIS — N189 Chronic kidney disease, unspecified: Secondary | ICD-10-CM | POA: Diagnosis not present

## 2019-01-18 DIAGNOSIS — N390 Urinary tract infection, site not specified: Secondary | ICD-10-CM | POA: Diagnosis not present

## 2019-01-18 DIAGNOSIS — R319 Hematuria, unspecified: Secondary | ICD-10-CM | POA: Diagnosis not present

## 2019-03-06 ENCOUNTER — Other Ambulatory Visit: Payer: Self-pay | Admitting: Family

## 2019-04-18 DIAGNOSIS — I129 Hypertensive chronic kidney disease with stage 1 through stage 4 chronic kidney disease, or unspecified chronic kidney disease: Secondary | ICD-10-CM | POA: Diagnosis not present

## 2019-04-18 DIAGNOSIS — N289 Disorder of kidney and ureter, unspecified: Secondary | ICD-10-CM | POA: Diagnosis not present

## 2019-04-18 DIAGNOSIS — N390 Urinary tract infection, site not specified: Secondary | ICD-10-CM | POA: Diagnosis not present

## 2019-06-01 ENCOUNTER — Ambulatory Visit: Payer: Medicare HMO | Admitting: Family

## 2019-06-01 ENCOUNTER — Other Ambulatory Visit: Payer: Self-pay

## 2019-06-04 ENCOUNTER — Ambulatory Visit: Payer: Medicare HMO | Admitting: Family

## 2019-06-12 ENCOUNTER — Other Ambulatory Visit: Payer: Self-pay

## 2019-06-12 ENCOUNTER — Encounter: Payer: Self-pay | Admitting: Family

## 2019-06-12 ENCOUNTER — Ambulatory Visit (INDEPENDENT_AMBULATORY_CARE_PROVIDER_SITE_OTHER): Payer: Medicare HMO | Admitting: Family

## 2019-06-12 VITALS — BP 187/76 | HR 67 | Temp 99.0°F | Resp 16 | Ht 66.0 in | Wt 182.0 lb

## 2019-06-12 DIAGNOSIS — I10 Essential (primary) hypertension: Secondary | ICD-10-CM | POA: Diagnosis not present

## 2019-06-12 DIAGNOSIS — E119 Type 2 diabetes mellitus without complications: Secondary | ICD-10-CM

## 2019-06-12 DIAGNOSIS — M1990 Unspecified osteoarthritis, unspecified site: Secondary | ICD-10-CM

## 2019-06-12 MED ORDER — ATORVASTATIN CALCIUM 80 MG PO TABS: 80.0000 mg | ORAL_TABLET | Freq: Every day | ORAL | 1 refills | Status: DC

## 2019-06-12 MED ORDER — POTASSIUM CHLORIDE CRYS ER 20 MEQ PO TBCR: EXTENDED_RELEASE_TABLET | ORAL | 1 refills | Status: DC

## 2019-06-12 MED ORDER — METOPROLOL SUCCINATE ER 25 MG PO TB24: 25.0000 mg | ORAL_TABLET | Freq: Every day | ORAL | 1 refills | Status: DC

## 2019-06-12 MED ORDER — FUROSEMIDE 40 MG PO TABS: 40.0000 mg | ORAL_TABLET | Freq: Every day | ORAL | 1 refills | Status: DC

## 2019-06-12 MED ORDER — LOSARTAN POTASSIUM 100 MG PO TABS: 100.0000 mg | ORAL_TABLET | Freq: Every day | ORAL | 1 refills | Status: DC

## 2019-06-12 MED ORDER — AMLODIPINE BESYLATE 10 MG PO TABS: 10.0000 mg | ORAL_TABLET | Freq: Every day | ORAL | 1 refills | Status: DC

## 2019-06-12 NOTE — Patient Instructions (Signed)
Begin tylenol 1000mg  twice daily for joint pain. Restart all of your other medications- refills have been sent.

## 2019-06-12 NOTE — Progress Notes (Signed)
Subjective:    Patient ID: Sherry Perez, female    DOB: [birth year], 78 y.o.   MRN: [id number]  HPI  Patient is an 78 yr old female who presents today for follow up.  HTN- reports that she has been non-compliant with her medications. "I just ran out of it." She denies LE edema.  BP Readings from Last 3 Encounters:  06/12/19 (!) 187/76  12/01/18 (!) 147/69  09/01/18 (!) 135/59   Right knee pain/right shoulder pain reports that "sometimes I can hardly stand up."   DM2- diet controlled.  Lab Results  Component Value Date   HGBA1C 5.9 (H) 12/05/2018   HGBA1C 5.9 (H) 10/28/2017   HGBA1C 5.6 02/22/2017   Lab Results  Component Value Date   MICROALBUR 0.7 06/07/2016   LDLCALC 79 10/28/2017   CREATININE 1.48 (H) 12/21/2018     Review of Systems    see HPI  Past Medical History:  Diagnosis Date  . Diabetes mellitus without complication ([place])   . HTN (hypertension)   . Hyperlipidemia      Social History   Socioeconomic History  . Marital status: Widowed    Spouse name: Not on file  . Number of children: Not on file  . Years of education: Not on file  . Highest education level: Not on file  Occupational History  . Not on file  Social Needs  . Financial resource strain: Not on file  . Food insecurity    Worry: Not on file    Inability: Not on file  . Transportation needs    Medical: Not on file    Non-medical: Not on file  Tobacco Use  . Smoking status: Never Smoker  . Smokeless tobacco: Never Used  Substance and Sexual Activity  . Alcohol use: No    Alcohol/week: 0.0 standard drinks  . Drug use: Not on file  . Sexual activity: Not on file  Lifestyle  . Physical activity    Days per week: Not on file    Minutes per session: Not on file  . Stress: Not on file  Relationships  . Social Herbalist on phone: Not on file    Gets together: Not on file    Attends religious service: Not on file    Active member of club or organization: Not on file     Attends meetings of clubs or organizations: Not on file    Relationship status: Not on file  . Intimate partner violence    Fear of current or ex partner: Not on file    Emotionally abused: Not on file    Physically abused: Not on file    Forced sexual activity: Not on file  Other Topics Concern  . 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 ABDOMINAL HYSTERECTOMY      Family History  Problem Relation Age of Onset  . Diabetes Mother     Allergies  Allergen Reactions  . Lisinopril     Renal insufficiency    Current Outpatient Medications on File Prior to Visit  Medication Sig Dispense Refill  . acetaminophen (TYLENOL) 500 MG tablet Take 2 tablets (1,000  mg total) by mouth 2 (two) times daily. 30 tablet 0  . aspirin 81 MG tablet Take 81 mg by mouth daily.    . cholecalciferol (VITAMIN D) 1000 units tablet Take 1,000 Units by mouth daily.     No current facility-administered medications on file prior to visit.     BP (!) 187/76 (BP Location: Left Arm, Patient Position: Sitting, Cuff Size: Small)   Pulse 67   Temp 99 F (37.2 C) (Oral)   Resp 16   Ht 5\' 6"  (1.676 m)   Wt 182 lb (82.6 kg)   SpO2 100%   BMI 29.38 kg/m    Objective:   Physical Exam Constitutional:      Appearance: She is well-developed.  Neck:     Musculoskeletal: Neck supple.     Thyroid: No thyromegaly.  Cardiovascular:     Rate and Rhythm: Normal rate and regular rhythm.     Heart sounds: Normal heart sounds. No murmur.  Pulmonary:     Effort: Pulmonary effort is normal. No respiratory distress.     Breath sounds: Normal breath sounds. No wheezing.  Skin:    General: Skin is warm and dry.  Neurological:     Mental  Status: She is alert and oriented to person, place, and time.  Psychiatric:        Behavior: Behavior normal.        Thought Content: Thought content normal.        Judgment: Judgment normal.              Assessment & Plan:  Hypertension-blood pressure is uncontrolled due to medication noncompliance.  Reinforced importance of compliance.  Refills are provided.  Plan to bring patient back in 1 month for repeat blood pressure as well as lab work.  Our lab was closed at the time of today's visit.  Osteoarthritis- not taking any tylenol. Advised pt to try adding standing tylenol 1000mg  po bid.  DM2- plan to check a1C next visit. Clinically stable.

## 2019-06-18 DIAGNOSIS — N390 Urinary tract infection, site not specified: Secondary | ICD-10-CM | POA: Diagnosis not present

## 2019-06-18 DIAGNOSIS — N289 Disorder of kidney and ureter, unspecified: Secondary | ICD-10-CM | POA: Diagnosis not present

## 2019-06-18 DIAGNOSIS — I129 Hypertensive chronic kidney disease with stage 1 through stage 4 chronic kidney disease, or unspecified chronic kidney disease: Secondary | ICD-10-CM | POA: Diagnosis not present

## 2019-07-10 ENCOUNTER — Encounter: Payer: Self-pay | Admitting: Family

## 2019-07-10 ENCOUNTER — Other Ambulatory Visit: Payer: Self-pay

## 2019-07-10 ENCOUNTER — Ambulatory Visit (INDEPENDENT_AMBULATORY_CARE_PROVIDER_SITE_OTHER): Payer: Medicare HMO | Admitting: Family

## 2019-07-10 ENCOUNTER — Ambulatory Visit: Payer: Medicare HMO | Admitting: Family

## 2019-07-10 VITALS — BP 140/52 | HR 53 | Temp 99.0°F | Resp 16 | Wt 178.4 lb

## 2019-07-10 DIAGNOSIS — I10 Essential (primary) hypertension: Secondary | ICD-10-CM | POA: Diagnosis not present

## 2019-07-10 DIAGNOSIS — R739 Hyperglycemia, unspecified: Secondary | ICD-10-CM | POA: Diagnosis not present

## 2019-07-10 DIAGNOSIS — E119 Type 2 diabetes mellitus without complications: Secondary | ICD-10-CM | POA: Diagnosis not present

## 2019-07-10 DIAGNOSIS — E782 Mixed hyperlipidemia: Secondary | ICD-10-CM

## 2019-07-10 DIAGNOSIS — R413 Other amnesia: Secondary | ICD-10-CM | POA: Diagnosis not present

## 2019-07-10 NOTE — Progress Notes (Signed)
Subjective:    Patient ID: Sherry Perez, female    DOB: [birth year], 78 y.o.   MRN: [id number]  HPI  Patient is an 78 yr old female who presents today for routine followup.  memory loss- we discussed last year and referred to neurology. She did not follow through with referral.   HTN- bp meds include amlodipine 10mg , toprol xl 25mg , losartan 100mg .   BP Readings from Last 3 Encounters:  07/10/19 (!) 160/69  06/12/19 (!) 187/76  12/01/18 (!) 147/69   DM2- last eye exam:  <1 year ago. Does not remember the name of the eye doctor.  Lab Results  Component Value Date   HGBA1C 5.9 (H) 12/05/2018   HGBA1C 5.9 (H) 10/28/2017   HGBA1C 5.6 02/22/2017   Lab Results  Component Value Date   MICROALBUR 0.7 06/07/2016   LDLCALC 79 10/28/2017   CREATININE 1.48 (H) 12/21/2018   See MMSE results performed today.   Review of Systems    see HPI  Past Medical History:  Diagnosis Date  . Diabetes mellitus without complication ([place])   . HTN (hypertension)   . Hyperlipidemia      Social History   Socioeconomic History  . Marital status: Widowed    Spouse name: Not on file  . Number of children: Not on file  . Years of education: Not on file  . Highest education level: Not on file  Occupational History  . Not on file  Social Needs  . Financial resource strain: Not on file  . Food insecurity    Worry: Not on file    Inability: Not on file  . Transportation needs    Medical: Not on file    Non-medical: Not on file  Tobacco Use  . Smoking status: Never Smoker  . Smokeless tobacco: Never Used  Substance and Sexual Activity  . Alcohol use: No    Alcohol/week: 0.0 standard drinks  . Drug use: Not on file  . Sexual activity: Not on file  Lifestyle  . Physical activity    Days per week: Not on file    Minutes per session: Not on file  . Stress: Not on file  Relationships  . Social Herbalist on phone: Not on file    Gets together: Not on file    Attends  religious service: Not on file    Active member of club or organization: Not on file    Attends meetings of clubs or organizations: Not on file    Relationship status: Not on file  . Intimate partner violence    Fear of current or ex partner: Not on file    Emotionally abused: Not on file    Physically abused: Not on file    Forced sexual activity: Not on file  Other Topics Concern  . 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 ABDOMINAL HYSTERECTOMY      Family History  Problem Relation Age of Onset  . Diabetes Mother     Allergies  Allergen Reactions  . Lisinopril     Renal insufficiency    Current Outpatient Medications on File Prior to Visit  Medication Sig Dispense Refill  . acetaminophen (TYLENOL) 500 MG tablet Take 2 tablets (1,000 mg total) by mouth 2 (two) times daily. 30 tablet 0  . amLODipine (NORVASC) 10 MG tablet Take 1 tablet (10 mg total) by mouth daily. 90 tablet 1  . aspirin 81 MG tablet Take 81 mg by mouth daily.    Marland Kitchen atorvastatin (LIPITOR) 80 MG tablet Take 1 tablet (80 mg total) by mouth daily. 90 tablet 1  . cholecalciferol (VITAMIN D) 1000 units tablet Take 1,000 Units by mouth daily.    . furosemide (LASIX) 40 MG tablet Take 1 tablet (40 mg total) by mouth daily. 90 tablet 1  . losartan (COZAAR) 100 MG tablet Take 1 tablet (100 mg total) by mouth daily. 90 tablet 1  . metoprolol succinate (TOPROL-XL) 25 MG 24 hr tablet Take 1 tablet (25 mg total) by mouth daily. 90 tablet 1  . potassium chloride SA (K-DUR) 20 MEQ tablet TAKE ONE-HALF TABLET BY MOUTH ONE TIME DAILY 90 tablet 1   No current facility-administered medications on file prior to visit.     BP (!) 160/69 (BP Location: Right  Arm, Patient Position: Sitting, Cuff Size: Large)   Pulse (!) 53   Temp 99 F (37.2 C) (Oral)   Resp 16   Wt 178 lb 6.4 oz (80.9 kg)   SpO2 100%   BMI 28.79 kg/m    Objective:   Physical Exam Constitutional:      Appearance: She is well-developed.  Neck:     Musculoskeletal: Neck supple.     Thyroid: No thyromegaly.  Cardiovascular:     Rate and Rhythm: Normal rate and regular rhythm.     Heart sounds: Normal heart sounds. No murmur.  Pulmonary:     Effort: Pulmonary effort is normal. No respiratory distress.     Breath sounds: Normal breath sounds. No wheezing.  Skin:    General: Skin is warm and dry.  Neurological:     Mental Status: She is alert and oriented to person, place, and time.  Psychiatric:        Behavior: Behavior normal.        Thought Content: Thought content normal.        Judgment: Judgment normal.           Assessment & Plan:  HTN- follow up BP looked ok. Continue current meds. Repeat in 3 months.  DM2- obtain follow up A1C.  Hyperlipidemia- check follow up A1C.    Memory loss- she reports that she is not bothered by this and does not wish to see neurology.

## 2019-07-10 NOTE — Patient Instructions (Signed)
Please complete lab work prior to leaving.   

## 2019-07-11 LAB — COMPREHENSIVE METABOLIC PANEL
ALT: 10 U/L (ref 0–35)
AST: 19 U/L (ref 0–37)
Albumin: 4.1 g/dL (ref 3.5–5.2)
Alkaline Phosphatase: 72 U/L (ref 39–117)
BUN: 24 mg/dL — ABNORMAL HIGH (ref 6–23)
CO2: 25 mEq/L (ref 19–32)
Calcium: 9.2 mg/dL (ref 8.4–10.5)
Chloride: 106 mEq/L (ref 96–112)
Creatinine, Ser: 1.77 mg/dL — ABNORMAL HIGH (ref 0.40–1.20)
GFR: 33.38 mL/min — ABNORMAL LOW (ref >60.00)
Glucose, Bld: 92 mg/dL (ref 70–99)
Potassium: 3.9 mEq/L (ref 3.5–5.1)
Sodium: 140 mEq/L (ref 135–145)
Total Bilirubin: 0.4 mg/dL (ref 0.2–1.2)
Total Protein: 7.7 g/dL (ref 6.0–8.3)

## 2019-07-11 LAB — LIPID PANEL
Cholesterol: 195 mg/dL (ref 0–200)
HDL: 80.3 mg/dL (ref >39.00)
LDL Cholesterol: 99 mg/dL (ref 0–99)
NonHDL: 114.99
Total CHOL/HDL Ratio: 2
Triglycerides: 82 mg/dL (ref 0.0–149.0)
VLDL: 16.4 mg/dL (ref 0.0–40.0)

## 2019-07-11 LAB — HEMOGLOBIN A1C: Hgb A1c MFr Bld: 6 % (ref 4.6–6.5)

## 2019-08-20 DIAGNOSIS — I129 Hypertensive chronic kidney disease with stage 1 through stage 4 chronic kidney disease, or unspecified chronic kidney disease: Secondary | ICD-10-CM | POA: Diagnosis not present

## 2019-08-20 DIAGNOSIS — N289 Disorder of kidney and ureter, unspecified: Secondary | ICD-10-CM | POA: Diagnosis not present

## 2019-08-20 DIAGNOSIS — N390 Urinary tract infection, site not specified: Secondary | ICD-10-CM | POA: Diagnosis not present

## 2019-10-08 ENCOUNTER — Other Ambulatory Visit: Payer: Self-pay

## 2019-10-09 ENCOUNTER — Encounter: Payer: Self-pay | Admitting: Family

## 2019-10-09 ENCOUNTER — Other Ambulatory Visit: Payer: Self-pay

## 2019-10-09 ENCOUNTER — Ambulatory Visit (INDEPENDENT_AMBULATORY_CARE_PROVIDER_SITE_OTHER): Payer: Medicare HMO | Admitting: Family

## 2019-10-09 VITALS — BP 170/52 | HR 73 | Temp 97.8°F | Resp 16 | Ht 65.0 in | Wt 183.0 lb

## 2019-10-09 DIAGNOSIS — I10 Essential (primary) hypertension: Secondary | ICD-10-CM

## 2019-10-09 DIAGNOSIS — M25561 Pain in right knee: Secondary | ICD-10-CM | POA: Diagnosis not present

## 2019-10-09 DIAGNOSIS — G8929 Other chronic pain: Secondary | ICD-10-CM | POA: Diagnosis not present

## 2019-10-09 DIAGNOSIS — E119 Type 2 diabetes mellitus without complications: Secondary | ICD-10-CM | POA: Diagnosis not present

## 2019-10-09 DIAGNOSIS — Z23 Encounter for immunization: Secondary | ICD-10-CM

## 2019-10-09 MED ORDER — METOPROLOL SUCCINATE ER 50 MG PO TB24: 50.0000 mg | ORAL_TABLET | Freq: Every day | ORAL | 0 refills | Status: DC

## 2019-10-09 NOTE — Progress Notes (Signed)
Subjective:    Patient ID: Sherry Perez, female    DOB: 25-Oct-1941, 78 y.o.   MRN: [id number]  HPI  Patient presents today for follow up.  DM2- Pt is maintained on diet alone.  Lab Results  Component Value Date   HGBA1C 6.0 07/10/2019   HGBA1C 5.9 (H) 12/05/2018   HGBA1C 5.9 (H) 10/28/2017   Lab Results  Component Value Date   MICROALBUR 0.7 06/07/2016   [place] 99 07/10/2019   CREATININE 1.77 (H) 07/10/2019   HTN- reports that she is compliant with medications.  BP Readings from Last 3 Encounters:  10/09/19 (!) 170/52  07/10/19 (!) 140/52  06/12/19 (!) 187/76   Right knee pain- reports that his has been present for some time.  Review of Systems     Past Medical History:  Diagnosis Date  . Diabetes mellitus without complication ([place])   . HTN (hypertension)   . Hyperlipidemia      Social History   Socioeconomic History  . Marital status: Widowed    Spouse name: Not on file  . Number of children: Not on file  . Years of education: Not on file  . Highest education level: Not on file  Occupational History  . Not on file  Social Needs  . Financial resource strain: Not on file  . Food insecurity    Worry: Not on file    Inability: Not on file  . Transportation needs    Medical: Not on file    Non-medical: Not on file  Tobacco Use  . Smoking status: Never Smoker  . Smokeless tobacco: Never Used  Substance and Sexual Activity  . Alcohol use: No    Alcohol/week: 0.0 standard drinks  . Drug use: Not on file  . Sexual activity: Not on file  Lifestyle  . Physical activity    Days per week: Not on file    Minutes per session: Not on file  . Stress: Not on file  Relationships  . Social Herbalist on phone: Not on file    Gets together: Not on file    Attends religious service: Not on file    Active member of club or organization: Not on file    Attends meetings of clubs or organizations: Not on file    Relationship status: Not on file  .  Intimate partner violence    Fear of current or ex partner: Not on file    Emotionally abused: Not on file    Physically abused: Not on file    Forced sexual activity: Not on file  Other Topics Concern  . 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 ABDOMINAL HYSTERECTOMY      Family History  Problem Relation Age of Onset  . Diabetes Mother     Allergies  Allergen Reactions  . Lisinopril     Renal insufficiency    Current Outpatient Medications on File Prior to Visit  Medication Sig Dispense Refill  . acetaminophen (TYLENOL) 500 MG tablet Take 2 tablets (1,000 mg total) by mouth 2 (two) times daily. 30 tablet 0  . amLODipine (NORVASC) 10 MG tablet Take 1 tablet (  10 mg total) by mouth daily. 90 tablet 1  . aspirin 81 MG tablet Take 81 mg by mouth daily.    Marland Kitchen atorvastatin (LIPITOR) 80 MG tablet Take 1 tablet (80 mg total) by mouth daily. 90 tablet 1  . cholecalciferol (VITAMIN D) 1000 units tablet Take 1,000 Units by mouth daily.    . furosemide (LASIX) 40 MG tablet Take 1 tablet (40 mg total) by mouth daily. 90 tablet 1  . losartan (COZAAR) 100 MG tablet Take 1 tablet (100 mg total) by mouth daily. 90 tablet 1  . metoprolol succinate (TOPROL-XL) 25 MG 24 hr tablet Take 1 tablet (25 mg total) by mouth daily. 90 tablet 1  . potassium chloride SA (K-DUR) 20 MEQ tablet TAKE ONE-HALF TABLET BY MOUTH ONE TIME DAILY 90 tablet 1   No current facility-administered medications on file prior to visit.     BP (!) 170/52 (BP Location: Right Arm, Patient Position: Sitting, Cuff Size: Small)   Pulse 73   Temp 97.8 F (36.6 C) (Temporal)   Resp 16   Ht 5\' 5"  (1.651 m)   Wt 183 lb (83 kg)   SpO2 100%   BMI 30.45 kg/m     Objective:   Physical Exam Constitutional:      Appearance: She is well-developed.  Cardiovascular:     Rate and Rhythm: Normal rate and regular rhythm.     Heart sounds: Normal heart sounds. No murmur.  Pulmonary:     Effort: Pulmonary effort is normal. No respiratory distress.     Breath sounds: Normal breath sounds. No wheezing.  Musculoskeletal:     Comments: Right knee without sweRight knee without swelling, tenderness or redness. lling, tenderness or redness.  Psychiatric:        Behavior: Behavior normal.        Thought Content: Thought content normal.        Judgment: Judgment normal.           Assessment & Plan:  HTN- uncontrolled. Will increase toprol xl from 25mg  to 50mg  once daily. Repeat bp in 1 month.  R knee pain- likely some OA, recommended tylenol prn. She declines referral at this time.  DM2- clinically stable on diet alone. Obtain follow up A1c.   Flu shot today.

## 2019-10-09 NOTE — Patient Instructions (Addendum)
Please increase metoprolol from 25mg  to 50mg  once daily.  You may use tylenol as needed for knee pain.  Please complete lab work prior to leaving.   Follow up in 1 month.

## 2019-10-10 LAB — BASIC METABOLIC PANEL
BUN: 20 mg/dL (ref 6–23)
CO2: 26 mEq/L (ref 19–32)
Calcium: 8.9 mg/dL (ref 8.4–10.5)
Chloride: 104 mEq/L (ref 96–112)
Creatinine, Ser: 1.55 mg/dL — ABNORMAL HIGH (ref 0.40–1.20)
GFR: 38.87 mL/min — ABNORMAL LOW (ref >60.00)
Glucose, Bld: 90 mg/dL (ref 70–99)
Potassium: 3.9 mEq/L (ref 3.5–5.1)
Sodium: 140 mEq/L (ref 135–145)

## 2019-10-10 LAB — HEMOGLOBIN A1C: Hgb A1c MFr Bld: 5.5 % (ref 4.6–6.5)

## 2019-10-11 ENCOUNTER — Other Ambulatory Visit: Payer: Self-pay | Admitting: Family

## 2019-11-05 ENCOUNTER — Other Ambulatory Visit: Payer: Self-pay

## 2019-11-06 ENCOUNTER — Ambulatory Visit (INDEPENDENT_AMBULATORY_CARE_PROVIDER_SITE_OTHER): Payer: Medicare HMO | Admitting: Family

## 2019-11-06 ENCOUNTER — Ambulatory Visit (INDEPENDENT_AMBULATORY_CARE_PROVIDER_SITE_OTHER): Payer: Medicare HMO | Admitting: *Deleted

## 2019-11-06 ENCOUNTER — Encounter: Payer: Self-pay | Admitting: *Deleted

## 2019-11-06 ENCOUNTER — Encounter: Payer: Self-pay | Admitting: Family

## 2019-11-06 VITALS — BP 159/60 | HR 58 | Ht 66.0 in | Wt 173.9 lb

## 2019-11-06 VITALS — BP 149/65 | HR 58 | Temp 96.5°F | Resp 16 | Ht 66.0 in | Wt 174.0 lb

## 2019-11-06 DIAGNOSIS — Z0000 Encounter for general adult medical examination without abnormal findings: Secondary | ICD-10-CM

## 2019-11-06 DIAGNOSIS — E118 Type 2 diabetes mellitus with unspecified complications: Secondary | ICD-10-CM

## 2019-11-06 DIAGNOSIS — I10 Essential (primary) hypertension: Secondary | ICD-10-CM | POA: Diagnosis not present

## 2019-11-06 DIAGNOSIS — E785 Hyperlipidemia, unspecified: Secondary | ICD-10-CM | POA: Diagnosis not present

## 2019-11-06 NOTE — Patient Instructions (Signed)
Please schedule your next medicare wellness visit with me in 1 yr.  Continue to eat heart healthy diet (full of fruits, vegetables, whole grains, lean protein, water--limit salt, fat, and sugar intake) and increase physical activity as tolerated.  Continue doing brain stimulating activities (puzzles, reading, adult coloring books, staying active) to keep memory sharp.    Sherry Perez , Thank you for taking time to come for your Medicare Wellness Visit. I appreciate your ongoing commitment to your health goals. Please review the following plan we discussed and let me know if I can assist you in the future.   These are the goals we discussed: Goals    . stay healthy       This is a list of the screening recommended for you and due dates:  Health Maintenance  Topic Date Due  . Eye exam for diabetics  02/17/2017  . Complete foot exam   12/02/2019  . Hemoglobin A1C  04/08/2020  . Tetanus Vaccine  12/20/2022  . Flu Shot  Completed  . DEXA scan (bone density measurement)  Completed  . Pneumonia vaccines  Completed    Preventive Care 62 Years and Older, Female Preventive care refers to lifestyle choices and visits with your health care provider that can promote health and wellness. This includes:  A yearly physical exam. This is also called an annual well check.  Regular dental and eye exams.  Immunizations.  Screening for certain conditions.  Healthy lifestyle choices, such as diet and exercise. What can I expect for my preventive care visit? Physical exam Your health care provider will check:  Height and weight. These may be used to calculate body mass index (BMI), which is a measurement that tells if you are at a healthy weight.  Heart rate and blood pressure.  Your skin for abnormal spots. Counseling Your health care provider may ask you questions about:  Alcohol, tobacco, and drug use.  Emotional well-being.  Home and relationship well-being.  Sexual activity.   Eating habits.  History of falls.  Memory and ability to understand (cognition).  Work and work Statistician.  Pregnancy and menstrual history. What immunizations do I need?  Influenza (flu) vaccine  This is recommended every year. Tetanus, diphtheria, and pertussis (Tdap) vaccine  You may need a Td booster every 10 years. Varicella (chickenpox) vaccine  You may need this vaccine if you have not already been vaccinated. Zoster (shingles) vaccine  You may need this after age 79. Pneumococcal conjugate (PCV13) vaccine  One dose is recommended after age 58. Pneumococcal polysaccharide (PPSV23) vaccine  One dose is recommended after age 90. Measles, mumps, and rubella (MMR) vaccine  You may need at least one dose of MMR if you were born in 1957 or later. You may also need a second dose. Meningococcal conjugate (MenACWY) vaccine  You may need this if you have certain conditions. Hepatitis A vaccine  You may need this if you have certain conditions or if you travel or work in places where you may be exposed to hepatitis A. Hepatitis B vaccine  You may need this if you have certain conditions or if you travel or work in places where you may be exposed to hepatitis B. Haemophilus influenzae type b (Hib) vaccine  You may need this if you have certain conditions. You may receive vaccines as individual doses or as more than one vaccine together in one shot (combination vaccines). Talk with your health care provider about the risks and benefits of combination vaccines.  What tests do I need? Blood tests  Lipid and cholesterol levels. These may be checked every 5 years, or more frequently depending on your overall health.  Hepatitis C test.  Hepatitis B test. Screening  Lung cancer screening. You may have this screening every year starting at age 49 if you have a 30-pack-year history of smoking and currently smoke or have quit within the past 15 years.  Colorectal cancer  screening. All adults should have this screening starting at age 39 and continuing until age 81. Your health care provider may recommend screening at age 76 if you are at increased risk. You will have tests every 1-10 years, depending on your results and the type of screening test.  Diabetes screening. This is done by checking your blood sugar (glucose) after you have not eaten for a while (fasting). You may have this done every 1-3 years.  Mammogram. This may be done every 1-2 years. Talk with your health care provider about how often you should have regular mammograms.  BRCA-related cancer screening. This may be done if you have a family history of breast, ovarian, tubal, or peritoneal cancers. Other tests  Sexually transmitted disease (STD) testing.  Bone density scan. This is done to screen for osteoporosis. You may have this done starting at age 17. Follow these instructions at home: Eating and drinking  Eat a diet that includes fresh fruits and vegetables, whole grains, lean protein, and low-fat dairy products. Limit your intake of foods with high amounts of sugar, saturated fats, and salt.  Take vitamin and mineral supplements as recommended by your health care provider.  Do not drink alcohol if your health care provider tells you not to drink.  If you drink alcohol: ? Limit how much you have to 0-1 drink a day. ? Be aware of how much alcohol is in your drink. In the [place] one drink equals one 12 oz bottle of beer (355 mL), one 5 oz glass of wine (148 mL), or one 1 oz glass of hard liquor (44 mL). Lifestyle  Take daily care of your teeth and gums.  Stay active. Exercise for at least 30 minutes on 5 or more days each week.  Do not use any products that contain nicotine or tobacco, such as cigarettes, e-cigarettes, and chewing tobacco. If you need help quitting, ask your health care provider.  If you are sexually active, practice safe sex. Use a condom or other form of  protection in order to prevent STIs (sexually transmitted infections).  Talk with your health care provider about taking a low-dose aspirin or statin. What's next?  Go to your health care provider once a year for a well check visit.  Ask your health care provider how often you should have your eyes and teeth checked.  Stay up to date on all vaccines. This information is not intended to replace advice given to you by your health care provider. Make sure you discuss any questions you have with your health care provider. Document Released: 01/02/2016 Document Revised: 11/30/2018 Document Reviewed: 11/30/2018 Elsevier Patient Education  2020 [employer].

## 2019-11-06 NOTE — Progress Notes (Signed)
Subjective:   Sherry Perez is a 78 y.o. female who presents for an Initial Medicare Annual Wellness Visit.  Review of Systems     Home Safety/Smoke Alarms: Feels safe in home. Smoke alarms in place.  Daughter and grandson live with her. 2 story home. Stays on 1st floor mainly. Pt reports she does well with stairs. Step over tub.   Female:       Mammo-   declines    Dexa scan-  declines         Objective:    Today's Vitals   11/06/19 1507  BP: (!) 159/60  Pulse: (!) 58  SpO2: 100%  Weight: 173 lb 15.1 oz (78.9 kg)  Height: 5\' 6"  (1.676 m)   Body mass index is 28.08 kg/m.  Advanced Directives 11/06/2019  Does Patient Have a Medical Advance Directive? No  Would patient like information on creating a medical advance directive? No - Patient declined    Current Medications (verified) Outpatient Encounter Medications as of 11/06/2019  Medication Sig  . acetaminophen (TYLENOL) 500 MG tablet Take 2 tablets (1,000 mg total) by mouth 2 (two) times daily.  Marland Kitchen amLODipine (NORVASC) 10 MG tablet TAKE ONE TABLET BY MOUTH ONE TIME DAILY  . aspirin 81 MG tablet Take 81 mg by mouth daily.  Marland Kitchen atorvastatin (LIPITOR) 80 MG tablet Take 1 tablet (80 mg total) by mouth daily.  . cholecalciferol (VITAMIN D) 1000 units tablet Take 1,000 Units by mouth daily.  . furosemide (LASIX) 40 MG tablet Take 1 tablet (40 mg total) by mouth daily.  Marland Kitchen losartan (COZAAR) 100 MG tablet Take 1 tablet (100 mg total) by mouth daily.  . metoprolol succinate (TOPROL-XL) 50 MG 24 hr tablet Take 1 tablet (50 mg total) by mouth daily. Take with or immediately following a meal.  . potassium chloride SA (K-DUR) 20 MEQ tablet TAKE ONE-HALF TABLET BY MOUTH ONE TIME DAILY   No facility-administered encounter medications on file as of 11/06/2019.     Allergies (verified) Lisinopril   History: Past Medical History:  Diagnosis Date  . Diabetes mellitus without complication ([place])   . HTN (hypertension)   .  Hyperlipidemia    Past Surgical History:  Procedure Laterality Date  . ABDOMINAL HYSTERECTOMY     Family History  Problem Relation Age of Onset  . Diabetes Mother    Social History   Socioeconomic History  . Marital status: Widowed    Spouse name: Not on file  . Number of children: Not on file  . Years of education: Not on file  . Highest education level: Not on file  Occupational History  . Not on file  Social Needs  . Financial resource strain: Not on file  . Food insecurity    Worry: Not on file    Inability: Not on file  . Transportation needs    Medical: Not on file    Non-medical: Not on file  Tobacco Use  . Smoking status: Never Smoker  . Smokeless tobacco: Never Used  Substance and Sexual Activity  . Alcohol use: No    Alcohol/week: 0.0 standard drinks  . Drug use: Never  . Sexual activity: Not on file  Lifestyle  . Physical activity    Days per week: Not on file    Minutes per session: Not on file  . Stress: Not on file  Relationships  . Social Herbalist on phone: Not on file    Gets together: Not  on file    Attends religious service: Not on file    Active member of club or organization: Not on file    Attends meetings of clubs or organizations: Not on file    Relationship status: Not on file  Other Topics Concern  . 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 N  Vision? N  Difficulty concentrating or making decisions? N  Walking or climbing stairs? N  Dressing or bathing? N   Doing errands, shopping? Y  Comment no longer drives  Preparing Food and eating ? Y  Using the Toilet? N  In the past six months, have you accidently leaked urine? N  Do you have problems with loss of bowel control? N  Managing your Medications? N  Managing your Finances? Y  Housekeeping or managing your Housekeeping? Y  Some recent data might be hidden     Immunizations and Health Maintenance Immunization History  Administered Date(s) Administered  . Fluad Quad(high Dose 65+) 10/09/2019  . Influenza Split 10/23/2012, 09/18/2014, 10/08/2015  . Influenza, High Dose Seasonal PF 09/13/2016, 10/28/2017, 09/01/2018  . Influenza-Unspecified 10/15/2009, 10/23/2010, 09/27/2011  . Pneumococcal Conjugate-13 05/29/2018  . Pneumococcal-Unspecified 09/27/2011   Health Maintenance Due  Topic Date Due  . OPHTHALMOLOGY EXAM  02/17/2017    Patient Care Team: [name], NP as PCP - General (Internal Medicine)  Indicate any recent Medical Services you may have received from other than [facility] providers in the past year (date may be approximate).     Assessment:   This is a routine wellness examination for Sherry Perez. Physical assessment deferred to PCP.  Hearing/Vision screen  Hearing Screening   125Hz  250Hz  500Hz  1000Hz  2000Hz  3000Hz  4000Hz  6000Hz  8000Hz   Right ear:           Left ear:           Comments: Passed whisper test   Visual Acuity Screening   Right eye Left eye Both eyes  Without correction:     With correction: 20/20 20/20 20/20     Dietary issues and exercise activities discussed: Current Exercise Habits: The patient does not participate in regular exercise at present, Exercise limited by: None identified Diet (meal preparation, eat out, water intake, caffeinated beverages, dairy products, fruits and vegetables): well balanced, on average, 3 meals per day   Goals    . stay healthy      Depression Screen PHQ 2/9 Scores 11/06/2019 02/24/2018 02/22/2017 10/25/2016  10/11/2016 09/13/2016 06/07/2016  PHQ - 2 Score 0 1 1 0 0 0 0  PHQ- 9 Score - 1 3 - - - -    Fall Risk Fall Risk  11/06/2019 04/28/2018 02/22/2017 10/25/2016 09/13/2016  Falls in the past year? 0 Yes Yes No Yes  Number falls in past yr: 0 1 1 - 1  Injury with Fall? 0 No No - Yes  Follow up Education provided;Falls prevention discussed - - - -    Cognitive Function:   MMSE - Mini Mental State Exam 11/06/2019  07/10/2019  Not completed: Refused -  Orientation to time - 3  Orientation to Place - 3  Registration - 3  Attention/ Calculation - 4  Recall - 0  Language- name 2 objects - 2  Language- repeat - 1  Language- follow 3 step command - 3  Language- read & follow direction - 1  Write a sentence - 1  Copy design - 1  Total score - 22        Screening Tests Health Maintenance  Topic Date Due  . OPHTHALMOLOGY EXAM  02/17/2017  . FOOT EXAM  12/02/2019  . HEMOGLOBIN A1C  04/08/2020  . TETANUS/TDAP  12/20/2022  . INFLUENZA VACCINE  Completed  . DEXA SCAN  Completed  . PNA vac Low Risk Adult  Completed      Plan:    Please schedule your next medicare wellness visit with me in 1 yr.  Continue to eat heart healthy diet (full of fruits, vegetables, whole grains, lean protein, water--limit salt, fat, and sugar intake) and increase physical activity as tolerated.  Continue doing brain stimulating activities (puzzles, reading, adult coloring books, staying active) to keep memory sharp.     I have personally reviewed and noted the following in the patient's chart:   . Medical and social history . Use of alcohol, tobacco or illicit drugs  . Current medications and supplements . Functional ability and status . Nutritional status . Physical activity . Advanced directives . List of other physicians . Hospitalizations, surgeries, and ER visits in previous 12 months . Vitals . Screenings to include cognitive, depression, and falls . Referrals and appointments  In addition, I  have reviewed and discussed with patient certain preventive protocols, quality metrics, and best practice recommendations. A written personalized care plan for preventive services as well as general preventive health recommendations were provided to patient.     Shela Nevin, [place]   11/06/2019

## 2019-11-06 NOTE — Progress Notes (Signed)
Subjective:    Patient ID: Sherry Perez, female    DOB: 1941/01/16, 78 y.o.   MRN: [id number]  HPI   Patient is an 78 yr old female who presents today for follow up.   DM2- diet controlled. Lab Results  Component Value Date   HGBA1C 5.5 10/09/2019   HGBA1C 6.0 07/10/2019   HGBA1C 5.9 (H) 12/05/2018   Lab Results  Component Value Date   MICROALBUR 0.7 06/07/2016   LDLCALC 99 07/10/2019   CREATININE 1.55 (H) 10/09/2019   HTN- maintained on losartan, metoprolol, amlodipine, and lasix.    BP Readings from Last 3 Encounters:  11/06/19 (!) 159/60  11/06/19 (!) 149/65  10/09/19 (!) 170/52   Hyperlipidemia- maintained on statin.   Review of Systems See HPI  Past Medical History:  Diagnosis Date  . Diabetes mellitus without complication ([place])   . HTN (hypertension)   . Hyperlipidemia      Social History   Socioeconomic History  . Marital status: Widowed    Spouse name: Not on file  . Number of children: Not on file  . Years of education: Not on file  . Highest education level: Not on file  Occupational History  . Not on file  Social Needs  . Financial resource strain: Not on file  . Food insecurity    Worry: Not on file    Inability: Not on file  . Transportation needs    Medical: Not on file    Non-medical: Not on file  Tobacco Use  . Smoking status: Never Smoker  . Smokeless tobacco: Never Used  Substance and Sexual Activity  . Alcohol use: No    Alcohol/week: 0.0 standard drinks  . Drug use: Never  . Sexual activity: Not on file  Lifestyle  . Physical activity    Days per week: Not on file    Minutes per session: Not on file  . Stress: Not on file  Relationships  . Social Herbalist on phone: Not on file    Gets together: Not on file    Attends religious service: Not on file    Active member of club or organization: Not on file    Attends meetings of clubs or organizations: Not on file    Relationship status: Not on file  .  Intimate partner violence    Fear of current or ex partner: Not on file    Emotionally abused: Not on file    Physically abused: Not on file    Forced sexual activity: Not on file  Other Topics Concern  . 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 ABDOMINAL HYSTERECTOMY      Family History  Problem Relation Age of Onset  . Diabetes Mother     Allergies  Allergen Reactions  . Lisinopril     Renal insufficiency    Current Outpatient Medications on File Prior to Visit  Medication Sig Dispense Refill  . acetaminophen (TYLENOL) 500 MG tablet Take 2 tablets (1,000 mg total) by mouth 2 (two) times daily. 30 tablet 0  . amLODipine (NORVASC) 10 MG tablet TAKE ONE TABLET BY MOUTH ONE TIME DAILY  90 tablet 1  . aspirin 81 MG tablet Take 81 mg by mouth daily.    Marland Kitchen atorvastatin (LIPITOR) 80 MG tablet Take 1 tablet (80 mg total) by mouth daily. 90 tablet 1  . cholecalciferol (VITAMIN D) 1000 units tablet Take 1,000 Units by mouth daily.    . furosemide (LASIX) 40 MG tablet Take 1 tablet (40 mg total) by mouth daily. 90 tablet 1  . losartan (COZAAR) 100 MG tablet Take 1 tablet (100 mg total) by mouth daily. 90 tablet 1  . metoprolol succinate (TOPROL-XL) 50 MG 24 hr tablet Take 1 tablet (50 mg total) by mouth daily. Take with or immediately following a meal. 90 tablet 0  . potassium chloride SA (K-DUR) 20 MEQ tablet TAKE ONE-HALF TABLET BY MOUTH ONE TIME DAILY 90 tablet 1   No current facility-administered medications on file prior to visit.     BP (!) 149/65   Pulse (!) 58   Temp (!) 96.5 F (35.8 C) (Temporal)   Resp 16   Ht 5\' 6"  (1.676 m)   Wt 174 lb (78.9 kg)   SpO2 100%   BMI 28.08 kg/m       Objective:    Physical Exam Constitutional:      Appearance: She is well-developed.  Cardiovascular:     Rate and Rhythm: Normal rate and regular rhythm.     Heart sounds: Normal heart sounds. No murmur.  Pulmonary:     Effort: Pulmonary effort is normal. No respiratory distress.     Breath sounds: Normal breath sounds. No wheezing.  Psychiatric:        Behavior: Behavior normal.        Thought Content: Thought content normal.        Judgment: Judgment normal.           Assessment & Plan:  HTN- F/u bp acceptable.  Continue current medications.  DM2- diet controlled.  Stable.  Lab Results  Component Value Date   HGBA1C 5.5 10/09/2019   Hyperlipidemia- LDL at goal. Continue current dose of atorvastatin.   Lab Results  Component Value Date   CHOL 195 07/10/2019   HDL 80.30 07/10/2019   LDLCALC 99 07/10/2019   TRIG 82.0 07/10/2019   CHOLHDL 2 07/10/2019   Lab Results  Component Value Date   CREATININE 1.55 (H) 10/09/2019

## 2019-11-08 ENCOUNTER — Other Ambulatory Visit: Payer: Self-pay | Admitting: Family

## 2019-12-21 ENCOUNTER — Other Ambulatory Visit: Payer: Self-pay | Admitting: Family

## 2020-01-09 ENCOUNTER — Other Ambulatory Visit: Payer: Self-pay | Admitting: Family

## 2020-02-05 ENCOUNTER — Ambulatory Visit: Payer: Medicare HMO | Admitting: Family

## 2020-02-12 ENCOUNTER — Ambulatory Visit (INDEPENDENT_AMBULATORY_CARE_PROVIDER_SITE_OTHER): Payer: Medicare HMO | Admitting: Family

## 2020-02-12 ENCOUNTER — Other Ambulatory Visit: Payer: Self-pay

## 2020-02-12 VITALS — BP 177/68 | HR 82 | Temp 97.4°F | Resp 16 | Wt 177.0 lb

## 2020-02-12 DIAGNOSIS — R413 Other amnesia: Secondary | ICD-10-CM

## 2020-02-12 DIAGNOSIS — E119 Type 2 diabetes mellitus without complications: Secondary | ICD-10-CM

## 2020-02-12 DIAGNOSIS — I10 Essential (primary) hypertension: Secondary | ICD-10-CM | POA: Diagnosis not present

## 2020-02-12 MED ORDER — METOPROLOL SUCCINATE ER 100 MG PO TB24: 100.0000 mg | ORAL_TABLET | Freq: Every day | ORAL | 1 refills | Status: DC

## 2020-02-12 NOTE — Progress Notes (Signed)
Subjective:    Patient ID: Sherry Perez, female    DOB: 11-29-1941, 79 y.o.   MRN: [id number]  HPI  Patient is an 79 yr old female who presents today for follow up.  Reports that she had a fall around christmas time.  No loc. No head trauma.  DM2- on diabetic diet only.  Lab Results  Component Value Date   HGBA1C 5.5 10/09/2019   HGBA1C 6.0 07/10/2019   HGBA1C 5.9 (H) 12/05/2018   Lab Results  Component Value Date   MICROALBUR 0.7 06/07/2016   LDLCALC 99 07/10/2019   CREATININE 1.55 (H) 10/09/2019   HTN- maintained on amlodipine 10, toprol xl 50, losartan 100mg , lasix.    BP Readings from Last 3 Encounters:  02/12/20 (!) 177/68  11/06/19 (!) 159/60  11/06/19 (!) 149/65   Poor historian.  Review of Systems See HPI  Past Medical History:  Diagnosis Date  . Diabetes mellitus without complication ([place])   . HTN (hypertension)   . Hyperlipidemia      Social History   Socioeconomic History  . Marital status: Widowed    Spouse name: Not on file  . Number of children: Not on file  . Years of education: Not on file  . Highest education level: Not on file  Occupational History  . Not on file  Tobacco Use  . Smoking status: Never Smoker  . Smokeless tobacco: Never Used  Substance and Sexual Activity  . Alcohol use: No    Alcohol/week: 0.0 standard drinks  . Drug use: Never  . Sexual activity: Not on file  Other Topics Concern  . 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 Difficulty of Paying Living Expenses: Not on file  Food Insecurity:   . Worried About Charity fundraiser in the Last Year: Not on file  . Ran Out of Food in the Last Year: Not on file  Transportation Needs:     . Lack of Transportation (Medical): Not on file  . Lack of Transportation (Non-Medical): Not on file  Physical Activity:   . Days of Exercise per Week: Not on file  . Minutes of Exercise per Session: Not on file  Stress:   . Feeling of Stress : Not on file  Social Connections:   . Frequency of Communication with Friends and Family: Not on file  . Frequency of Social Gatherings with Friends and Family: Not on file  . Attends Religious Services: Not on file  . Active Member of Clubs or Organizations: Not on file  . Attends Archivist Meetings: Not on file  . Marital Status: Not on file  Intimate Partner Violence:   . Fear of Current or Ex-Partner: Not on file  . Emotionally Abused: Not on file  . Physically Abused: Not on file  . Sexually Abused: Not on file    Past Surgical History:  Procedure Laterality Date  . ABDOMINAL HYSTERECTOMY      Family History  Problem Relation Age of Onset  . Diabetes Mother     Allergies  Allergen Reactions  . Lisinopril     Renal insufficiency    Current Outpatient  Medications on File Prior to Visit  Medication Sig Dispense Refill  . acetaminophen (TYLENOL) 500 MG tablet Take 2 tablets (1,000 mg total) by mouth 2 (two) times daily. 30 tablet 0  . amLODipine (NORVASC) 10 MG tablet TAKE ONE TABLET BY MOUTH ONE TIME DAILY 90 tablet 1  . aspirin 81 MG tablet Take 81 mg by mouth daily.    Marland Kitchen atorvastatin (LIPITOR) 80 MG tablet Take 1 tablet (80 mg total) by mouth daily. 90 tablet 1  . cholecalciferol (VITAMIN D) 1000 units tablet Take 1,000 Units by mouth daily.    . furosemide (LASIX) 40 MG tablet Take 1 tablet (40 mg total) by mouth daily. 90 tablet 1  . losartan (COZAAR) 100 MG tablet TAKE ONE TABLET BY MOUTH ONE TIME DAILY 90 tablet 1  . metoprolol succinate (TOPROL-XL) 50 MG 24 hr tablet Take 1 tablet (50 mg total) by mouth daily. Take with or immediately following a meal. 90 tablet 0  . potassium chloride SA (K-DUR) 20 MEQ  tablet TAKE ONE-HALF TABLET BY MOUTH ONE TIME DAILY 90 tablet 1  . [DISCONTINUED] losartan (COZAAR) 100 MG tablet Take 1 tablet (100 mg total) by mouth daily. 90 tablet 1   No current facility-administered medications on file prior to visit.    BP (!) 177/68 (BP Location: Right Arm, Patient Position: Sitting, Cuff Size: Large)   Pulse 82   Temp (!) 97.4 F (36.3 C) (Temporal)   Resp 16   Wt 177 lb (80.3 kg)   SpO2 100%   BMI 28.57 kg/m       Objective:   Physical Exam Constitutional:      Appearance: She is well-developed.  Neck:     Thyroid: No thyromegaly.  Cardiovascular:     Rate and Rhythm: Normal rate and regular rhythm.     Heart sounds: Normal heart sounds. No murmur.  Pulmonary:     Effort: Pulmonary effort is normal. No respiratory distress.     Breath sounds: Normal breath sounds. No wheezing.  Musculoskeletal:     Cervical back: Neck supple.  Skin:    General: Skin is warm and dry.  Neurological:     Mental Status: She is alert and oriented to person, place, and time.  Psychiatric:        Speech: Speech is tangential.        Behavior: Behavior normal.        Thought Content: Thought content normal.        Judgment: Judgment normal.           Assessment & Plan:  Memory loss- pt seems to be exhibiting obvious memory loss and change in cognitive function since her last visit. Having trouble answering simple questions.  I did discuss this with her daughter in private and her daughter confirms that her memory has worsened. Will obtain a CT head and refer for neurocognitive testing.   DM2- sugar has been stable/controlled. Monitor.   HTN- Uncontrolled. Will increase metoprolol from 50mg  to 100mg . I have asked her daughter to purchase some pill boxes and set up her meds weekly and help pt to remember her medications.  I suspect that there is some issue with poor adherence due to her memory loss.

## 2020-02-12 NOTE — Patient Instructions (Signed)
Increase metoprolol from 50mg  to 100mg .

## 2020-02-13 ENCOUNTER — Other Ambulatory Visit: Payer: Self-pay | Admitting: Family

## 2020-02-14 ENCOUNTER — Encounter: Payer: Self-pay | Admitting: Family

## 2020-02-14 ENCOUNTER — Telehealth: Payer: Self-pay | Admitting: Family

## 2020-02-14 DIAGNOSIS — R29818 Other symptoms and signs involving the nervous system: Secondary | ICD-10-CM

## 2020-02-14 NOTE — Telephone Encounter (Signed)
Please advise daughter that I would also like for the patient to complete a CT head to make sure she hasn't had a stroke which could be contributing to her memory loss. Order pended.

## 2020-02-14 NOTE — Telephone Encounter (Signed)
Patient's daughter [name] advised of refill request. She said it may be from the pharmacy's automated system. She is not sure how much her mother is taking at this time but she will confirm dosage when she gets home and will call back to confirm patient is taking 100 mg.

## 2020-02-14 NOTE — Telephone Encounter (Signed)
I saw refill request for toprol xl 25mg . I initially sent the refill but then saw that this was discontinued back in October. I [facility] a message at [facility] requesting that this refill be cancelled.  Please contact pt's daughter and let her know that our records show that she should not be on 25mg  of toprol xl but should be on 100mg .

## 2020-02-14 NOTE — Telephone Encounter (Signed)
Lvm  at cell number [phone] for dauthter Sherry Perez to call back about medication

## 2020-02-15 NOTE — Telephone Encounter (Signed)
Call patient's daughter [name] at [phone] for her to call me back

## 2020-02-15 NOTE — Telephone Encounter (Signed)
Call patient's daughter [name] at [phone] for her to call me back

## 2020-02-15 NOTE — Telephone Encounter (Signed)
Patient's daughter advised of provider's recommendations. She agrees. Order for ct signed.

## 2020-02-15 NOTE — Telephone Encounter (Signed)
Per daughter, patient is taking the 100mg  daily.

## 2020-02-19 ENCOUNTER — Telehealth: Payer: Self-pay | Admitting: Family

## 2020-02-19 NOTE — Progress Notes (Signed)
  Chronic Care Management   Outreach Note  02/19/2020 Name: Sherry Perez MRN: [id number] DOB: [birth year]  Referred by: [name], NP Reason for referral : No chief complaint on file.   An unsuccessful telephone outreach was attempted today. The patient was referred to the pharmacist for assistance with care management and care coordination.   Follow Up Plan:   Raynicia Dukes [employer] Scheduler

## 2020-02-22 ENCOUNTER — Ambulatory Visit (INDEPENDENT_AMBULATORY_CARE_PROVIDER_SITE_OTHER): Payer: Medicare HMO | Admitting: Family

## 2020-02-22 ENCOUNTER — Other Ambulatory Visit: Payer: Self-pay

## 2020-02-22 ENCOUNTER — Encounter: Payer: Self-pay | Admitting: Family

## 2020-02-22 VITALS — BP 182/59 | HR 58 | Temp 95.5°F | Resp 16 | Wt 179.0 lb

## 2020-02-22 DIAGNOSIS — I10 Essential (primary) hypertension: Secondary | ICD-10-CM

## 2020-02-22 DIAGNOSIS — R413 Other amnesia: Secondary | ICD-10-CM

## 2020-02-22 NOTE — Progress Notes (Signed)
Subjective:    Patient ID: Sherry Perez, female    DOB: Mar 25, 1941, 79 y.o.   MRN: [id number]  HPI   Patient is an 79 yr old female who presents today with her daughter for follow up.   HTN- Last visit we increased her metoprolol from 50mg  to 100mg .  The patient denies missing any doses of her medication.  BP Readings from Last 3 Encounters:  02/22/20 (!) 219/62  02/12/20 (!) 177/68  11/06/19 (!) 159/60      Review of Systems See HPI  Past Medical History:  Diagnosis Date  . Diabetes mellitus without complication ([place])   . HTN (hypertension)   . Hyperlipidemia      Social History   Socioeconomic History  . Marital status: Widowed    Spouse name: Not on file  . Number of children: Not on file  . Years of education: Not on file  . Highest education level: Not on file  Occupational History  . Not on file  Tobacco Use  . Smoking status: Never Smoker  . Smokeless tobacco: Never Used  Substance and Sexual Activity  . Alcohol use: No    Alcohol/week: 0.0 standard drinks  . Drug use: Never  . Sexual activity: Not on file  Other Topics Concern  . 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 Difficulty of Paying Living Expenses: Not on file  Food Insecurity:   . Worried About Charity fundraiser in the Last Year: Not on file  . Ran Out of Food in the Last Year: Not on file  Transportation Needs:   . Lack of Transportation (Medical): Not on file  . Lack of Transportation (Non-Medical): Not on file  Physical Activity:   . Days of Exercise per Week: Not on file  . Minutes of Exercise per Session: Not on file  Stress:   . Feeling of Stress : Not on file  Social Connections:   .  Frequency of Communication with Friends and Family: Not on file  . Frequency of Social Gatherings with Friends and Family: Not on file  . Attends Religious Services: Not on file  . Active Member of Clubs or Organizations: Not on file  . Attends Archivist Meetings: Not on file  . Marital Status: Not on file  Intimate Partner Violence:   . Fear of Current or Ex-Partner: Not on file  . Emotionally Abused: Not on file  . Physically Abused: Not on file  . Sexually Abused: Not on file    Past Surgical History:  Procedure Laterality Date  . ABDOMINAL HYSTERECTOMY      Family History  Problem Relation Age of Onset  . Diabetes Mother     Allergies  Allergen Reactions  . Lisinopril     Renal insufficiency    Current Outpatient Medications on File Prior to Visit  Medication Sig Dispense Refill  . acetaminophen (TYLENOL) 500 MG tablet Take 2 tablets (1,000 mg total) by mouth 2 (two) times daily. 30 tablet 0  . amLODipine (NORVASC) 10 MG tablet TAKE ONE TABLET BY MOUTH ONE TIME DAILY 90 tablet 1  . aspirin 81 MG tablet Take  81 mg by mouth daily.    Marland Kitchen atorvastatin (LIPITOR) 80 MG tablet Take 1 tablet (80 mg total) by mouth daily. 90 tablet 1  . cholecalciferol (VITAMIN D) 1000 units tablet Take 1,000 Units by mouth daily.    . furosemide (LASIX) 40 MG tablet Take 1 tablet (40 mg total) by mouth daily. 90 tablet 1  . losartan (COZAAR) 100 MG tablet TAKE ONE TABLET BY MOUTH ONE TIME DAILY 90 tablet 1  . metoprolol succinate (TOPROL-XL) 100 MG 24 hr tablet Take 1 tablet (100 mg total) by mouth daily. Take with or immediately following a meal. 90 tablet 1  . potassium chloride SA (K-DUR) 20 MEQ tablet TAKE ONE-HALF TABLET BY MOUTH ONE TIME DAILY 90 tablet 1  . [DISCONTINUED] losartan (COZAAR) 100 MG tablet Take 1 tablet (100 mg total) by mouth daily. 90 tablet 1   No current facility-administered medications on file prior to visit.    BP (!) 219/62 (BP Location: Right Arm,  Patient Position: Sitting, Cuff Size: Small)   Pulse (!) 58   Temp (!) 95.5 F (35.3 C) (Temporal)   Resp 16   Wt 179 lb (81.2 kg)   SpO2 100%   BMI 28.89 kg/m       Objective:   Physical Exam Constitutional:      Appearance: She is well-developed.  Cardiovascular:     Rate and Rhythm: Normal rate and regular rhythm.     Heart sounds: Normal heart sounds. No murmur.  Pulmonary:     Effort: Pulmonary effort is normal. No respiratory distress.     Breath sounds: Normal breath sounds. No wheezing.  Psychiatric:        Behavior: Behavior normal.        Thought Content: Thought content normal.        Judgment: Judgment normal.           Assessment & Plan:  HTN- I suspect she is forgetting to take her medication.  I have instructed her daughter to fill a pill case for pt and check behind her to make sure she does not miss any doses. Daughter to check pt's blood pressure once daily and call if BP >180/90.  Follow up in office in 1 week for bp recheck.  Clonidine 0.1mg  PO x 1 given in the office.  Repeat bp as below.   BP Readings from Last 3 Encounters:  02/22/20 (!) 182/59  02/12/20 (!) 177/68  11/06/19 (!) 159/60   Memory loss- they have not heard back about scheduling her memory testing.Will re-initiate referral to [facility] neurology.

## 2020-02-22 NOTE — Patient Instructions (Signed)
Check blood pressure once daily- call if BP >180/90.

## 2020-02-25 ENCOUNTER — Telehealth: Payer: Self-pay | Admitting: Family

## 2020-02-25 ENCOUNTER — Ambulatory Visit (HOSPITAL_BASED_OUTPATIENT_CLINIC_OR_DEPARTMENT_OTHER)
Admission: RE | Admit: 2020-02-25 | Discharge: 2020-02-25 | Disposition: A | Discharge status: Home / Self Care | Payer: Medicare HMO | Source: Ambulatory Visit | Attending: Family | Admitting: Family

## 2020-02-25 ENCOUNTER — Other Ambulatory Visit: Payer: Self-pay

## 2020-02-25 DIAGNOSIS — R29818 Other symptoms and signs involving the nervous system: Secondary | ICD-10-CM | POA: Diagnosis not present

## 2020-02-25 DIAGNOSIS — R413 Other amnesia: Secondary | ICD-10-CM | POA: Diagnosis not present

## 2020-02-25 NOTE — Progress Notes (Signed)
  Chronic Care Management   Outreach Note  02/25/2020 Name: Sherry Perez MRN: [id number] DOB: July 28, 1941  Referred by: [name], NP Reason for referral : No chief complaint on file.   A second unsuccessful telephone outreach was attempted today. The patient was referred to pharmacist for assistance with care management and care coordination.  Follow Up Plan:   Raynicia Dukes [employer] Scheduler

## 2020-02-26 ENCOUNTER — Encounter: Payer: Self-pay | Admitting: Family

## 2020-02-28 ENCOUNTER — Other Ambulatory Visit: Payer: Self-pay

## 2020-02-29 ENCOUNTER — Ambulatory Visit (INDEPENDENT_AMBULATORY_CARE_PROVIDER_SITE_OTHER): Payer: Medicare HMO | Admitting: Family

## 2020-02-29 ENCOUNTER — Other Ambulatory Visit: Payer: Self-pay

## 2020-02-29 ENCOUNTER — Encounter: Payer: Self-pay | Admitting: Family

## 2020-02-29 VITALS — BP 168/41 | HR 50 | Temp 96.6°F | Resp 16 | Wt 176.0 lb

## 2020-02-29 DIAGNOSIS — I10 Essential (primary) hypertension: Secondary | ICD-10-CM

## 2020-02-29 MED ORDER — HYDRALAZINE HCL 25 MG PO TABS: 25.0000 mg | ORAL_TABLET | Freq: Three times a day (TID) | ORAL | 3 refills | Status: DC

## 2020-02-29 NOTE — Patient Instructions (Signed)
Please add hydralazine 25mg  3 times daily. Start tonight.

## 2020-02-29 NOTE — Progress Notes (Signed)
Subjective:    Patient ID: Sherry Perez, female    DOB: [birth year], 79 y.o.   MRN: [id number]  HPI   Patient is an 79 yr old female who presents today for follow up of her hypertension.  The patient reports that she has been taking her medication regularly and her daughter helped her get her medications organized in a pill tray.  BP Readings from Last 3 Encounters:  02/29/20 (!) 168/41  02/22/20 (!) 182/59  02/12/20 (!) 177/68       Review of Systems See HPI  Past Medical History:  Diagnosis Date  . Diabetes mellitus without complication ([place])   . HTN (hypertension)   . Hyperlipidemia      Social History   Socioeconomic History  . Marital status: Widowed    Spouse name: Not on file  . Number of children: Not on file  . Years of education: Not on file  . Highest education level: Not on file  Occupational History  . Not on file  Tobacco Use  . Smoking status: Never Smoker  . Smokeless tobacco: Never Used  Substance and Sexual Activity  . Alcohol use: No    Alcohol/week: 0.0 standard drinks  . Drug use: Never  . Sexual activity: Not on file  Other Topics Concern  . 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 Difficulty of Paying Living Expenses:   Food Insecurity:   . Worried About Charity fundraiser in the Last Year:   . Arboriculturist in the Last Year:   Transportation Needs:   . Film/video editor (Medical):   Marland Kitchen Lack of Transportation (Non-Medical):   Physical Activity:   . Days of Exercise per Week:   . Minutes of Exercise per Session:   Stress:   . Feeling of Stress :   Social Connections:   . Frequency of Communication with Friends and Family:   . Frequency of  Social Gatherings with Friends and Family:   . Attends Religious Services:   . Active Member of Clubs or Organizations:   . Attends Archivist Meetings:   Marland Kitchen Marital Status:   Intimate Partner Violence:   . Fear of Current or Ex-Partner:   . Emotionally Abused:   Marland Kitchen Physically Abused:   . Sexually Abused:     Past Surgical History:  Procedure Laterality Date  . ABDOMINAL HYSTERECTOMY      Family History  Problem Relation Age of Onset  . Diabetes Mother     Allergies  Allergen Reactions  . Lisinopril     Renal insufficiency    Current Outpatient Medications on File Prior to Visit  Medication Sig Dispense Refill  . acetaminophen (TYLENOL) 500 MG tablet Take 2 tablets (1,000 mg total) by mouth 2 (two) times daily. 30 tablet 0  . amLODipine (NORVASC) 10 MG tablet TAKE ONE TABLET BY MOUTH ONE TIME DAILY 90 tablet 1  . aspirin 81 MG tablet Take 81 mg by mouth daily.    Marland Kitchen atorvastatin (LIPITOR) 80 MG tablet Take 1 tablet (80 mg total) by mouth daily. 90 tablet 1  . cholecalciferol (VITAMIN D) 1000 units  tablet Take 1,000 Units by mouth daily.    . furosemide (LASIX) 40 MG tablet Take 1 tablet (40 mg total) by mouth daily. 90 tablet 1  . losartan (COZAAR) 100 MG tablet TAKE ONE TABLET BY MOUTH ONE TIME DAILY 90 tablet 1  . metoprolol succinate (TOPROL-XL) 100 MG 24 hr tablet Take 1 tablet (100 mg total) by mouth daily. Take with or immediately following a meal. 90 tablet 1  . potassium chloride SA (K-DUR) 20 MEQ tablet TAKE ONE-HALF TABLET BY MOUTH ONE TIME DAILY 90 tablet 1  . [DISCONTINUED] losartan (COZAAR) 100 MG tablet Take 1 tablet (100 mg total) by mouth daily. 90 tablet 1   No current facility-administered medications on file prior to visit.    BP (!) 168/41   Pulse (!) 50   Temp (!) 96.6 F (35.9 C) (Temporal)   Resp 16   Wt 176 lb (79.8 kg)   SpO2 100%   BMI 28.41 kg/m       Objective:   Physical Exam Constitutional:      Appearance: She is  well-developed.  Cardiovascular:     Rate and Rhythm: Normal rate and regular rhythm.     Heart sounds: Normal heart sounds. No murmur.  Pulmonary:     Effort: Pulmonary effort is normal. No respiratory distress.     Breath sounds: Normal breath sounds. No wheezing.  Psychiatric:        Behavior: Behavior normal.        Thought Content: Thought content normal.        Judgment: Judgment normal.           Assessment & Plan:  Hypertension-improved but remains uncontrolled.  I have recommended that we add hydralazine 25 mg 3 times daily to her regimen.  I understand that it may be difficult for her to remember the midday dose so I encouraged the daughter to help try to remind her.  Otherwise her daughter will make sure that there is a dose in her a.m. and p.m. tray.  Plan follow-up in the office in 2 weeks.  This visit occurred during the SARS-CoV-2 public health emergency.  Safety protocols were in place, including screening questions prior to the visit, additional usage of staff PPE, and extensive cleaning of exam room while observing appropriate contact time as indicated for disinfecting solutions.

## 2020-03-02 ENCOUNTER — Other Ambulatory Visit: Payer: Self-pay | Admitting: Family

## 2020-03-14 ENCOUNTER — Ambulatory Visit (INDEPENDENT_AMBULATORY_CARE_PROVIDER_SITE_OTHER): Payer: Medicare HMO | Admitting: Family

## 2020-03-14 ENCOUNTER — Encounter: Payer: Self-pay | Admitting: Family

## 2020-03-14 ENCOUNTER — Ambulatory Visit: Payer: Medicare HMO | Admitting: Family

## 2020-03-14 ENCOUNTER — Other Ambulatory Visit: Payer: Self-pay

## 2020-03-14 VITALS — BP 138/47 | HR 68 | Temp 97.7°F | Resp 16 | Wt 178.0 lb

## 2020-03-14 DIAGNOSIS — E119 Type 2 diabetes mellitus without complications: Secondary | ICD-10-CM

## 2020-03-14 DIAGNOSIS — I10 Essential (primary) hypertension: Secondary | ICD-10-CM

## 2020-03-14 MED ORDER — FUROSEMIDE 40 MG PO TABS: 40.0000 mg | ORAL_TABLET | Freq: Every day | ORAL | 1 refills | Status: DC

## 2020-03-14 NOTE — Patient Instructions (Signed)
Please complete lab work prior to leaving.   

## 2020-03-14 NOTE — Progress Notes (Signed)
Subjective:    Patient ID: Sherry Perez, female    DOB: 1941/12/07, 79 y.o.   MRN: [id number]  HPI  Patient is an 79 yr old female who presents today for follow up.  Last visit bp was noted to be elevated. We added hydralazine tid. She reports that she has been taking her medications as prescribed.   BP Readings from Last 3 Encounters:  03/14/20 (!) 138/47  02/29/20 (!) 168/41  02/22/20 (!) 182/59     Review of Systems See HPI  Past Medical History:  Diagnosis Date  . Diabetes mellitus without complication ([place])   . HTN (hypertension)   . Hyperlipidemia      Social History   Socioeconomic History  . Marital status: Widowed    Spouse name: Not on file  . Number of children: Not on file  . Years of education: Not on file  . Highest education level: Not on file  Occupational History  . Not on file  Tobacco Use  . Smoking status: Never Smoker  . Smokeless tobacco: Never Used  Substance and Sexual Activity  . Alcohol use: No    Alcohol/week: 0.0 standard drinks  . Drug use: Never  . Sexual activity: Not on file  Other Topics Concern  . 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 Difficulty of Paying Living Expenses:   Food Insecurity:   . Worried About Charity fundraiser in the Last Year:   . Arboriculturist in the Last Year:   Transportation Needs:   . Film/video editor (Medical):   Marland Kitchen Lack of Transportation (Non-Medical):   Physical Activity:   . Days of Exercise per Week:   . Minutes of Exercise per Session:   Stress:   . Feeling of Stress :   Social Connections:   . Frequency of Communication with Friends and Family:   . Frequency of Social Gatherings with Friends and  Family:   . Attends Religious Services:   . Active Member of Clubs or Organizations:   . Attends Archivist Meetings:   Marland Kitchen Marital Status:   Intimate Partner Violence:   . Fear of Current or Ex-Partner:   . Emotionally Abused:   Marland Kitchen Physically Abused:   . Sexually Abused:     Past Surgical History:  Procedure Laterality Date  . ABDOMINAL HYSTERECTOMY      Family History  Problem Relation Age of Onset  . Diabetes Mother     Allergies  Allergen Reactions  . Lisinopril     Renal insufficiency    Current Outpatient Medications on File Prior to Visit  Medication Sig Dispense Refill  . acetaminophen (TYLENOL) 500 MG tablet Take 2 tablets (1,000 mg total) by mouth 2 (two) times daily. 30 tablet 0  . amLODipine (NORVASC) 10 MG tablet TAKE ONE TABLET BY MOUTH ONE TIME DAILY 90 tablet 1  . aspirin 81 MG tablet Take 81 mg by mouth daily.    Marland Kitchen atorvastatin (LIPITOR) 80 MG tablet TAKE ONE TABLET BY MOUTH ONE TIME DAILY 90 tablet 1  . cholecalciferol (VITAMIN D) 1000 units tablet Take 1,000 Units by mouth daily.    Marland Kitchen  hydrALAZINE (APRESOLINE) 25 MG tablet Take 1 tablet (25 mg total) by mouth 3 (three) times daily. 90 tablet 3  . losartan (COZAAR) 100 MG tablet TAKE ONE TABLET BY MOUTH ONE TIME DAILY 90 tablet 1  . metoprolol succinate (TOPROL-XL) 100 MG 24 hr tablet Take 1 tablet (100 mg total) by mouth daily. Take with or immediately following a meal. 90 tablet 1  . potassium chloride SA (K-DUR) 20 MEQ tablet TAKE ONE-HALF TABLET BY MOUTH ONE TIME DAILY 90 tablet 1  . [DISCONTINUED] losartan (COZAAR) 100 MG tablet Take 1 tablet (100 mg total) by mouth daily. 90 tablet 1   No current facility-administered medications on file prior to visit.    BP (!) 138/47 (BP Location: Right Arm, Patient Position: Sitting, Cuff Size: Small)   Pulse 68   Temp 97.7 F (36.5 C) (Temporal)   Resp 16   Wt 178 lb (80.7 kg)   SpO2 100%   BMI 28.73 kg/m       Objective:   Physical  Exam Constitutional:      Appearance: She is well-developed.  Neck:     Thyroid: No thyromegaly.  Cardiovascular:     Rate and Rhythm: Normal rate and regular rhythm.     Heart sounds: Normal heart sounds. No murmur.  Pulmonary:     Effort: Pulmonary effort is normal. No respiratory distress.     Breath sounds: Normal breath sounds. No wheezing.  Musculoskeletal:     Cervical back: Neck supple.  Skin:    General: Skin is warm and dry.  Neurological:     Mental Status: She is alert and oriented to person, place, and time.  Psychiatric:        Behavior: Behavior normal.        Thought Content: Thought content normal.        Judgment: Judgment normal.           Assessment & Plan:  HTN- bp finally at goal. Continue current meds.  DM2- due for A1C, bmet. Ordered today.

## 2020-06-13 ENCOUNTER — Ambulatory Visit: Payer: Medicare HMO | Admitting: Family

## 2020-06-18 ENCOUNTER — Ambulatory Visit (INDEPENDENT_AMBULATORY_CARE_PROVIDER_SITE_OTHER): Payer: Medicare HMO | Admitting: Family

## 2020-06-18 ENCOUNTER — Other Ambulatory Visit: Payer: Self-pay

## 2020-06-18 VITALS — BP 182/65 | HR 72 | Temp 97.8°F | Resp 16 | Ht 67.0 in | Wt 179.0 lb

## 2020-06-18 DIAGNOSIS — R441 Visual hallucinations: Secondary | ICD-10-CM

## 2020-06-18 DIAGNOSIS — R413 Other amnesia: Secondary | ICD-10-CM | POA: Diagnosis not present

## 2020-06-18 DIAGNOSIS — E119 Type 2 diabetes mellitus without complications: Secondary | ICD-10-CM | POA: Diagnosis not present

## 2020-06-18 MED ORDER — AMLODIPINE BESYLATE 10 MG PO TABS: 10.0000 mg | ORAL_TABLET | Freq: Every day | ORAL | 1 refills | Status: DC

## 2020-06-18 MED ORDER — METOPROLOL SUCCINATE ER 100 MG PO TB24: 100.0000 mg | ORAL_TABLET | Freq: Every day | ORAL | 1 refills | Status: DC

## 2020-06-18 MED ORDER — HYDRALAZINE HCL 25 MG PO TABS: 25.0000 mg | ORAL_TABLET | Freq: Three times a day (TID) | ORAL | 1 refills | Status: DC

## 2020-06-18 MED ORDER — POTASSIUM CHLORIDE CRYS ER 20 MEQ PO TBCR: EXTENDED_RELEASE_TABLET | ORAL | 1 refills | Status: DC

## 2020-06-18 MED ORDER — ATORVASTATIN CALCIUM 80 MG PO TABS: 80.0000 mg | ORAL_TABLET | Freq: Every day | ORAL | 1 refills | Status: DC

## 2020-06-18 MED ORDER — LOSARTAN POTASSIUM 100 MG PO TABS: 100.0000 mg | ORAL_TABLET | Freq: Every day | ORAL | 1 refills | Status: DC

## 2020-06-18 NOTE — Progress Notes (Signed)
Subjective:    Patient ID: Sherry Perez, female    DOB: 1941-08-16, 79 y.o.   MRN: [id number]  HPI  Patient is an 79 yr old female who presents today for follow up.  HTN- maintained on amlodipine, hydralazine, losartan and toprol xl. BP Readings from Last 3 Encounters:  06/18/20 (!) 182/65  03/14/20 (!) 138/47  02/29/20 (!) 168/41   Hypokalemia- maintained on kdur.  DM2-  Lab Results  Component Value Date   HGBA1C 5.5 10/09/2019   HGBA1C 6.0 07/10/2019   HGBA1C 5.9 (H) 12/05/2018   Lab Results  Component Value Date   MICROALBUR 0.7 06/07/2016   [place] 99 07/10/2019   CREATININE 1.55 (H) 10/09/2019     Review of Systems See HPI  Past Medical History:  Diagnosis Date  . Diabetes mellitus without complication ([place])   . HTN (hypertension)   . Hyperlipidemia      Social History   Socioeconomic History  . Marital status: Widowed    Spouse name: Not on file  . Number of children: Not on file  . Years of education: Not on file  . Highest education level: Not on file  Occupational History  . Not on file  Tobacco Use  . Smoking status: Never Smoker  . Smokeless tobacco: Never Used  Vaping Use  . Vaping Use: Never used  Substance and Sexual Activity  . Alcohol use: No    Alcohol/week: 0.0 standard drinks  . Drug use: Never  . Sexual activity: Not on file  Other Topics Concern  . 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 Difficulty of Paying Living Expenses:   Food Insecurity:   . Worried About Charity fundraiser in the Last Year:   . Arboriculturist in the Last Year:   Transportation Needs:   . Film/video editor (Medical):   Marland Kitchen Lack of Transportation (Non-Medical):     Physical Activity:   . Days of Exercise per Week:   . Minutes of Exercise per Session:   Stress:   . Feeling of Stress :   Social Connections:   . Frequency of Communication with Friends and Family:   . Frequency of Social Gatherings with Friends and Family:   . Attends Religious Services:   . Active Member of Clubs or Organizations:   . Attends Archivist Meetings:   Marland Kitchen Marital Status:   Intimate Partner Violence:   . Fear of Current or Ex-Partner:   . Emotionally Abused:   Marland Kitchen Physically Abused:   . Sexually Abused:     Past Surgical History:  Procedure Laterality Date  . ABDOMINAL HYSTERECTOMY      Family History  Problem Relation Age of Onset  . Diabetes Mother     Allergies  Allergen Reactions  . Lisinopril     Renal insufficiency    Current Outpatient Medications on File Prior to Visit  Medication Sig Dispense Refill  . acetaminophen (TYLENOL) 500 MG tablet Take 2 tablets (1,000 mg total) by mouth 2 (two) times daily. 30 tablet 0  . amLODipine (NORVASC) 10 MG tablet TAKE ONE TABLET BY MOUTH ONE TIME DAILY  90 tablet 1  . aspirin 81 MG tablet Take 81 mg by mouth daily.    Marland Kitchen atorvastatin (LIPITOR) 80 MG tablet TAKE ONE TABLET BY MOUTH ONE TIME DAILY 90 tablet 1  . cholecalciferol (VITAMIN D) 1000 units tablet Take 1,000 Units by mouth daily.    . furosemide (LASIX) 40 MG tablet Take 1 tablet (40 mg total) by mouth daily. 90 tablet 1  . hydrALAZINE (APRESOLINE) 25 MG tablet Take 1 tablet (25 mg total) by mouth 3 (three) times daily. 90 tablet 3  . losartan (COZAAR) 100 MG tablet TAKE ONE TABLET BY MOUTH ONE TIME DAILY 90 tablet 1  . metoprolol succinate (TOPROL-XL) 100 MG 24 hr tablet Take 1 tablet (100 mg total) by mouth daily. Take with or immediately following a meal. 90 tablet 1  . potassium chloride SA (K-DUR) 20 MEQ tablet TAKE ONE-HALF TABLET BY MOUTH ONE TIME DAILY 90 tablet 1  . [DISCONTINUED] losartan (COZAAR) 100 MG tablet Take 1 tablet (100 mg  total) by mouth daily. 90 tablet 1   No current facility-administered medications on file prior to visit.    BP (!) 182/65 (BP Location: Right Arm, Patient Position: Sitting, Cuff Size: Large)   Pulse 72   Temp 97.8 F (36.6 C) (Temporal)   Resp 16   Ht 5\' 7"  (1.702 m)   Wt 179 lb (81.2 kg)   SpO2 100%   BMI 28.04 kg/m       Objective:   Physical Exam Constitutional:      Appearance: She is well-developed.  Neck:     Thyroid: No thyromegaly.  Cardiovascular:     Rate and Rhythm: Normal rate and regular rhythm.     Heart sounds: Normal heart sounds. No murmur heard.   Pulmonary:     Effort: Pulmonary effort is normal. No respiratory distress.     Breath sounds: Normal breath sounds. No wheezing.  Musculoskeletal:     Cervical back: Neck supple.  Skin:    General: Skin is warm and dry.  Neurological:     Mental Status: She is alert and oriented to person, place, and time.  Psychiatric:        Behavior: Behavior normal.        Thought Content: Thought content normal.        Judgment: Judgment normal.           Assessment & Plan:  HTN- I spoke privately with her daughter about her med compliance. She has a lot of difficulty with her memory.  I do not think that she is taking her meds as prescribed.  I have asked her daughter to put her mom's meds in a tray and supervise administration.    Memory loss- she is also having reports of some visual hallucinations according to the daughter.   Will refer to geri-psych for further evaluation. Referral to neuro for further evaluation as well.    DM2- clinically stable, obtain follow up A1C and bmet.   This visit occurred during the SARS-CoV-2 public health emergency.  Safety protocols were in place, including screening questions prior to the visit, additional usage of staff PPE, and extensive cleaning of exam room while observing appropriate contact time as indicated for disinfecting solutions.

## 2020-06-18 NOTE — Patient Instructions (Signed)
Please complete lab work prior to leaving.   

## 2020-06-19 ENCOUNTER — Telehealth: Payer: Self-pay | Admitting: Family

## 2020-06-19 ENCOUNTER — Other Ambulatory Visit: Payer: Self-pay

## 2020-06-19 LAB — BASIC METABOLIC PANEL
BUN: 20 mg/dL (ref 6–23)
CO2: 28 mEq/L (ref 19–32)
Calcium: 9.3 mg/dL (ref 8.4–10.5)
Chloride: 103 mEq/L (ref 96–112)
Creatinine, Ser: 1.52 mg/dL — ABNORMAL HIGH (ref 0.40–1.20)
GFR: 39.69 mL/min — ABNORMAL LOW (ref >60.00)
Glucose, Bld: 87 mg/dL (ref 70–99)
Potassium: 5 mEq/L (ref 3.5–5.1)
Sodium: 138 mEq/L (ref 135–145)

## 2020-06-19 LAB — HEMOGLOBIN A1C: Hgb A1c MFr Bld: 5.8 % (ref 4.6–6.5)

## 2020-06-19 MED ORDER — ACETAMINOPHEN 500 MG PO TABS: 1000.0000 mg | ORAL_TABLET | Freq: Two times a day (BID) | ORAL | 0 refills | Status: AC

## 2020-06-19 MED ORDER — FUROSEMIDE 40 MG PO TABS: 40.0000 mg | ORAL_TABLET | Freq: Every day | ORAL | 1 refills | Status: DC

## 2020-06-19 NOTE — Telephone Encounter (Signed)
Daughter sent the following message:  Hi, She has NO refills on... Furosemide Atorvastatin Potassium Chloride and I don't see any pill bottles for... Hydralazine Choleciferol Amlodipine Acetaminophen  Thanks in advance  [employer], can you please check her refills and send if needed- I think I sent most of these yesterday.

## 2020-06-19 NOTE — Telephone Encounter (Signed)
Pharmacy was missing refill of Furosemide and tylenol, both sent at Patient's daughter request. She will get vit d otc.

## 2020-06-20 ENCOUNTER — Encounter: Payer: Self-pay | Admitting: Neurology

## 2020-06-27 ENCOUNTER — Ambulatory Visit: Payer: Medicare HMO | Admitting: Family

## 2020-07-02 ENCOUNTER — Ambulatory Visit: Payer: Medicare HMO | Admitting: Family

## 2020-07-04 ENCOUNTER — Encounter: Payer: Self-pay | Admitting: Family

## 2020-07-04 ENCOUNTER — Ambulatory Visit (INDEPENDENT_AMBULATORY_CARE_PROVIDER_SITE_OTHER): Payer: Medicare HMO | Admitting: Family

## 2020-07-04 ENCOUNTER — Other Ambulatory Visit: Payer: Self-pay

## 2020-07-04 VITALS — BP 131/49 | HR 50 | Temp 98.6°F | Resp 16 | Wt 178.8 lb

## 2020-07-04 DIAGNOSIS — I10 Essential (primary) hypertension: Secondary | ICD-10-CM | POA: Diagnosis not present

## 2020-07-04 NOTE — Patient Instructions (Signed)
Keep up the good work

## 2020-07-04 NOTE — Progress Notes (Signed)
Subjective:    Patient ID: Sherry Perez, female    DOB: 07-08-1941, 79 y.o.   MRN: [id number]  HPI  HTN- Pt presents today for follow up. Reports good compliance with her blood pressure.   BP Readings from Last 3 Encounters:  07/04/20 (!) 131/49  06/18/20 (!) 182/65  03/14/20 (!) 138/47       Review of Systems See HPI  Past Medical History:  Diagnosis Date  . Diabetes mellitus without complication ([place])   . HTN (hypertension)   . Hyperlipidemia      Social History   Socioeconomic History  . Marital status: Widowed    Spouse name: Not on file  . Number of children: Not on file  . Years of education: Not on file  . Highest education level: Not on file  Occupational History  . Not on file  Tobacco Use  . Smoking status: Never Smoker  . Smokeless tobacco: Never Used  Vaping Use  . Vaping Use: Never used  Substance and Sexual Activity  . Alcohol use: No    Alcohol/week: 0.0 standard drinks  . Drug use: Never  . Sexual activity: Not on file  Other Topics Concern  . 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 Difficulty of Paying Living Expenses:   Food Insecurity:   . Worried About Charity fundraiser in the Last Year:   . Arboriculturist in the Last Year:   Transportation Needs:   . Film/video editor (Medical):   Marland Kitchen Lack of Transportation (Non-Medical):   Physical Activity:   . Days of Exercise per Week:   . Minutes of Exercise per Session:   Stress:   . Feeling of Stress :   Social Connections:   . Frequency of Communication with Friends and Family:   . Frequency of Social Gatherings with Friends and Family:   . Attends Religious Services:   . Active Member of Clubs  or Organizations:   . Attends Archivist Meetings:   Marland Kitchen Marital Status:   Intimate Partner Violence:   . Fear of Current or Ex-Partner:   . Emotionally Abused:   Marland Kitchen Physically Abused:   . Sexually Abused:     Past Surgical History:  Procedure Laterality Date  . ABDOMINAL HYSTERECTOMY      Family History  Problem Relation Age of Onset  . Diabetes Mother     Allergies  Allergen Reactions  . Lisinopril     Renal insufficiency    Current Outpatient Medications on File Prior to Visit  Medication Sig Dispense Refill  . acetaminophen (TYLENOL) 500 MG tablet Take 2 tablets (1,000 mg total) by mouth 2 (two) times daily. 30 tablet 0  . amLODipine (NORVASC) 10 MG tablet Take 1 tablet (10 mg total) by mouth daily. 90 tablet 1  . aspirin 81 MG tablet Take 81 mg by mouth daily.    Marland Kitchen atorvastatin (LIPITOR) 80 MG tablet Take 1 tablet (80 mg total) by mouth daily. 90 tablet 1  . cholecalciferol (VITAMIN D) 1000 units tablet Take 1,000 Units by mouth daily.    . furosemide (LASIX) 40 MG tablet Take  1 tablet (40 mg total) by mouth daily. 90 tablet 1  . hydrALAZINE (APRESOLINE) 25 MG tablet Take 1 tablet (25 mg total) by mouth 3 (three) times daily. 270 tablet 1  . losartan (COZAAR) 100 MG tablet Take 1 tablet (100 mg total) by mouth daily. 90 tablet 1  . metoprolol succinate (TOPROL-XL) 100 MG 24 hr tablet Take 1 tablet (100 mg total) by mouth daily. Take with or immediately following a meal. 90 tablet 1  . potassium chloride SA (KLOR-CON) 20 MEQ tablet TAKE ONE-HALF TABLET BY MOUTH ONE TIME DAILY 45 tablet 1  . [DISCONTINUED] losartan (COZAAR) 100 MG tablet Take 1 tablet (100 mg total) by mouth daily. 90 tablet 1   No current facility-administered medications on file prior to visit.    BP (!) 131/49 (BP Location: Right Arm, Patient Position: Sitting, Cuff Size: Large)   Pulse (!) 50   Temp 98.6 F (37 C) (Oral)   Resp 16   Wt 178 lb 12.8 oz (81.1 kg)   SpO2 100%   BMI 28.00  kg/m       Objective:   Physical Exam Constitutional:      Appearance: She is well-developed.  Cardiovascular:     Rate and Rhythm: Normal rate and regular rhythm.     Heart sounds: Normal heart sounds. No murmur heard.   Pulmonary:     Effort: Pulmonary effort is normal. No respiratory distress.     Breath sounds: Normal breath sounds. No wheezing.  Psychiatric:        Behavior: Behavior normal.        Thought Content: Thought content normal.        Judgment: Judgment normal.           Assessment & Plan:  HTN- bp improved now that she is back on her medications regularly. Continue current meds.  This visit occurred during the SARS-CoV-2 public health emergency.  Safety protocols were in place, including screening questions prior to the visit, additional usage of staff PPE, and extensive cleaning of exam room while observing appropriate contact time as indicated for disinfecting solutions.

## 2020-08-13 DIAGNOSIS — H2512 Age-related nuclear cataract, left eye: Secondary | ICD-10-CM | POA: Diagnosis not present

## 2020-08-13 DIAGNOSIS — H524 Presbyopia: Secondary | ICD-10-CM | POA: Diagnosis not present

## 2020-08-13 DIAGNOSIS — H35033 Hypertensive retinopathy, bilateral: Secondary | ICD-10-CM | POA: Diagnosis not present

## 2020-08-13 DIAGNOSIS — H26491 Other secondary cataract, right eye: Secondary | ICD-10-CM | POA: Diagnosis not present

## 2020-09-12 ENCOUNTER — Other Ambulatory Visit: Payer: Self-pay | Admitting: Family

## 2020-09-13 ENCOUNTER — Other Ambulatory Visit: Payer: Self-pay | Admitting: Family

## 2020-09-26 ENCOUNTER — Ambulatory Visit: Payer: Medicare HMO | Admitting: Neurology

## 2020-10-10 ENCOUNTER — Encounter: Payer: Self-pay | Admitting: Family

## 2020-10-10 ENCOUNTER — Other Ambulatory Visit: Payer: Self-pay

## 2020-10-10 ENCOUNTER — Ambulatory Visit: Payer: Medicare HMO | Admitting: Family

## 2020-10-10 ENCOUNTER — Ambulatory Visit (INDEPENDENT_AMBULATORY_CARE_PROVIDER_SITE_OTHER): Payer: Medicare HMO | Admitting: Family

## 2020-10-10 VITALS — BP 129/58 | HR 59 | Temp 99.0°F | Resp 16 | Ht 67.0 in | Wt 176.0 lb

## 2020-10-10 DIAGNOSIS — I10 Essential (primary) hypertension: Secondary | ICD-10-CM

## 2020-10-10 DIAGNOSIS — E1169 Type 2 diabetes mellitus with other specified complication: Secondary | ICD-10-CM | POA: Diagnosis not present

## 2020-10-10 DIAGNOSIS — E119 Type 2 diabetes mellitus without complications: Secondary | ICD-10-CM

## 2020-10-10 DIAGNOSIS — Z23 Encounter for immunization: Secondary | ICD-10-CM | POA: Diagnosis not present

## 2020-10-10 DIAGNOSIS — E785 Hyperlipidemia, unspecified: Secondary | ICD-10-CM | POA: Diagnosis not present

## 2020-10-10 NOTE — Progress Notes (Signed)
Subjective:    Patient ID: LAKEDRA WASHINGTON, female    DOB: Mar 20, 1941, 79 y.o.   MRN: [id number]  HPI  Patient is an 79 yr old female who presents today for follow up.  HTN- maintained on amlodipine 10mg , hydralazine 25mg  tid, losartan 100mg , and toprol xl 100mg .  BP Readings from Last 3 Encounters:  10/10/20 (!) 129/58  07/04/20 (!) 131/49  06/18/20 (!) 182/65   DM2- diet controlled. Reports that she is eating healthy.   Lab Results  Component Value Date   HGBA1C 5.8 06/18/2020   HGBA1C 5.5 10/09/2019   HGBA1C 6.0 07/10/2019   Lab Results  Component Value Date   MICROALBUR 0.7 06/07/2016   LDLCALC 99 07/10/2019   CREATININE 1.52 (H) 06/18/2020      Review of Systems See HPI  Past Medical History:  Diagnosis Date  . Diabetes mellitus without complication ([place])   . HTN (hypertension)   . Hyperlipidemia      Social History   Socioeconomic History  . Marital status: Widowed    Spouse name: Not on file  . Number of children: Not on file  . Years of education: Not on file  . Highest education level: Not on file  Occupational History  . Not on file  Tobacco Use  . Smoking status: Never Smoker  . Smokeless tobacco: Never Used  Vaping Use  . Vaping Use: Never used  Substance and Sexual Activity  . Alcohol use: No    Alcohol/week: 0.0 standard drinks  . Drug use: Never  . Sexual activity: Not on file  Other Topics Concern  . 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 Difficulty of Paying Living Expenses: Not on file  Food Insecurity:   . Worried About Charity fundraiser in the Last Year: Not on file  . Ran Out of Food in the Last Year: Not on file  Transportation Needs:   .  Lack of Transportation (Medical): Not on file  . Lack of Transportation (Non-Medical): Not on file  Physical Activity:   . Days of Exercise per Week: Not on file  . Minutes of Exercise per Session: Not on file  Stress:   . Feeling of Stress : Not on file  Social Connections:   . Frequency of Communication with Friends and Family: Not on file  . Frequency of Social Gatherings with Friends and Family: Not on file  . Attends Religious Services: Not on file  . Active Member of Clubs or Organizations: Not on file  . Attends Archivist Meetings: Not on file  . Marital Status: Not on file  Intimate Partner Violence:   . Fear of Current or Ex-Partner: Not on file  . Emotionally Abused: Not on file  . Physically Abused: Not on file  . Sexually Abused: Not on file    Past Surgical History:  Procedure Laterality Date  . ABDOMINAL HYSTERECTOMY      Family History  Problem Relation Age of Onset  . Diabetes Mother     Allergies  Allergen Reactions  . Lisinopril     Renal insufficiency    Current Outpatient Medications on File  Prior to Visit  Medication Sig Dispense Refill  . acetaminophen (TYLENOL) 500 MG tablet Take 2 tablets (1,000 mg total) by mouth 2 (two) times daily. 30 tablet 0  . amLODipine (NORVASC) 10 MG tablet Take 1 tablet (10 mg total) by mouth daily. 90 tablet 1  . aspirin 81 MG tablet Take 81 mg by mouth daily.    Marland Kitchen atorvastatin (LIPITOR) 80 MG tablet TAKE ONE TABLET BY MOUTH ONE TIME DAILY 90 tablet 1  . cholecalciferol (VITAMIN D) 1000 units tablet Take 1,000 Units by mouth daily.    . furosemide (LASIX) 40 MG tablet TAKE ONE TABLET BY MOUTH ONE TIME DAILY 90 tablet 1  . hydrALAZINE (APRESOLINE) 25 MG tablet Take 1 tablet (25 mg total) by mouth 3 (three) times daily. 270 tablet 1  . losartan (COZAAR) 100 MG tablet TAKE ONE TABLET BY MOUTH ONE TIME DAILY 90 tablet 1  . metoprolol succinate (TOPROL-XL) 100 MG 24 hr tablet TAKE ONE TABLET BY MOUTH ONE TIME  DAILY . TAKE WITH OR IMMEDIATELY FOLLOWING A MEAL 90 tablet 1  . potassium chloride SA (KLOR-CON) 20 MEQ tablet TAKE ONE-HALF TABLET BY MOUTH ONE TIME DAILY 45 tablet 1  . [DISCONTINUED] losartan (COZAAR) 100 MG tablet Take 1 tablet (100 mg total) by mouth daily. 90 tablet 1   No current facility-administered medications on file prior to visit.    BP (!) 129/58 (BP Location: Right Arm, Patient Position: Sitting, Cuff Size: Small)   Pulse (!) 59   Temp 99 F (37.2 C) (Oral)   Resp 16   Ht 5\' 7"  (1.702 m)   Wt 176 lb (79.8 kg)   SpO2 100%   BMI 27.57 kg/m       Objective:   Physical Exam Constitutional:      Appearance: She is well-developed.  Cardiovascular:     Rate and Rhythm: Normal rate and regular rhythm.     Heart sounds: Normal heart sounds. No murmur heard.   Pulmonary:     Effort: Pulmonary effort is normal. No respiratory distress.     Breath sounds: Normal breath sounds. No wheezing.  Psychiatric:        Behavior: Behavior normal.        Thought Content: Thought content normal.        Judgment: Judgment normal.           Assessment & Plan:  HTN- bp looks great. Continue amlodipine 10mg , hydralazine 25mg  tid, losartan 100mg , and toprol xl 100mg .   DM2- diet controlled. Obtain A1C. Refer to ophthalmology.   Hyperlipidemia- maintained on atorvastatin 80mg . Last LDL at goal. Will obtain follow up lipid panel.  Lab Results  Component Value Date   CHOL 195 07/10/2019   HDL 80.30 07/10/2019   LDLCALC 99 07/10/2019   TRIG 82.0 07/10/2019   CHOLHDL 2 07/10/2019   Flu shot today.  This visit occurred during the SARS-CoV-2 public health emergency.  Safety protocols were in place, including screening questions prior to the visit, additional usage of staff PPE, and extensive cleaning of exam room while observing appropriate contact time as indicated for disinfecting solutions.

## 2020-10-10 NOTE — Patient Instructions (Addendum)
Please complete lab work prior to leaving. You should be contacted about scheduling your appointment with the eye doctor.  Please get your COVID-19 booster shot if you have not already received it.

## 2020-10-11 LAB — HEMOGLOBIN A1C
Hgb A1c MFr Bld: 5.5 % of total Hgb (ref <5.7)
Mean Plasma Glucose: 111 (calc)
eAG (mmol/L): 6.2 (calc)

## 2020-10-11 LAB — COMPREHENSIVE METABOLIC PANEL
AG Ratio: 1.3 (calc) (ref 1.0–2.5)
ALT: 10 U/L (ref 6–29)
AST: 17 U/L (ref 10–35)
Albumin: 4.1 g/dL (ref 3.6–5.1)
Alkaline phosphatase (APISO): 62 U/L (ref 37–153)
BUN/Creatinine Ratio: 13 (calc) (ref 6–22)
BUN: 30 mg/dL — ABNORMAL HIGH (ref 7–25)
CO2: 23 mmol/L (ref 20–32)
Calcium: 8.9 mg/dL (ref 8.6–10.4)
Chloride: 99 mmol/L (ref 98–110)
Creat: 2.24 mg/dL — ABNORMAL HIGH (ref 0.60–0.88)
Globulin: 3.2 g/dL (calc) (ref 1.9–3.7)
Glucose, Bld: 83 mg/dL (ref 65–99)
Potassium: 4.6 mmol/L (ref 3.5–5.3)
Sodium: 135 mmol/L (ref 135–146)
Total Bilirubin: 0.4 mg/dL (ref 0.2–1.2)
Total Protein: 7.3 g/dL (ref 6.1–8.1)

## 2020-10-11 LAB — LIPID PANEL
Cholesterol: 198 mg/dL (ref <200)
HDL: 79 mg/dL (ref >=50)
LDL Cholesterol (Calc): 104 mg/dL (calc) — ABNORMAL HIGH
Non-HDL Cholesterol (Calc): 119 mg/dL (calc) (ref <130)
Total CHOL/HDL Ratio: 2.5 (calc) (ref <5.0)
Triglycerides: 64 mg/dL (ref <150)

## 2020-10-13 ENCOUNTER — Telehealth: Payer: Self-pay | Admitting: Family

## 2020-10-13 ENCOUNTER — Encounter: Payer: Self-pay | Admitting: *Deleted

## 2020-10-13 NOTE — Telephone Encounter (Signed)
Please contact pt and ask her if she is taking any anti-inflammatories.  If so, please discontinue. Also any urinary symptoms (burning frequency, difficulty emptying her bladder?)  Her kidney function has worsened since it was last checked. Could you please forward a copy of her lab work to her nephrologist (Dr. Vanetta Mulders?)

## 2020-10-13 NOTE — Telephone Encounter (Signed)
Left message on machine to call back.  Copy of results sent over to Dr. Fanny Bien office.

## 2020-10-14 NOTE — Telephone Encounter (Signed)
Spoke with patient's daughter during nurse visit regarding results. She verbalized understand. Will advise her if any further instructions needed.

## 2020-11-04 ENCOUNTER — Telehealth: Payer: Self-pay | Admitting: Family

## 2020-11-04 NOTE — Telephone Encounter (Signed)
Dr. Moshe Cipro- thank you. FYI, She is already currently on amlodipine 10mg . Thanks, [employer]

## 2020-11-04 NOTE — Telephone Encounter (Signed)
Per Mechele Claude w/ [facility] would like [name] Np to know that Dr Otis Peak is stopping patient's  Losartan and adding Amlodipine  5 mg  To protect her kidneys.

## 2020-11-06 DIAGNOSIS — Z961 Presence of intraocular lens: Secondary | ICD-10-CM | POA: Diagnosis not present

## 2020-11-06 DIAGNOSIS — H2512 Age-related nuclear cataract, left eye: Secondary | ICD-10-CM | POA: Diagnosis not present

## 2020-11-06 DIAGNOSIS — H25042 Posterior subcapsular polar age-related cataract, left eye: Secondary | ICD-10-CM | POA: Diagnosis not present

## 2020-11-06 DIAGNOSIS — H18413 Arcus senilis, bilateral: Secondary | ICD-10-CM | POA: Diagnosis not present

## 2020-11-11 ENCOUNTER — Telehealth: Payer: Self-pay | Admitting: Family

## 2020-11-11 NOTE — Telephone Encounter (Signed)
Could you please contact pt's daughter and ask if she can check her blood pressure and call us with her reading?

## 2020-11-11 NOTE — Telephone Encounter (Signed)
Patient's daughter advised to obtain bp reading and send to provider. She verbalized understanding and will try to get today.

## 2020-12-12 ENCOUNTER — Other Ambulatory Visit: Payer: Self-pay | Admitting: Family

## 2020-12-30 ENCOUNTER — Ambulatory Visit: Payer: Medicare HMO | Admitting: Neurology

## 2021-01-05 ENCOUNTER — Ambulatory Visit: Payer: Medicare HMO

## 2021-01-12 ENCOUNTER — Ambulatory Visit: Payer: Medicare HMO | Attending: Internal Medicine

## 2021-01-12 ENCOUNTER — Other Ambulatory Visit (HOSPITAL_BASED_OUTPATIENT_CLINIC_OR_DEPARTMENT_OTHER): Payer: Self-pay | Admitting: Internal Medicine

## 2021-01-12 DIAGNOSIS — Z23 Encounter for immunization: Secondary | ICD-10-CM

## 2021-01-12 MED FILL — MODERNA COVID-19 VACCINE 10: 100 | 28 days supply | Qty: 0 | Fill #0

## 2021-01-12 NOTE — Progress Notes (Signed)
   Covid-19 Vaccination Clinic  Name:  Sherry Perez    MRN: [id number] DOB: Aug 10, 1941  01/12/2021  Ms. Sherry Perez was observed post Covid-19 immunization for 15 minutes without incident. She was provided with Vaccine Information Sheet and instruction to access the V-Safe system.   Ms. Sherry Perez was instructed to call 911 with any severe reactions post vaccine: Marland Kitchen Difficulty breathing  . Swelling of face and throat  . A fast heartbeat  . A bad rash all over body  . Dizziness and weakness   Immunizations Administered    Name Date Dose VIS Date Route   Moderna Covid-19 Booster Vaccine 01/12/2021  2:21 PM 0.25 mL 10/08/2020 Intramuscular   Manufacturer: Levan Hurst   Lot: [id number]   [id number]

## 2021-01-13 ENCOUNTER — Ambulatory Visit: Payer: Medicare HMO | Admitting: Family

## 2021-02-04 DIAGNOSIS — E785 Hyperlipidemia, unspecified: Secondary | ICD-10-CM | POA: Diagnosis not present

## 2021-02-04 DIAGNOSIS — I10 Essential (primary) hypertension: Secondary | ICD-10-CM | POA: Diagnosis not present

## 2021-02-04 DIAGNOSIS — Z7902 Long term (current) use of antithrombotics/antiplatelets: Secondary | ICD-10-CM | POA: Diagnosis not present

## 2021-02-04 DIAGNOSIS — E1136 Type 2 diabetes mellitus with diabetic cataract: Secondary | ICD-10-CM | POA: Diagnosis not present

## 2021-02-04 DIAGNOSIS — Z888 Allergy status to other drugs, medicaments and biological substances status: Secondary | ICD-10-CM | POA: Diagnosis not present

## 2021-02-04 DIAGNOSIS — Z79899 Other long term (current) drug therapy: Secondary | ICD-10-CM | POA: Diagnosis not present

## 2021-02-04 DIAGNOSIS — H2512 Age-related nuclear cataract, left eye: Secondary | ICD-10-CM | POA: Diagnosis not present

## 2021-02-04 HISTORY — PX: OTHER SURGICAL HISTORY: SHX169

## 2021-02-10 ENCOUNTER — Other Ambulatory Visit: Payer: Self-pay

## 2021-02-10 ENCOUNTER — Encounter: Payer: Self-pay | Admitting: Family

## 2021-02-10 ENCOUNTER — Ambulatory Visit (INDEPENDENT_AMBULATORY_CARE_PROVIDER_SITE_OTHER): Payer: Medicare HMO | Admitting: Family

## 2021-02-10 VITALS — BP 163/62 | HR 81 | Temp 98.8°F | Resp 16 | Ht 67.0 in | Wt 178.2 lb

## 2021-02-10 DIAGNOSIS — E118 Type 2 diabetes mellitus with unspecified complications: Secondary | ICD-10-CM

## 2021-02-10 DIAGNOSIS — I10 Essential (primary) hypertension: Secondary | ICD-10-CM

## 2021-02-10 LAB — BASIC METABOLIC PANEL
BUN: 16 mg/dL (ref 6–23)
CO2: 27 mEq/L (ref 19–32)
Calcium: 9.7 mg/dL (ref 8.4–10.5)
Chloride: 103 mEq/L (ref 96–112)
Creatinine, Ser: 1.45 mg/dL — ABNORMAL HIGH (ref 0.40–1.20)
GFR: 33.74 mL/min — ABNORMAL LOW (ref >60.00)
Glucose, Bld: 82 mg/dL (ref 70–99)
Potassium: 5 mEq/L (ref 3.5–5.1)
Sodium: 140 mEq/L (ref 135–145)

## 2021-02-10 MED ORDER — METOPROLOL SUCCINATE ER 100 MG PO TB24: ORAL_TABLET | ORAL | 1 refills | Status: DC

## 2021-02-10 MED ORDER — POTASSIUM CHLORIDE CRYS ER 20 MEQ PO TBCR: 10.0000 meq | EXTENDED_RELEASE_TABLET | Freq: Every day | ORAL | 1 refills | Status: DC

## 2021-02-10 MED ORDER — HYDRALAZINE HCL 25 MG PO TABS: 25.0000 mg | ORAL_TABLET | Freq: Three times a day (TID) | ORAL | 1 refills | Status: DC

## 2021-02-10 NOTE — Patient Instructions (Signed)
Please complete lab work prior to leaving.   

## 2021-02-10 NOTE — Progress Notes (Signed)
Subjective:    Patient ID: Sherry Perez, female    DOB: 1941/09/12, 80 y.o.   MRN: [id number]  HPI  Patient is an 80 yr old female who presents today for follow up.  HTN- maintained on amlodipine '10mg'$ , hydralazine '25mg'$  tid, losartan '100mg'$ , and toprol xl '100mg'$ . BP Readings from Last 3 Encounters:  02/10/21 (!) 163/62  10/10/20 (!) 129/58  07/04/20 (!) 131/49   DM2- diet controlled. Lab Results  Component Value Date   HGBA1C 5.5 10/10/2020   HGBA1C 5.8 06/18/2020   HGBA1C 5.5 10/09/2019   Lab Results  Component Value Date   MICROALBUR 0.7 06/07/2016   LDLCALC 104 (H) 10/10/2020   CREATININE 2.24 (H) 10/10/2020     Review of Systems    see HPI  Past Medical History:  Diagnosis Date  . Diabetes mellitus without complication ([place])   . HTN (hypertension)   . Hyperlipidemia      Social History   Socioeconomic History  . Marital status: Widowed    Spouse name: Not on file  . Number of children: Not on file  . Years of education: Not on file  . Highest education level: Not on file  Occupational History  . Not on file  Tobacco Use  . Smoking status: Never Smoker  . Smokeless tobacco: Never Used  Vaping Use  . Vaping Use: Never used  Substance and Sexual Activity  . Alcohol use: No    Alcohol/week: 0.0 standard drinks  . Drug use: Never  . Sexual activity: Not on file  Other Topics Concern  . 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 ABDOMINAL HYSTERECTOMY      Family History  Problem Relation Age of Onset  . Diabetes Mother     Allergies  Allergen Reactions  . Lisinopril     Renal insufficiency    Current Outpatient Medications on File Prior to Visit  Medication Sig Dispense Refill  . acetaminophen (TYLENOL) 500 MG tablet Take 2 tablets (1,000 mg total) by mouth 2 (two) times daily. 30 tablet 0  . amLODipine (NORVASC) 10 MG tablet TAKE ONE TABLET BY MOUTH ONE TIME DAILY 90 tablet 1  . aspirin 81 MG tablet Take 81 mg by mouth daily.    Marland Kitchen atorvastatin (LIPITOR) 80 MG tablet TAKE ONE TABLET BY MOUTH ONE TIME DAILY 90 tablet 1  . cholecalciferol (VITAMIN D) 1000 units tablet Take 1,000 Units by mouth daily.    . furosemide (LASIX) 40 MG tablet TAKE ONE TABLET BY MOUTH ONE TIME DAILY 90 tablet 1   No current facility-administered medications on file prior to visit.    BP (!) 163/62 (BP Location: Right Arm, Patient Position: Sitting, Cuff Size: Small)   Pulse 81   Temp 98.8 F (37.1 C) (Oral)   Resp 16   Ht '5\' 7"'$  (1.702 m)   Wt 178 lb  3.2 oz (80.8 kg)   SpO2 100%   BMI 27.91 kg/m    Objective:   Physical Exam Constitutional:      Appearance: She is well-developed and well-nourished.  Neck:     Thyroid: No thyromegaly.  Cardiovascular:     Rate and Rhythm: Normal rate and regular rhythm.     Heart sounds: Normal heart sounds. No murmur heard.   Pulmonary:     Effort: Pulmonary effort is normal. No respiratory distress.     Breath sounds: Normal breath sounds. No wheezing.  Musculoskeletal:     Cervical back: Neck supple.  Skin:    General: Skin is warm and dry.  Neurological:     Mental Status: She is alert and oriented to person, place, and time.  Psychiatric:        Mood and Affect: Mood and affect normal.        Behavior: Behavior normal.        Thought Content: Thought content normal.        Judgment: Judgment normal.           Assessment & Plan:  HTN- bp  uncontrolled. I don't think she has been taking hydralazine. I have refilled meds as needed. Plan to restart hydralazine and follow back up in 1 month. I will also discuss with her daughter who does her medications. Obtain follow up bmet.  DM2- A1C has been stable. Continue diabetic diet.  This visit occurred during the SARS-CoV-2 public health emergency.  Safety protocols were in place, including screening questions prior to the visit, additional usage of staff PPE, and extensive cleaning of exam room while observing appropriate contact time as indicated for disinfecting solutions.

## 2021-02-27 ENCOUNTER — Other Ambulatory Visit: Payer: Self-pay

## 2021-02-27 MED ORDER — ATORVASTATIN CALCIUM 80 MG PO TABS: ORAL_TABLET | ORAL | 1 refills | Status: DC

## 2021-03-05 DIAGNOSIS — Z0100 Encounter for examination of eyes and vision without abnormal findings: Secondary | ICD-10-CM | POA: Diagnosis not present

## 2021-03-10 ENCOUNTER — Ambulatory Visit (INDEPENDENT_AMBULATORY_CARE_PROVIDER_SITE_OTHER): Payer: Medicare HMO | Admitting: Family

## 2021-03-10 ENCOUNTER — Ambulatory Visit: Payer: Medicare HMO | Admitting: Family

## 2021-03-10 ENCOUNTER — Other Ambulatory Visit: Payer: Self-pay

## 2021-03-10 VITALS — BP 159/82 | HR 54 | Temp 99.1°F | Resp 16 | Ht 67.0 in | Wt 183.0 lb

## 2021-03-10 DIAGNOSIS — I10 Essential (primary) hypertension: Secondary | ICD-10-CM

## 2021-03-10 NOTE — Progress Notes (Signed)
Subjective:    Patient ID: Sherry Perez, female    DOB: January 07, 1941, 80 y.o.   MRN: [id number]  HPI  Patient is an 80 yr old female who presents today for follow up of her blood pressure.  Last visit we noted that she was not taking hydralazine. We restarted hydralazine. She tells me that she is taking all of her medications as prescribed.    Review of Systems   See HPI Past Medical History:  Diagnosis Date  . Diabetes mellitus without complication ([place])   . HTN (hypertension)   . Hyperlipidemia      Social History   Socioeconomic History  . Marital status: Widowed    Spouse name: Not on file  . Number of children: Not on file  . Years of education: Not on file  . Highest education level: Not on file  Occupational History  . Not on file  Tobacco Use  . Smoking status: Never Smoker  . Smokeless tobacco: Never Used  Vaping Use  . Vaping Use: Never used  Substance and Sexual Activity  . Alcohol use: No    Alcohol/week: 0.0 standard drinks  . Drug use: Never  . Sexual activity: Not on file  Other Topics Concern  . 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 ABDOMINAL HYSTERECTOMY    . cataract surgery Left 02/04/2021    Family History  Problem Relation Age of Onset  . Diabetes Mother     Allergies  Allergen Reactions  . Lisinopril     Renal insufficiency    Current Outpatient Medications on File Prior to Visit   Medication Sig Dispense Refill  . acetaminophen (TYLENOL) 500 MG tablet Take 2 tablets (1,000 mg total) by mouth 2 (two) times daily. 30 tablet 0  . amLODipine (NORVASC) 10 MG tablet TAKE ONE TABLET BY MOUTH ONE TIME DAILY 90 tablet 1  . aspirin 81 MG tablet Take 81 mg by mouth daily.    Marland Kitchen atorvastatin (LIPITOR) 80 MG tablet Take 1 80 mg tablet daily 100 tablet 1  . cholecalciferol (VITAMIN D) 1000 units tablet Take 1,000 Units by mouth daily.    . furosemide (LASIX) 40 MG tablet TAKE ONE TABLET BY MOUTH ONE TIME DAILY 90 tablet 1  . hydrALAZINE (APRESOLINE) 25 MG tablet Take 1 tablet (25 mg total) by mouth 3 (three) times daily. 270 tablet 1  . metoprolol succinate (TOPROL-XL) 100 MG 24 hr tablet TAKE ONE TABLET BY MOUTH ONE TIME DAILY . TAKE WITH OR IMMEDIATELY FOLLOWING A MEAL 90 tablet 1  . potassium chloride SA (KLOR-CON) 20 MEQ tablet Take 0.5 tablets (10 mEq total) by mouth daily. 45 tablet 1   No current facility-administered medications on file prior to visit.    BP (!) 159/82 (BP Location: Right Arm, Patient Position: Sitting, Cuff Size: Small)   Pulse (!) 54   Temp  99.1 F (37.3 C) (Oral)   Resp 16   Ht '5\' 7"'$  (1.702 m)   Wt 183 lb (83 kg)   SpO2 100%   BMI 28.66 kg/m       Objective:   Physical Exam Constitutional:      Appearance: She is well-developed.  Cardiovascular:     Rate and Rhythm: Normal rate and regular rhythm.     Heart sounds: Normal heart sounds. No murmur heard.   Pulmonary:     Effort: Pulmonary effort is normal. No respiratory distress.     Breath sounds: Normal breath sounds. No wheezing.  Psychiatric:        Behavior: Behavior normal.        Thought Content: Thought content normal.        Judgment: Judgment normal.           Assessment & Plan:  Uncontrolled HTN- looks better today but still above goal despite 4 agents (hydralazine '25mg'$ , amlodipine '10mg'$ , toprol xl '100mg'$  and fuosemide '40mg'$ ).  Will continue current meds, refer for renal  artery doppler to rule out renal artery stenosis and will also refer her to the Advanced HTN clinic.  This visit occurred during the SARS-CoV-2 public health emergency.  Safety protocols were in place, including screening questions prior to the visit, additional usage of staff PPE, and extensive cleaning of exam room while observing appropriate contact time as indicated for disinfecting solutions.

## 2021-03-10 NOTE — Patient Instructions (Signed)
Please continue your current medications. You should be contacted about scheduling the ultrasound of your kidneys and your referral to the Hypertension Clinic.

## 2021-03-11 ENCOUNTER — Ambulatory Visit: Payer: Medicare HMO | Admitting: Neurology

## 2021-04-07 NOTE — Progress Notes (Signed)
[facility] [address], [place], [place] [address] [phone]  Date:  04/08/2021   Name:  Sherry Perez   DOB:  1941/07/21   MRN:  [id number]  PCP:  [name], NP    Chief Complaint: Chest Congestion (Off and on for months, congestion, coughing, no sob, negative covid)   History of Present Illness:  Sherry Perez is a 80 y.o. very pleasant female patient who presents with the following:  Primary pt of [name], here today with concern of illness History of well controled DM, HTN, hyperlipidemia I have not seen her myself previously   covid series and booster complete   Lab Results  Component Value Date   HGBA1C 5.5 10/10/2020   Pt notes sx of a cough and cold- she notes some cough; "every now and then" History is limited by dementia No fever noted No vomiting or diarrhea She notes cough "off and on"-states that really she feels fine She does not drive any longer   I spoke with her daughter Sherry Perez who was in the waiting room.  She notes that her mother's memory loss is at baseline, no change.  She became concerned about cough and scheduled this appointment   Patient Active Problem List   Diagnosis Date Noted  . Right knee pain 06/12/2018  . Right shoulder pain 11/07/2017  . Osteoarthritis 09/13/2016  . Diabetes type 2, controlled ([place]) 06/07/2016  . HTN (hypertension) 06/07/2016  . Vitamin D deficiency 06/07/2016  . Hyperlipidemia 06/07/2016    Past Medical History:  Diagnosis Date  . Diabetes mellitus without complication ([place])   . HTN (hypertension)   . Hyperlipidemia     Past Surgical History:  Procedure Laterality Date  . ABDOMINAL HYSTERECTOMY    . cataract surgery Left 02/04/2021    Social History   Tobacco Use  . Smoking status: Never Smoker  . Smokeless tobacco: Never Used  Vaping Use  . Vaping Use: Never used  Substance Use Topics  . Alcohol use: No     Alcohol/week: 0.0 standard drinks  . Drug use: Never    Family History  Problem Relation Age of Onset  . Diabetes Mother     Allergies  Allergen Reactions  . Lisinopril     Renal insufficiency    Medication list has been reviewed and updated.  Current Outpatient Medications on File Prior to Visit  Medication Sig Dispense Refill  . acetaminophen (TYLENOL) 500 MG tablet Take 2 tablets (1,000 mg total) by mouth 2 (two) times daily. 30 tablet 0  . amLODipine (NORVASC) 10 MG tablet TAKE ONE TABLET BY MOUTH ONE TIME DAILY 90 tablet 1  . aspirin 81 MG tablet Take 81 mg by mouth daily.    Marland Kitchen atorvastatin (LIPITOR) 80 MG tablet Take 1 80 mg tablet daily 100 tablet 1  . cholecalciferol (VITAMIN D) 1000 units tablet Take 1,000 Units by mouth daily.    . furosemide (LASIX) 40 MG tablet TAKE ONE TABLET BY MOUTH ONE TIME DAILY 90 tablet 1  . hydrALAZINE (APRESOLINE) 25 MG tablet Take 1 tablet (25 mg total) by mouth 3 (three) times daily. 270 tablet 1  . metoprolol succinate (TOPROL-XL) 100 MG 24 hr tablet TAKE ONE TABLET BY MOUTH ONE TIME DAILY . TAKE WITH OR IMMEDIATELY FOLLOWING A MEAL 90 tablet 1  . potassium chloride SA (KLOR-CON) 20 MEQ tablet Take 0.5 tablets (10 mEq total) by mouth daily. [place]  tablet 1  . COVID-19 mRNA vaccine, Moderna, 100 MCG/0.5ML injection INJECT AS DIRECTED .25 mL 0   No current facility-administered medications on file prior to visit.    Review of Systems:  As per HPI- otherwise negative.   Physical Examination: Vitals:   04/08/21 1127  BP: (!) 142/82  Pulse: (!) 58  Resp: 18  Temp: 97.8 F (36.6 C)  SpO2: 91%   Vitals:   04/08/21 1127  Weight: 176 lb (79.8 kg)  Height: '5\' 7"'$  (1.702 m)   Body mass index is 27.57 kg/m. Ideal Body Weight: Weight in (lb) to have BMI = 25: 159.3  GEN: no acute distress.  Minimal overweight, looks well HEENT: Atraumatic, Normocephalic.   Bilateral TM wnl, oropharynx normal.  PEERL,EOMI.   Ears and Nose: No external  deformity. CV: RRR, No M/G/R. No JVD. No thrill. No extra heart sounds. PULM: auscultate congestion in the right lower lobe. No retractions. No resp. distress. No accessory muscle use. ABD: S, NT, ND, +BS. No rebound. No HSM. EXTR: No c/c/e PSYCH: Normally interactive. Conversant.   Pulse Readings from Last 3 Encounters:  04/08/21 (!) 58  03/10/21 (!) 54  02/10/21 81   BP Readings from Last 3 Encounters:  04/08/21 (!) 142/82  03/10/21 (!) 159/82  02/10/21 (!) 163/62    Assessment and Plan: Cough - Plan: doxycycline (VIBRAMYCIN) 100 MG capsule, DG Chest 2 View  Dementia without behavioral disturbance, unspecified dementia type [facility])  Patient today with concerns of cough and chest congestion.  History is limited due to patient dementia.  I spoke with her daughter, she has noted cough and became concerned.  On physical exam she does have congestion in the right lower lobe.  I will treat her with doxycycline and obtain chest x-ray today.  We will be in touch with chest x-ray result.  I have asked him to let me know if she is not feeling better soon This visit occurred during the SARS-CoV-2 public health emergency.  Safety protocols were in place, including screening questions prior to the visit, additional usage of staff PPE, and extensive cleaning of exam room while observing appropriate contact time as indicated for disinfecting solutions.    Signed Lamar Blinks, MD

## 2021-04-08 ENCOUNTER — Encounter: Payer: Self-pay | Admitting: Family Medicine

## 2021-04-08 ENCOUNTER — Ambulatory Visit (INDEPENDENT_AMBULATORY_CARE_PROVIDER_SITE_OTHER): Payer: Medicare HMO | Admitting: Family Medicine

## 2021-04-08 ENCOUNTER — Ambulatory Visit (HOSPITAL_BASED_OUTPATIENT_CLINIC_OR_DEPARTMENT_OTHER)
Admission: RE | Admit: 2021-04-08 | Discharge: 2021-04-08 | Disposition: A | Discharge status: Home / Self Care | Payer: Medicare HMO | Source: Ambulatory Visit | Attending: Family Medicine | Admitting: Family Medicine

## 2021-04-08 ENCOUNTER — Other Ambulatory Visit: Payer: Self-pay

## 2021-04-08 VITALS — BP 142/82 | HR 58 | Temp 97.8°F | Resp 18 | Ht 67.0 in | Wt 176.0 lb

## 2021-04-08 DIAGNOSIS — R059 Cough, unspecified: Secondary | ICD-10-CM

## 2021-04-08 DIAGNOSIS — R69 Illness, unspecified: Secondary | ICD-10-CM | POA: Diagnosis not present

## 2021-04-08 DIAGNOSIS — F0390 Unspecified dementia without behavioral disturbance: Secondary | ICD-10-CM | POA: Insufficient documentation

## 2021-04-08 MED ORDER — DOXYCYCLINE HYCLATE 100 MG PO CAPS: 100.0000 mg | ORAL_CAPSULE | Freq: Two times a day (BID) | ORAL | 0 refills | Status: DC

## 2021-04-08 NOTE — Telephone Encounter (Signed)
Spoke with patients daughter on the phone. I did not find it in our office or lab. I advised her to check with the imaging dept since she had xray.

## 2021-04-08 NOTE — Patient Instructions (Signed)
It was very nice to meet you today!  I have ordered an antibiotic for you and also a chest x-ray.  Please stop by imaging on the ground floor for your x-ray on the way out today I will be in touch with this report Please let me know if you are not feeling better soon!

## 2021-04-15 NOTE — Progress Notes (Signed)
Cardiology Office Note   Date:  04/16/2021   ID:  Sherry Perez, DOB Oct 16, 1941, MRN [id number]  PCP:  [name], NP  Cardiologist:   No primary care provider on file. Referring:  [name], NP  Chief Complaint  Patient presents with  . Hypertension      History of Present Illness: Sherry Perez is a 80 y.o. female who presents for evaluation of difficult to control hypertension.  I reviewed her blood pressures and they have been significantly elevated with systolics in the [id number].  There is some suggestion of some endorgan damage with her EKG suggesting some LVH although I do see an echo in 2020 that did not suggest this.  She had a well-preserved ejection fraction.  There was some mild impaired relaxation noted.  She also has a elevated creatinine suggesting some renal involvement.  She lives with her daughter.  There is apparently dementia and she has some trouble answering questions but she is quite pleasant.  Together they did not indicate any acute cardiovascular complaints.  The patient denies any new symptoms such as chest discomfort, neck or arm discomfort. There has been no new shortness of breath, PND or orthopnea. There have been no reported palpitations, presyncope or syncope.   She helps with some of her household chores and walks around the grocery store without complaints.   Past Medical History:  Diagnosis Date  . Diabetes mellitus without complication ([place])   . HTN (hypertension)   . Hyperlipidemia     Past Surgical History:  Procedure Laterality Date  . ABDOMINAL HYSTERECTOMY    . cataract surgery Left 02/04/2021     Current Outpatient Medications  Medication Sig Dispense Refill  . acetaminophen (TYLENOL) 500 MG tablet Take 2 tablets (1,000 mg total) by mouth 2 (two) times daily. 30 tablet 0  . amLODipine (NORVASC) 10 MG tablet TAKE ONE TABLET BY MOUTH ONE TIME DAILY 90 tablet 1  . aspirin 81 MG tablet Take 81 mg by mouth daily.     Marland Kitchen atorvastatin (LIPITOR) 80 MG tablet Take 1 80 mg tablet daily 100 tablet 1  . cholecalciferol (VITAMIN D) 1000 units tablet Take 1,000 Units by mouth daily.    Marland Kitchen COVID-19 mRNA vaccine, Moderna, 100 MCG/0.5ML injection INJECT AS DIRECTED .25 mL 0  . doxycycline (VIBRAMYCIN) 100 MG capsule Take 1 capsule (100 mg total) by mouth 2 (two) times daily. 20 capsule 0  . furosemide (LASIX) 40 MG tablet TAKE ONE TABLET BY MOUTH ONE TIME DAILY 90 tablet 1  . hydrALAZINE (APRESOLINE) 25 MG tablet Take 1 tablet (25 mg total) by mouth 3 (three) times daily. 270 tablet 1  . metoprolol succinate (TOPROL-XL) 100 MG 24 hr tablet TAKE ONE TABLET BY MOUTH ONE TIME DAILY . TAKE WITH OR IMMEDIATELY FOLLOWING A MEAL 90 tablet 1  . potassium chloride SA (KLOR-CON) 20 MEQ tablet Take 0.5 tablets (10 mEq total) by mouth daily. 45 tablet 1   No current facility-administered medications for this visit.    Allergies:   Lisinopril    Social History:  The patient  reports that she has never smoked. She has never used smokeless tobacco. She reports that she does not drink alcohol and does not use drugs.   Family History:  The patient's family history includes Diabetes in her brother and mother.    ROS:  Please see the history of present illness.   Otherwise, review of systems are positive for none.   All other  systems are reviewed and negative.    PHYSICAL EXAM: VS:  BP 124/62   Pulse (!) 54   Ht '5\' 6"'$  (1.676 m)   Wt 178 lb 6.4 oz (80.9 kg)   SpO2 94%   BMI 28.79 kg/m  , BMI Body mass index is 28.79 kg/m. GENERAL:  Well appearing HEENT:  Pupils equal round and reactive, fundi not visualized, oral mucosa unremarkable NECK:  No jugular venous distention, waveform within normal limits, carotid upstroke brisk and symmetric, no bruits, no thyromegaly LYMPHATICS:  No cervical, inguinal adenopathy LUNGS:  Clear to auscultation bilaterally BACK:  No CVA tenderness CHEST:  Unremarkable HEART:  PMI not displaced or  sustained,S1 and S2 within normal limits, no S3, no S4, no clicks, no rubs, very soft apical systolic murmur nonradiating, no diastolic murmurs ABD:  Flat, positive bowel sounds normal in frequency in pitch, no bruits, no rebound, no guarding, no midline pulsatile mass, no hepatomegaly, no splenomegaly EXT:  2 plus pulses throughout, no edema, no cyanosis no clubbing SKIN:  No rashes no nodules NEURO:  Cranial nerves II through XII grossly intact, motor grossly intact throughout PSYCH:  Cognitively intact, oriented to person place and time    EKG:  EKG is ordered today. The ekg ordered today demonstrates normal sinus rhythm, rate 54, no acute ST-T wave changes.   Recent Labs: 10/10/2020: ALT 10 02/10/2021: BUN 16; Creatinine, Ser 1.45; Potassium 5.0; Sodium 140    Lipid Panel    Component Value Date/Time   CHOL 198 10/10/2020 1521   TRIG 64 10/10/2020 1521   HDL 79 10/10/2020 1521   CHOLHDL 2.5 10/10/2020 1521   VLDL 16.4 07/10/2019 1529   LDLCALC 104 (H) 10/10/2020 1521      Wt Readings from Last 3 Encounters:  04/16/21 178 lb 6.4 oz (80.9 kg)  04/08/21 176 lb (79.8 kg)  03/10/21 183 lb (83 kg)      Other studies Reviewed: Additional studies/ records that were reviewed today include: Primary care records. Review of the above records demonstrates:  Please see elsewhere in the note.     ASSESSMENT AND PLAN:  HTN:    Blood pressure is well controlled today.  I think she is on a very reasonable regimen.  I have asked him to keep a 2-week blood pressure diary with readings every other day.  For now I will leave her on the meds as listed.  DM: A1c is 5.5.  No change in therapy.  DYSLIPIDEMIA: I think the LDL is reasonable at 104.  No change in therapy.  CKD II: Patient's creatinine is mildly elevated.  We talked about this.  No change in therapy.   Current medicines are reviewed at length with the patient today.  The patient does not have concerns regarding  medicines.  The following changes have been made:  no change  Labs/ tests ordered today include: None  Orders Placed This Encounter  Procedures  . EKG 12-Lead     Disposition:   FU with me as needed.      Signed, Minus Breeding, MD  04/16/2021 6:12 PM    [facility]

## 2021-04-16 ENCOUNTER — Other Ambulatory Visit: Payer: Self-pay

## 2021-04-16 ENCOUNTER — Ambulatory Visit (INDEPENDENT_AMBULATORY_CARE_PROVIDER_SITE_OTHER): Payer: Medicare HMO | Admitting: Cardiology

## 2021-04-16 ENCOUNTER — Encounter: Payer: Self-pay | Admitting: Cardiology

## 2021-04-16 VITALS — BP 124/62 | HR 54 | Ht 66.0 in | Wt 178.4 lb

## 2021-04-16 DIAGNOSIS — E785 Hyperlipidemia, unspecified: Secondary | ICD-10-CM | POA: Diagnosis not present

## 2021-04-16 DIAGNOSIS — I10 Essential (primary) hypertension: Secondary | ICD-10-CM | POA: Diagnosis not present

## 2021-04-16 DIAGNOSIS — E118 Type 2 diabetes mellitus with unspecified complications: Secondary | ICD-10-CM

## 2021-04-16 NOTE — Patient Instructions (Signed)
Medication Instructions:  Continue current medications  *If you need a refill on your cardiac medications before your next appointment, please call your pharmacy*   Lab Work: None Ordered   Testing/Procedures: None Ordered   Follow-Up: At [facility], you and your health needs are our priority.  As part of our continuing mission to provide you with exceptional heart care, we have created designated Provider Care Teams.  These Care Teams include your primary Cardiologist (physician) and Advanced Practice Providers (APPs -  Physician Assistants and Nurse Practitioners) who all work together to provide you with the care you need, when you need it.  We recommend signing up for the patient portal called "MyChart".  Sign up information is provided on this After Visit Summary.  MyChart is used to connect with patients for Virtual Visits (Telemedicine).  Patients are able to view lab/test results, encounter notes, upcoming appointments, etc.  Non-urgent messages can be sent to your provider as well.   To learn more about what you can do with MyChart, go to [email or web address].    Your next appointment:   As Needed

## 2021-04-26 ENCOUNTER — Other Ambulatory Visit: Payer: Self-pay | Admitting: Family

## 2021-05-12 ENCOUNTER — Ambulatory Visit: Payer: Medicare HMO | Admitting: Neurology

## 2021-05-19 ENCOUNTER — Other Ambulatory Visit: Payer: Self-pay | Admitting: Family

## 2021-06-10 ENCOUNTER — Other Ambulatory Visit: Payer: Self-pay

## 2021-06-10 ENCOUNTER — Ambulatory Visit (INDEPENDENT_AMBULATORY_CARE_PROVIDER_SITE_OTHER): Payer: Medicare HMO | Admitting: Family

## 2021-06-10 ENCOUNTER — Encounter: Payer: Self-pay | Admitting: Family

## 2021-06-10 ENCOUNTER — Other Ambulatory Visit (HOSPITAL_BASED_OUTPATIENT_CLINIC_OR_DEPARTMENT_OTHER): Payer: Self-pay

## 2021-06-10 ENCOUNTER — Ambulatory Visit: Payer: Medicare HMO | Attending: Internal Medicine

## 2021-06-10 VITALS — BP 144/84 | HR 53 | Temp 98.5°F | Resp 16 | Ht 66.0 in | Wt 175.0 lb

## 2021-06-10 DIAGNOSIS — I10 Essential (primary) hypertension: Secondary | ICD-10-CM

## 2021-06-10 DIAGNOSIS — F0390 Unspecified dementia without behavioral disturbance: Secondary | ICD-10-CM

## 2021-06-10 DIAGNOSIS — Z23 Encounter for immunization: Secondary | ICD-10-CM

## 2021-06-10 DIAGNOSIS — R69 Illness, unspecified: Secondary | ICD-10-CM | POA: Diagnosis not present

## 2021-06-10 DIAGNOSIS — E118 Type 2 diabetes mellitus with unspecified complications: Secondary | ICD-10-CM

## 2021-06-10 DIAGNOSIS — E785 Hyperlipidemia, unspecified: Secondary | ICD-10-CM | POA: Diagnosis not present

## 2021-06-10 MED ORDER — SHINGRIX 50 MCG/0.5ML IM SUSR: INTRAMUSCULAR | 1 refills | Status: DC

## 2021-06-10 MED ORDER — SHINGRIX 50 MCG/0.5ML IM SUSR
INTRAMUSCULAR | 1 refills | Status: DC
  Filled 2021-06-10: qty 1, 1d supply, fill #0

## 2021-06-10 NOTE — Assessment & Plan Note (Signed)
Clinically stable on DM diet.  Obtain follow up A1c.

## 2021-06-10 NOTE — Progress Notes (Signed)
Subjective:   By signing my name below, I, Shehryar Baig, attest that this documentation has been prepared under the direction and in the presence of [name] NP. 06/10/2021    Patient ID: Sherry Perez, female    DOB: [birth year], 80 y.o.   MRN: [id number]  Chief Complaint  Patient presents with   Follow-up    HPI Patient is in today for a office visit.  Hyperlipidemia- Her cholesterol levels were doing well on her last measurements. She continues taking 80 mg atorvastatin daily PO.  Lab Results  Component Value Date   CHOL 198 10/10/2020   HDL 79 10/10/2020   LDLCALC 104 (H) 10/10/2020   TRIG 64 10/10/2020   CHOLHDL 2.5 10/10/2020   Hypertension- Her blood pressure is doing well this visit. She continues taking 40 mg lasix daily PO, 100 mg metoprolol succinate daily PO, 25 mg hydralazine daily PO, and 10 mg amlodipine daily PO. BP Readings from Last 3 Encounters:  06/10/21 (!) 144/84  04/16/21 124/62  04/08/21 (!) 142/82   Immunizations- She has 3 Covid-19 vaccines at this time. She is due for the shingles vaccine and is interested in get it at her pharmacy.  Health Maintenance Due  Topic Date Due   Zoster Vaccines- Shingrix (1 of 2) Never done   OPHTHALMOLOGY EXAM  02/17/2017   HEMOGLOBIN A1C  04/10/2021   COVID-19 Vaccine (4 - Booster) 04/12/2021    Past Medical History:  Diagnosis Date   Diabetes mellitus without complication (HCC)    HTN (hypertension)    Hyperlipidemia     Past Surgical History:  Procedure Laterality Date   ABDOMINAL HYSTERECTOMY     cataract surgery Left 02/04/2021    Family History  Problem Relation Age of Onset   Diabetes Mother    Diabetes Brother     Social History   Socioeconomic History   Marital status: Widowed    Spouse name: Not on file   Number of children: Not on file   Years of education: Not on file   Highest education level: Not on file  Occupational History   Not on file  Tobacco Use   Smoking  status: Never   Smokeless tobacco: Never  Vaping Use   Vaping Use: Never used  Substance and Sexual Activity   Alcohol use: No    Alcohol/week: 0.0 standard drinks   Drug use: Never   Sexual activity: Not on file  Other Topics Concern   Not on file  Social History Narrative   4 children (3 daughters 1 son)- all local   7 grandchildren    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 30 tablet 0   amLODipine (NORVASC) 10 MG tablet TAKE ONE TABLET BY MOUTH ONE TIME DAILY 90 tablet 1   aspirin 81 MG tablet Take 81 mg by mouth daily.     atorvastatin (LIPITOR)  80 MG tablet Take 1 tablet (80 mg total) by mouth daily. 90 tablet 1   cholecalciferol (VITAMIN D) 1000 units tablet Take 1,000 Units by mouth daily.     furosemide (LASIX) 40 MG tablet TAKE ONE TABLET BY MOUTH ONE TIME DAILY 90 tablet 1   hydrALAZINE (APRESOLINE) 25 MG tablet Take 1 tablet (25 mg total) by mouth 3 (three) times daily. 270 tablet 1   metoprolol succinate (TOPROL-XL) 100 MG 24 hr tablet TAKE ONE TABLET BY MOUTH ONE TIME DAILY TAKE WITH OR IMMEDIATELY FOLLOWING A MEAL 90 tablet 1   potassium chloride SA (KLOR-CON) 20 MEQ tablet Take 0.5 tablets (10 mEq total) by mouth daily. 45 tablet 1   COVID-19 mRNA vaccine, Moderna, 100 MCG/0.5ML injection INJECT AS DIRECTED .25 mL 0   doxycycline (VIBRAMYCIN) 100 MG capsule Take 1 capsule (100 mg total) by mouth 2 (two) times  daily. (Patient not taking: Reported on 06/10/2021) 20 capsule 0   No facility-administered medications prior to visit.    Allergies  Allergen Reactions   Lisinopril     Renal insufficiency    ROS    See HPI Objective:    Physical Exam Constitutional:      General: She is not in acute distress.    Appearance: Normal appearance. She is not ill-appearing.  HENT:     Head: Normocephalic and atraumatic.     Right Ear: External ear normal.     Left Ear: External ear normal.  Eyes:     Extraocular Movements: Extraocular movements intact.     Pupils: Pupils are equal, round, and reactive to light.  Cardiovascular:     Rate and Rhythm: Normal rate and regular rhythm.     Pulses: Normal pulses.     Heart sounds: Normal heart sounds. No murmur heard.   No gallop.  Pulmonary:     Effort: Pulmonary effort is normal. No respiratory distress.     Breath sounds: Normal breath sounds. No wheezing, rhonchi or rales.  Skin:    General: Skin is warm and dry.  Neurological:     Mental Status: She is alert and oriented to person, place, and time.  Psychiatric:        Behavior: Behavior normal.    BP (!) 144/84 (BP Location: Left Arm, Patient Position: Sitting, Cuff Size: Small)   Pulse (!) 53   Temp 98.5 F (36.9 C) (Oral)   Resp 16   Ht 5' 6"  (1.676 m)   Wt 175 lb (79.4 kg)   SpO2 96%   BMI 28.25 kg/m  Wt Readings from Last 3 Encounters:  06/10/21 175 lb (79.4 kg)  04/16/21 178 lb 6.4 oz (80.9 kg)  04/08/21 176 lb (79.8 kg)       Assessment & Plan:   Problem List Items Addressed This Visit       Unprioritized   Hyperlipidemia - Primary    Tolerating atorvastatin 7m. Continue same. Obtain follow up lipid panel.        Relevant Orders   Comp Met (CMET)   Lipid panel   HTN (hypertension)    BP looks fantastic for her today. Continue amlodipine 117m hydralazine 2575mnd toprol 100m68m      Diabetes type 2, controlled (HCC)[facility] stable on DM diet.   Obtain follow up A1c.        Relevant Orders   Hemoglobin A1c   Dementia (HCC)    Stable, she lives with daughter and  daughter helps her with meds/adls.          Meds ordered this encounter  Medications   DISCONTD: Zoster Vaccine Adjuvanted [facility]) injection    Sig: 0.96m IM then repeat in 2-6 months    Dispense:  0.5 mL    Refill:  1    Order Specific Question:   Supervising Provider    Answer:   BPenni HomansA [[id number]]   Zoster Vaccine Adjuvanted ([facility] injection    Sig: 0.556mIM then repeat in 2-6 months    Dispense:  0.5 mL    Refill:  1    Order Specific Question:   Supervising Provider    Answer:   BLPenni Homans [[id number]]    I, MeDebbrah AlarP, personally preformed the services described in this documentation.  All medical record entries made by the scribe were at my direction and in my presence.  I have reviewed the chart and discharge instructions (if applicable) and agree that the record reflects my personal performance and is accurate and complete. 06/10/2021   I,Shehryar Baig,acting as a scEducation administratoror MeNance PearNP.,have documented all relevant documentation on the behalf of MeNance PearNP,as directed by  MeNance PearNP while in the presence of MeNance PearNP.   MeNance PearNP

## 2021-06-10 NOTE — Progress Notes (Signed)
   Covid-19 Vaccination Clinic  Name:  Sherry Perez    MRN: [id number] DOB: Jun 04, 1941  06/10/2021  Sherry Perez was observed post Covid-19 immunization for 15 minutes without incident. She was provided with Vaccine Information Sheet and instruction to access the V-Safe system.   Sherry Perez was instructed to call 911 with any severe reactions post vaccine: Difficulty breathing  Swelling of face and throat  A fast heartbeat  A bad rash all over body  Dizziness and weakness   Immunizations Administered     Name Date Dose VIS Date Route   PFIZER Comrnaty(Gray TOP) Covid-19 Vaccine 06/10/2021  3:40 PM 0.3 mL 11/27/2020 Intramuscular   Manufacturer: [place]   Lot: [id number]   [place]: [phone]

## 2021-06-10 NOTE — Patient Instructions (Addendum)
Please complete lab work prior to leaving.  Please get your second covid booster (4th shot) downstairs at the pharmacy.

## 2021-06-10 NOTE — Assessment & Plan Note (Signed)
Stable, she lives with daughter and daughter helps her with meds/adls.

## 2021-06-10 NOTE — Assessment & Plan Note (Signed)
BP looks fantastic for her today. Continue amlodipine '10mg'$ , hydralazine '25mg'$  and toprol '100mg'$ .

## 2021-06-10 NOTE — Assessment & Plan Note (Signed)
Tolerating atorvastatin '80mg'$ . Continue same. Obtain follow up lipid panel.

## 2021-06-11 ENCOUNTER — Other Ambulatory Visit (HOSPITAL_BASED_OUTPATIENT_CLINIC_OR_DEPARTMENT_OTHER): Payer: Self-pay

## 2021-06-11 MED ORDER — COVID-19 MRNA VAC-TRIS(PFIZER) 30 MCG/0.3ML IM SUSP
INTRAMUSCULAR | 0 refills | Status: DC
  Filled 2021-06-11: qty 0.3, 1d supply, fill #0

## 2021-06-11 NOTE — Addendum Note (Signed)
Addended by: Jiles Prows on: 06/11/2021 10:35 AM   Modules accepted: Orders

## 2021-06-12 ENCOUNTER — Telehealth: Payer: Self-pay | Admitting: Family

## 2021-06-12 ENCOUNTER — Other Ambulatory Visit (INDEPENDENT_AMBULATORY_CARE_PROVIDER_SITE_OTHER): Payer: Medicare HMO

## 2021-06-12 ENCOUNTER — Ambulatory Visit: Payer: Medicare HMO | Admitting: Physician Assistant

## 2021-06-12 ENCOUNTER — Other Ambulatory Visit: Payer: Self-pay

## 2021-06-12 ENCOUNTER — Telehealth: Payer: Self-pay

## 2021-06-12 ENCOUNTER — Encounter: Payer: Self-pay | Admitting: Physician Assistant

## 2021-06-12 VITALS — BP 118/74 | HR 59 | Ht 65.0 in | Wt 173.4 lb

## 2021-06-12 DIAGNOSIS — R413 Other amnesia: Secondary | ICD-10-CM | POA: Diagnosis not present

## 2021-06-12 DIAGNOSIS — I10 Essential (primary) hypertension: Secondary | ICD-10-CM

## 2021-06-12 LAB — TSH: TSH: 3.75 u[IU]/mL (ref 0.35–4.50)

## 2021-06-12 LAB — VITAMIN B12: Vitamin B-12: 293 pg/mL (ref 211–911)

## 2021-06-12 MED ORDER — DONEPEZIL HCL 10 MG PO TABS: ORAL_TABLET | ORAL | 11 refills | Status: DC

## 2021-06-12 NOTE — Telephone Encounter (Signed)
Spoke with pt daughter pt needs to start over the counter vitamin B12 1000 mcg daily to maintain B12 above 400.  THyroid levels: Normal

## 2021-06-12 NOTE — Telephone Encounter (Signed)
Received call from [facility] team that they discovered that Renal Artery duplex ordered on 3/22 was placed for the [place] location which was listed as a option in Epic, but is not actually doing these studies.  Will replace order for another location.

## 2021-06-12 NOTE — Patient Instructions (Addendum)
It was a pleasure to see you today at our office.   Recommendations:  Follow up in 6 month Check B12 and TSH at the lab We will start donepezil half tablet ('5mg'$ ) daily for 2  weeks.  If you are tolerating the medication, then after 2 weeks, we will increase the dose to a full tablet of '10mg'$  daily.  Side effects include nausea, vomiting, diarrhea, vivid dreams, and muscle cramps.  Please call the clinic if you experience any of these symptoms.   RECOMMENDATIONS FOR ALL PATIENTS WITH MEMORY PROBLEMS: 1. Continue to exercise (Recommend 30 minutes of walking everyday, or 3 hours every week) 2. Increase social interactions - continue going to [place] and enjoy social gatherings with friends and family 3. Eat healthy, avoid fried foods and eat more fruits and vegetables 4. Maintain adequate blood pressure, blood sugar, and blood cholesterol level. Reducing the risk of stroke and cardiovascular disease also helps promoting better memory. 5. Avoid stressful situations. Live a simple life and avoid aggravations. Organize your time and prepare for the next day in anticipation. 6. Sleep well, avoid any interruptions of sleep and avoid any distractions in the bedroom that may interfere with adequate sleep quality 7. Avoid sugar, avoid sweets as there is a strong link between excessive sugar intake, diabetes, and cognitive impairment We discussed the Mediterranean diet, which has been shown to help patients reduce the risk of progressive memory disorders and reduces cardiovascular risk. This includes eating fish, eat fruits and green leafy vegetables, nuts like almonds and hazelnuts, walnuts, and also use olive oil. Avoid fast foods and fried foods as much as possible. Avoid sweets and sugar as sugar use has been linked to worsening of memory function.  There is always a concern of gradual progression of memory problems. If this is the case, then we may need to adjust level of care according to patient needs.  Support, both to the patient and caregiver, should then be put into place.         FALL PRECAUTIONS: Be cautious when walking. Scan the area for obstacles that may increase the risk of trips and falls. When getting up in the mornings, sit up at the edge of the bed for a few minutes before getting out of bed. Consider elevating the bed at the head end to avoid drop of blood pressure when getting up. Walk always in a well-lit room (use night lights in the walls). Avoid area rugs or power cords from appliances in the middle of the walkways. Use a walker or a cane if necessary and consider physical therapy for balance exercise. Get your eyesight checked regularly.   HOME SAFETY: Consider the safety of the kitchen when operating appliances like stoves, microwave oven, and blender. Consider having supervision and share cooking responsibilities until no longer able to participate in those. Accidents with firearms and other hazards in the house should be identified and addressed as well.   ABILITY TO BE LEFT ALONE: If patient is unable to contact 911 operator, consider using LifeLine, or when the need is there, arrange for someone to stay with patients. Smoking is a fire hazard, consider supervision or cessation. Risk of wandering should be assessed by caregiver and if detected at any point, supervision and safe proof recommendations should be instituted.  MEDICATION SUPERVISION: Inability to self-administer medication needs to be constantly addressed. Implement a mechanism to ensure safe administration of the medications.      Mediterranean Diet A Mediterranean diet refers to food  and lifestyle choices that are based on the traditions of countries located on the [employer]. This way of eating has been shown to help prevent certain conditions and improve outcomes for people who have chronic diseases, like kidney disease and heart disease. What are tips for following this plan? Lifestyle  Cook  and eat meals together with your family, when possible. Drink enough fluid to keep your urine clear or pale yellow. Be physically active every day. This includes: Aerobic exercise like running or swimming. Leisure activities like gardening, walking, or housework. Get 7-8 hours of sleep each night. If recommended by your health care provider, drink red wine in moderation. This means 1 glass a day for nonpregnant women and 2 glasses a day for men. A glass of wine equals 5 oz (150 mL). Reading food labels  Check the serving size of packaged foods. For foods such as rice and pasta, the serving size refers to the amount of cooked product, not dry. Check the total fat in packaged foods. Avoid foods that have saturated fat or trans fats. Check the ingredients list for added sugars, such as corn syrup. Shopping  At the grocery store, buy most of your food from the areas near the walls of the store. This includes: Fresh fruits and vegetables (produce). Grains, beans, nuts, and seeds. Some of these may be available in unpackaged forms or large amounts (in bulk). Fresh seafood. Poultry and eggs. Low-fat dairy products. Buy whole ingredients instead of prepackaged foods. Buy fresh fruits and vegetables in-season from local farmers markets. Buy frozen fruits and vegetables in resealable bags. If you do not have access to quality fresh seafood, buy precooked frozen shrimp or canned fish, such as tuna, salmon, or sardines. Buy small amounts of raw or cooked vegetables, salads, or olives from the deli or salad bar at your store. Stock your pantry so you always have certain foods on hand, such as olive oil, canned tuna, canned tomatoes, rice, pasta, and beans. Cooking  Cook foods with extra-virgin olive oil instead of using butter or other vegetable oils. Have meat as a side dish, and have vegetables or grains as your main dish. This means having meat in small portions or adding small amounts of meat to  foods like pasta or stew. Use beans or vegetables instead of meat in common dishes like chili or lasagna. Experiment with different cooking methods. Try roasting or broiling vegetables instead of steaming or sauteing them. Add frozen vegetables to soups, stews, pasta, or rice. Add nuts or seeds for added healthy fat at each meal. You can add these to yogurt, salads, or vegetable dishes. Marinate fish or vegetables using olive oil, lemon juice, garlic, and fresh herbs. Meal planning  Plan to eat 1 vegetarian meal one day each week. Try to work up to 2 vegetarian meals, if possible. Eat seafood 2 or more times a week. Have healthy snacks readily available, such as: Vegetable sticks with hummus. Greek yogurt. Fruit and nut trail mix. Eat balanced meals throughout the week. This includes: Fruit: 2-3 servings a day Vegetables: 4-5 servings a day Low-fat dairy: 2 servings a day Fish, poultry, or lean meat: 1 serving a day Beans and legumes: 2 or more servings a week Nuts and seeds: 1-2 servings a day Whole grains: 6-8 servings a day Extra-virgin olive oil: 3-4 servings a day Limit red meat and sweets to only a few servings a month What are my food choices? Mediterranean diet Recommended Grains: Whole-grain pasta. Brown rice.  [place] wheat. Polenta. Couscous. Whole-wheat bread. [place]. Vegetables: Artichokes. Beets. Broccoli. Cabbage. Carrots. Eggplant. Green beans. Chard. Kale. Spinach. Onions. Leeks. Peas. Squash. Tomatoes. Peppers. Radishes. Fruits: Apples. Apricots. Avocado. Berries. Bananas. Cherries. Dates. Figs. Grapes. Lemons. Melon. Oranges. Peaches. Plums. Pomegranate. Meats and other protein foods: Beans. Almonds. Sunflower seeds. Pine nuts. Peanuts. [place]. Salmon. Scallops. Shrimp. [place]. Tilapia. Clams. Oysters. Eggs. Dairy: Low-fat milk. Cheese. Greek yogurt. Beverages: Water. Red wine. Herbal tea. Fats and oils: Extra virgin olive oil. Avocado oil. Grape seed  oil. Sweets and desserts: [place] yogurt with honey. Baked apples. Poached pears. Trail mix. Seasoning and other foods: Basil. Cilantro. Coriander. Cumin. Mint. Parsley. Sage. Rosemary. Tarragon. Garlic. Oregano. Thyme. Pepper. Balsalmic vinegar. Tahini. Hummus. Tomato sauce. Olives. Mushrooms. Limit these Grains: Prepackaged pasta or rice dishes. Prepackaged cereal with added sugar. Vegetables: Deep fried potatoes (french fries). Fruits: Fruit canned in syrup. Meats and other protein foods: Beef. Pork. Lamb. Poultry with skin. Hot dogs. [place] Salines. Dairy: Ice cream. Sour cream. Whole milk. Beverages: Juice. Sugar-sweetened soft drinks. Beer. Liquor and spirits. Fats and oils: Butter. Canola oil. Vegetable oil. Beef fat (tallow). Lard. Sweets and desserts: Cookies. Cakes. Pies. Candy. Seasoning and other foods: Mayonnaise. Premade sauces and marinades. The items listed may not be a complete list. Talk with your dietitian about what dietary choices are right for you. Summary The Mediterranean diet includes both food and lifestyle choices. Eat a variety of fresh fruits and vegetables, beans, nuts, seeds, and whole grains. Limit the amount of red meat and sweets that you eat. Talk with your health care provider about whether it is safe for you to drink red wine in moderation. This means 1 glass a day for nonpregnant women and 2 glasses a day for men. A glass of wine equals 5 oz (150 mL). This information is not intended to replace advice given to you by your health care provider. Make sure you discuss any questions you have with your health care provider. Document Released: 07/29/2016 Document Revised: 08/31/2016 Document Reviewed: 07/29/2016 Elsevier Interactive Patient Education  2017 [employer].

## 2021-06-12 NOTE — Progress Notes (Addendum)
Assessment/Plan:   Sherry Perez is a 80 y.o. year old female with risk factors including hypertension, hyperlipidemia, CAD, anxiety, depression and  seen today for evaluation of memory loss.  MoCA today is 6/30 with deficiencies in visuospatial, naming, attention, language, abstraction, and delayed recall, as well as orientation, suspicious for dementia.  She had a prior CT of the head on 02/25/2028, which showed mild cerebral volume loss, but no acute intracranial findings.   Recommendations are as follows   Dementia with behavioral disturbance, likely due to Alzheimer's disease  Check B12, TSH Discussed safety both in and out of the home.  Discussed the importance of regular daily schedule  Continue to monitor mood with PCP.  Stay active at least 30 minutes at least 3 times a week.  Naps should be scheduled and should be no longer than 60 minutes and should not occur after 2 PM.  Mediterranean diet is recommended  We will start donepezil half tablet ('5mg'$ ) daily for 2  weeks.  If you are tolerating the medication, then after 2 weeks, we will increase the dose to a full tablet of '10mg'$  daily.  Side effects include nausea, vomiting, diarrhea, vivid dreams, and muscle cramps.   Folllow up in 6 months   Subjective:    The patient is seen in neurologic consultation at the request of [name], NP for the evaluation of memory.  The patient is accompanied by daughter Sherry Perez who supplements the history. The patient is a 80 y.o. year old female who has had memory issues for about 3 years when she began to see "people in the porch, hiding in the trees ", but daughter reports that over the last 6 months, they are now "men hiding  in the trees, and they are naked".  No other hallucinations, paranoia or irritability.       Her long-term memory is fair,  and her short term memory is deficient, "but she remains herself jolly ".  At home, she does simple activities of daily living, which  include making the bed, taking the trash, and bringing the mail home "she is still around a lot"- daughter says.  She denies leaving objects in unusual places.  She denies any vivid dreams, or sleepwalking.  She sleeps all night, naps 1 or 2 times a day.  Her appetite is good, and denies trouble swallowing.  She cooks simple recipes, and at times, she forgets to leave the stove off.  She is independent of bathing and dressing.  She states the medications herself, missing some doses, but the daughter tries to "stay on top of it".  She does not do the finances, daughter pays the bills. She ambulates without difficulty, without a use of walker or cane.  She no longer drives for at least 4 years.  She denies having gotten lost in her car. Denies headaches, trauma, or injuries to the head, double vision, dizziness, focal numbness or tingling, unilateral weakness or tremors.  She is incontinent of urine and wears pads or diapers, but denies constipation or diarrhea. Denies history of OSA, ETOH  Tobacco. Family History negative for dementia.  Family lives in [place], has 4 children.  She is widowed   Allergies  Allergen Reactions   Lisinopril     Renal insufficiency    Current Outpatient Medications  Medication Instructions   acetaminophen (TYLENOL) 1,000 mg, Oral, 2 times daily   amLODipine (NORVASC) 10 MG tablet TAKE ONE TABLET BY MOUTH ONE TIME DAILY  aspirin 81 mg, Oral, Daily   atorvastatin (LIPITOR) 80 mg, Oral, Daily   cholecalciferol (VITAMIN D) 1,000 Units, Oral, Daily   donepezil (ARICEPT) 10 MG tablet Take half tablet (5 mg) daily for 2 weeks, then increase to the full tablet at 10 mg daily   furosemide (LASIX) 40 MG tablet TAKE ONE TABLET BY MOUTH ONE TIME DAILY   hydrALAZINE (APRESOLINE) 25 mg, Oral, 3 times daily   metoprolol succinate (TOPROL-XL) 100 MG 24 hr tablet TAKE ONE TABLET BY MOUTH ONE TIME DAILY TAKE WITH OR IMMEDIATELY FOLLOWING A MEAL   potassium chloride SA (KLOR-CON)  20 MEQ tablet 10 mEq, Oral, Daily     VITALS:   Vitals:   06/12/21 0859  BP: 118/74  Pulse: (!) 59  SpO2: 98%  Weight: 173 lb 6.4 oz (78.7 kg)  Height: '5\' 5"'$  (1.651 m)     HEENT:  Normocephalic, atraumatic. The mucous membranes are moist. The superficial temporal arteries are without ropiness or tenderness. Cardiovascular: Regular rate and rhythm. Lungs: Clear to auscultation bilaterally. Neck: There are no carotid bruits noted bilaterally.  NEUROLOGICAL:  Orientation:   Montreal Cognitive Assessment  06/12/2021  Visuospatial/ Executive (0/5) 1  Naming (0/3) 0  Attention: Read list of digits (0/2) 1  Attention: Read list of letters (0/1) 0  Attention: Serial 7 subtraction starting at 100 (0/3) 0  Language: Repeat phrase (0/2) 1  Language : Fluency (0/1) 0  Abstraction (0/2) 0  Delayed Recall (0/5) 0  Orientation (0/6) 2  Total 5  Adjusted Score (based on education) 6   Alert and oriented to person, not to  place or time. Year is 1922, Wednesday. No aphasia or dysarthria. Fund of knowledge is reduced. Recent and remote memory are impaired.  Attention and concentration are reduced .  Unable to name objects and repeat phrases. Cranial nerves: There is good facial symmetry. Extraocular muscles are intact and visual fields are full to confrontational testing. Speech is not fluent and clear. Soft palate rises symmetrically and there is no tongue deviation. Hearing is intact to conversational tone. Tone: Tone is good throughout. Sensation: Sensation is intact to light touch and pinprick throughout. Vibration is intact at the bilateral big toe.There is no extinction with double simultaneous stimulation. There is no sensory dermatomal level identified. Coordination: The patient has no difficulty with RAM's or FNF bilaterally. Normal finger to nose  Motor: Strength is 5/5 in the bilateral upper and lower extremities. There is no pronator drift. There are no fasciculations  noted. DTR's: Deep tendon reflexes are 2/4 at the bilateral biceps, triceps, brachioradialis, patella and achilles.  Plantar responses are downgoing bilaterally. Gait and Station: The patient is able to ambulate with some  difficulty.  The patient is able to ambulate in a tandem fashion. The patient is able to stand in the Romberg position.   CBC Latest Ref Rng & Units 01/27/2018  WBC 3.8 - 10.8 Thousand/uL 7.9  Hemoglobin 11.7 - 15.5 g/dL 12.4  Hematocrit 35.0 - 45.0 % 36.8  Platelets 140 - 400 Thousand/uL 310     CMP Latest Ref Rng & Units 02/10/2021 10/10/2020 06/18/2020  Glucose 70 - 99 mg/dL 82 83 87  BUN 6 - 23 mg/dL 16 30(H) 20  Creatinine 0.40 - 1.20 mg/dL 1.45(H) 2.24(H) 1.52(H)  Sodium 135 - 145 mEq/L 140 135 138  Potassium 3.5 - 5.1 mEq/L 5.0 4.6 5.0  Chloride 96 - 112 mEq/L 103 99 103  CO2 19 - 32 mEq/L 27 23 28  Calcium 8.4 - 10.5 mg/dL 9.7 8.9 9.3  Total Protein 6.1 - 8.1 g/dL - 7.3 -  Total Bilirubin 0.2 - 1.2 mg/dL - 0.4 -  Alkaline Phos 39 - 117 U/L - - -  AST 10 - 35 U/L - 17 -  ALT 6 - 29 U/L - 10 -      Thank you for allowing [place] the opportunity to participate in the care of this nice patient. Please do not hesitate to contact us for any questions or concerns.   Total time spent on today's visit was  60 minutes, including both face-to-face time and nonface-to-face time.  Time included that spent on review of records (prior notes available to me/labs/imaging if pertinent), discussing treatment and goals, answering patient's questions and coordinating care.  Cc:  [name], NP  Sharene Butters 06/12/2021 1:02 PM

## 2021-06-12 NOTE — Telephone Encounter (Signed)
-----   Message from Rondel Jumbo, PA-C sent at 06/12/2021  2:04 PM EDT ----- Patient has B12 of 293, she needs to replenish with over the counter vitamin B12 1000 mcg daily to maintain B12 above 400.  THyroid levels: Normal TSH at 3.75  Follow up with PCP,thanks

## 2021-06-14 ENCOUNTER — Encounter: Payer: Self-pay | Admitting: Physician Assistant

## 2021-06-16 ENCOUNTER — Encounter: Payer: Self-pay | Admitting: Physician Assistant

## 2021-06-18 ENCOUNTER — Other Ambulatory Visit: Payer: Self-pay

## 2021-06-18 ENCOUNTER — Ambulatory Visit (HOSPITAL_COMMUNITY)
Admission: RE | Admit: 2021-06-18 | Discharge: 2021-06-18 | Disposition: A | Discharge status: Home / Self Care | Payer: Medicare HMO | Source: Ambulatory Visit | Attending: Family | Admitting: Family

## 2021-06-18 DIAGNOSIS — I10 Essential (primary) hypertension: Secondary | ICD-10-CM | POA: Diagnosis not present

## 2021-06-19 ENCOUNTER — Ambulatory Visit: Payer: Medicare HMO

## 2021-06-19 NOTE — Progress Notes (Deleted)
Subjective:   Sherry Perez is a 80 y.o. female who presents for Medicare Annual (Subsequent) preventive examination.   I connected with Sherry Perez today by telephone and verified that I am speaking with the correct person using two identifiers. Location patient: home Location provider: work Persons participating in the virtual visit: patient, Marine scientist.    I discussed the limitations, risks, security and privacy concerns of performing an evaluation and management service by telephone and the availability of in person appointments. I also discussed with the patient that there may be a patient responsible charge related to this service. The patient expressed understanding and verbally consented to this telephonic visit.    Interactive audio and video telecommunications were attempted between this provider and patient, however failed, due to patient having technical difficulties OR patient did not have access to video capability.  We continued and completed visit with audio only.  Some vital signs may be absent or patient reported.   Time Spent with patient on telephone encounter: *** minutes  Review of Systems    ***       Objective:    There were no vitals filed for this visit. There is no height or weight on file to calculate BMI.  Advanced Directives 06/12/2021 11/06/2019  Does Patient Have a Medical Advance Directive? No No  Would patient like information on creating a medical advance directive? - No - Patient declined    Current Medications (verified) Outpatient Encounter Medications as of 06/19/2021  Medication Sig   acetaminophen (TYLENOL) 500 MG tablet Take 2 tablets (1,000 mg total) by mouth 2 (two) times daily.   amLODipine (NORVASC) 10 MG tablet TAKE ONE TABLET BY MOUTH ONE TIME DAILY   aspirin 81 MG tablet Take 81 mg by mouth daily.   atorvastatin (LIPITOR) 80 MG tablet Take 1 tablet (80 mg total) by mouth daily.   cholecalciferol (VITAMIN D) 1000 units tablet Take 1,000  Units by mouth daily.   donepezil (ARICEPT) 10 MG tablet Take half tablet (5 mg) daily for 2 weeks, then increase to the full tablet at 10 mg daily   furosemide (LASIX) 40 MG tablet TAKE ONE TABLET BY MOUTH ONE TIME DAILY   hydrALAZINE (APRESOLINE) 25 MG tablet Take 1 tablet (25 mg total) by mouth 3 (three) times daily.   metoprolol succinate (TOPROL-XL) 100 MG 24 hr tablet TAKE ONE TABLET BY MOUTH ONE TIME DAILY TAKE WITH OR IMMEDIATELY FOLLOWING A MEAL   potassium chloride SA (KLOR-CON) 20 MEQ tablet Take 0.5 tablets (10 mEq total) by mouth daily.   No facility-administered encounter medications on file as of 06/19/2021.    Allergies (verified) Lisinopril   History: Past Medical History:  Diagnosis Date   Diabetes mellitus without complication (HCC)    HTN (hypertension)    Hyperlipidemia    Past Surgical History:  Procedure Laterality Date   ABDOMINAL HYSTERECTOMY     cataract surgery Left 02/04/2021   Family History  Problem Relation Age of Onset   Diabetes Mother    Diabetes Brother    Social History   Socioeconomic History   Marital status: Widowed    Spouse name: Not on file   Number of children: Not on file   Years of education: Not on file   Highest education level: Not on file  Occupational History   Not on file  Tobacco Use   Smoking status: Never   Smokeless tobacco: Never  Vaping Use   Vaping Use: Never used  Substance and  Sexual Activity   Alcohol use: No    Alcohol/week: 0.0 standard drinks   Drug use: Never   Sexual activity: Not on file  Other Topics Concern   Not on file  Social History Narrative   4 children (3 daughters 1 son)- all local   7 grandchildren    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  Yes  If diabetic, was a CBG obtained today?  No  Did the patient bring in their glucometer from home?  {YES/NO:21197} How often do you monitor your CBG's? ***.   Financial Strains and Diabetes Management:  Are you having any financial strains with the device, your supplies or your medication? {YES/NO:21197}.  Does the patient want to be seen by Chronic Care Management for management of their diabetes?  {YES/NO:21197} Would the patient like to be referred to a Nutritionist or for Diabetic Management?  {YES/NO:21197}  Diabetic Exams:  Diabetic Eye Exam: Completed ***. Overdue for diabetic eye exam. Pt has been advised about the importance in completing this exam. A referral has been placed today. Message sent to referral coordinator for scheduling purposes. Advised pt to expect a call from our office re: appt.  Diabetic Foot Exam: Completed 10/10/2020.           Activities of Daily Living In your present state of health, do you have any difficulty performing the following activities: 02/10/2021  Hearing? N  Vision? N  Difficulty concentrating or making decisions? Y  Walking or climbing stairs? N  Dressing or bathing? N  Doing errands, shopping? Y  Some recent data might be hidden    Patient Care Team: [name], NP as PCP - General (Internal Medicine) Corliss Parish, MD as Consulting Physician (Nephrology) Elease Hashimoto (Neurology)  Indicate any recent Medical Services you may have received from other than [facility] providers in the past year (date may be approximate).     Assessment:   This is a routine wellness examination for Sherry Perez.  Hearing/Vision screen No results found.  Dietary  issues and exercise activities discussed:     Goals Addressed   None    Depression Screen PHQ 2/9 Scores 02/10/2021 11/06/2019 02/24/2018 02/22/2017 10/25/2016 10/11/2016 09/13/2016  PHQ - 2 Score 0 0 1 1 0 0 0  PHQ- 9 Score - - 1 3 - - -    Fall Risk Fall Risk  06/12/2021 02/10/2021 11/06/2019 04/28/2018 02/22/2017  Falls in the past year? 0 0 0 Yes Yes  Number falls in past yr: 0 0 0 1 1  Injury with Fall? 0 0 0 No No  Follow up - - Education provided;Falls prevention discussed - -    FALL RISK PREVENTION PERTAINING TO THE HOME:  Any stairs in or around the home? {YES/NO:21197} If so, are there any without handrails? {YES/NO:21197} Home free of loose throw rugs in walkways, pet beds,  electrical cords, etc? {YES/NO:21197} Adequate lighting in your home to reduce risk of falls? {YES/NO:21197}  ASSISTIVE DEVICES UTILIZED TO PREVENT FALLS:  Life alert? {YES/NO:21197} Use of a cane, walker or w/c? {YES/NO:21197} Grab bars in the bathroom? {YES/NO:21197} Shower chair or bench in shower? {YES/NO:21197} Elevated toilet seat or a handicapped toilet? {YES/NO:21197}  TIMED UP AND GO:  Was the test performed? {YES/NO:21197}.  Length of time to ambulate 10 feet: *** sec.   {Appearance of YN:[id number]  Cognitive Function:Patient currently being treated by neurologist for dementia. MMSE - Mini Mental State Exam 11/06/2019 07/10/2019  Not completed: Refused -  Orientation to time - 3  Orientation to Place - 3  Registration - 3  Attention/ Calculation - 4  Recall - 0  Language- name 2 objects - 2  Language- repeat - 1  Language- follow 3 step command - 3  Language- read & follow direction - 1  Write a sentence - 1  Copy design - 1  Total score - 22   Montreal Cognitive Assessment  06/12/2021  Visuospatial/ Executive (0/5) 1  Naming (0/3) 0  Attention: Read list of digits (0/2) 1  Attention: Read list of letters (0/1) 0  Attention: Serial 7 subtraction starting at 100 (0/3) 0   Language: Repeat phrase (0/2) 1  Language : Fluency (0/1) 0  Abstraction (0/2) 0  Delayed Recall (0/5) 0  Orientation (0/6) 2  Total 5  Adjusted Score (based on education) 6      Immunizations Immunization History  Administered Date(s) Administered   Fluad Quad(high Dose 65+) 10/09/2019, 10/10/2020   Influenza Split 10/23/2012, 09/18/2014, 10/08/2015   Influenza, High Dose Seasonal PF 09/13/2016, 10/28/2017, 09/01/2018   Influenza-Unspecified 10/15/2009, 10/23/2010, 09/27/2011   Moderna SARS-COV2 Booster Vaccination 01/12/2021   PFIZER Comirnaty(Gray Top)Covid-19 Tri-Sucrose Vaccine 06/10/2021   PFIZER(Purple Top)SARS-COV-2 Vaccination 02/15/2020, 03/08/2020, 01/12/2021   Pneumococcal Conjugate-13 05/29/2018   Pneumococcal-Unspecified 09/27/2011   Zoster Recombinat (Shingrix) 06/10/2021    TDAP status: Up to date  Flu Vaccine status: Up to date  Pneumococcal vaccine status: Up to date  Covid-19 vaccine status: Completed vaccines  Qualifies for Shingles Vaccine? No   Zostavax completed No   Shingrix Completed?: Yes  Screening Tests Health Maintenance  Topic Date Due   OPHTHALMOLOGY EXAM  02/17/2017   HEMOGLOBIN A1C  04/10/2021   INFLUENZA VACCINE  07/20/2021   Zoster Vaccines- Shingrix (2 of 2) 08/05/2021   FOOT EXAM  10/10/2021   COVID-19 Vaccine (5 - Booster) 10/10/2021   TETANUS/TDAP  12/20/2022   DEXA SCAN  Completed   PNA vac Low Risk Adult  Completed   HPV VACCINES  Aged Out    Health Maintenance  Health Maintenance Due  Topic Date Due   OPHTHALMOLOGY EXAM  02/17/2017   HEMOGLOBIN A1C  04/10/2021    Colorectal cancer screening: No longer required.   {Mammogram status:21018020}  {Bone Density status:21018021}  Lung Cancer Screening: (Low Dose CT Chest recommended if Age 37-80 years, 30 pack-year currently smoking OR have quit w/in 15years.) does not qualify.     Additional Screening:  Hepatitis C Screening: does not qualify  Vision  Screening: Recommended annual ophthalmology exams for early detection of glaucoma and other disorders of the eye. Is the patient up to date with their annual eye exam?  {YES/NO:21197} Who is the provider or what is the name of the office in which the patient attends annual eye exams? *** If pt is not established with a provider, would they like to be referred  to a provider to establish care? {YES/NO:21197}.   Dental Screening: Recommended annual dental exams for proper oral hygiene  Community Resource Referral / Chronic Care Management: CRR required this visit?  {YES/NO:21197}  CCM required this visit?  {YES/NO:21197}     Plan:     I have personally reviewed and noted the following in the patient's chart:   Medical and social history Use of alcohol, tobacco or illicit drugs  Current medications and supplements including opioid prescriptions.  Functional ability and status Nutritional status Physical activity Advanced directives List of other physicians Hospitalizations, surgeries, and ER visits in previous 12 months Vitals Screenings to include cognitive, depression, and falls Referrals and appointments  In addition, I have reviewed and discussed with patient certain preventive protocols, quality metrics, and best practice recommendations. A written personalized care plan for preventive services as well as general preventive health recommendations were provided to patient.   Due to this being a telephonic visit, the after visit summary with patients personalized plan was offered to patient via mail or my-chart. ***Patient declined at this time./ Patient would like to access on my-chart/ per request, patient was mailed a copy of AVS./ Patient preferred to pick up at office at next visit.   Marta Antu, LPN   [phone]  Nurse Health Advisor  Nurse Notes: ***

## 2021-06-23 ENCOUNTER — Encounter: Payer: Self-pay | Admitting: Physician Assistant

## 2021-06-25 ENCOUNTER — Other Ambulatory Visit: Payer: Self-pay | Admitting: Physician Assistant

## 2021-06-25 ENCOUNTER — Telehealth: Payer: Self-pay | Admitting: Family

## 2021-06-25 ENCOUNTER — Encounter: Payer: Self-pay | Admitting: Physician Assistant

## 2021-06-25 DIAGNOSIS — N289 Disorder of kidney and ureter, unspecified: Secondary | ICD-10-CM

## 2021-06-25 DIAGNOSIS — N1330 Unspecified hydronephrosis: Secondary | ICD-10-CM

## 2021-06-25 MED ORDER — MEMANTINE HCL 10 MG PO TABS: ORAL_TABLET | ORAL | 11 refills | Status: DC

## 2021-06-25 NOTE — Telephone Encounter (Signed)
Please call patient's daughter and let her know that I reviewed her ultrasound results with the kidney doctor and they recommended that we do some additional testing.  (Blood work and CT).   Make sure she is not taking any NSAIDS (motrin/aleve etc.) Tylenol is OK.

## 2021-06-26 NOTE — Telephone Encounter (Signed)
Patient 's daughter advised of ct and lab orders. She will try to get this in [place], she will let me know so that I can change the labs to [facility] collect.

## 2021-06-29 NOTE — Progress Notes (Signed)
Subjective:   Sherry Perez is a 80 y.o. female who presents for Medicare Annual (Subsequent) preventive examination.  I connected with [name] today by telephone and verified that I am speaking with the correct person using two identifiers. Location patient: home Location provider: work Persons participating in the virtual visit: patient,daughter, Marine scientist.    I discussed the limitations, risks, security and privacy concerns of performing an evaluation and management service by telephone and the availability of in person appointments. I also discussed with the patient that there may be a patient responsible charge related to this service. The patient expressed understanding and verbally consented to this telephonic visit.    Interactive audio and video telecommunications were attempted between this provider and patient, however failed, due to patient having technical difficulties OR patient did not have access to video capability.  We continued and completed visit with audio only.  Some vital signs may be absent or patient reported.   Time Spent with patient on telephone encounter: 30 minutes Daughter assisted with questions.  Review of Systems     Cardiac Risk Factors include: advanced age (>77mn, >>34women);diabetes mellitus;dyslipidemia;hypertension     Objective:    Today's Vitals   06/30/21 0952  Weight: 173 lb (78.5 kg)  Height: '5\' 6"'$  (1.676 m)   Body mass index is 27.92 kg/m.  Advanced Directives 06/30/2021 06/12/2021 11/06/2019  Does Patient Have a Medical Advance Directive? No No No  Does patient want to make changes to medical advance directive? Yes (MAU/Ambulatory/Procedural Areas - Information given) - -  Would patient like information on creating a medical advance directive? - - No - Patient declined    Current Medications (verified) Outpatient Encounter Medications as of 06/30/2021  Medication Sig   acetaminophen (TYLENOL) 500 MG tablet Take 2 tablets (1,000 mg  total) by mouth 2 (two) times daily.   amLODipine (NORVASC) 10 MG tablet TAKE ONE TABLET BY MOUTH ONE TIME DAILY   aspirin 81 MG tablet Take 81 mg by mouth daily.   atorvastatin (LIPITOR) 80 MG tablet Take 1 tablet (80 mg total) by mouth daily.   cholecalciferol (VITAMIN D) 1000 units tablet Take 1,000 Units by mouth daily.   furosemide (LASIX) 40 MG tablet TAKE ONE TABLET BY MOUTH ONE TIME DAILY   hydrALAZINE (APRESOLINE) 25 MG tablet Take 1 tablet (25 mg total) by mouth 3 (three) times daily.   memantine (NAMENDA) 10 MG tablet Take 1 tablet (10 mg at night) for 2 weeks, then increase to 1 tablet (10 mg) twice a day   metoprolol succinate (TOPROL-XL) 100 MG 24 hr tablet TAKE ONE TABLET BY MOUTH ONE TIME DAILY TAKE WITH OR IMMEDIATELY FOLLOWING A MEAL   potassium chloride SA (KLOR-CON) 20 MEQ tablet Take 0.5 tablets (10 mEq total) by mouth daily.   No facility-administered encounter medications on file as of 06/30/2021.    Allergies (verified) Lisinopril   History: Past Medical History:  Diagnosis Date   Diabetes mellitus without complication (HCC)    HTN (hypertension)    Hyperlipidemia    Past Surgical History:  Procedure Laterality Date   ABDOMINAL HYSTERECTOMY     cataract surgery Left 02/04/2021   Family History  Problem Relation Age of Onset   Diabetes Mother    Diabetes Brother    Social History   Socioeconomic History   Marital status: Widowed    Spouse name: Not on file   Number of children: Not on file   Years of education: Not on file  Highest education level: Not on file  Occupational History   Not on file  Tobacco Use   Smoking status: Never   Smokeless tobacco: Never  Vaping Use   Vaping Use: Never used  Substance and Sexual Activity   Alcohol use: No    Alcohol/week: 0.0 standard drinks   Drug use: Never   Sexual activity: Not on file  Other Topics Concern   Not on file  Social History Narrative   4 children (3 daughters 1 son)- all local   7  grandchildren    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)   Widowed (husband passed 2016)   Lives with [name]   Enjoys cross word puzzle, adult coloring books, reads bible   Right handed    Social Determinants of Health   Financial Resource Strain: Low Risk    Difficulty of Paying Living Expenses: Not hard at all  Food Insecurity: No Food Insecurity   Worried About Charity fundraiser in the Last Year: Never true   [place] in the Last Year: Never true  Transportation Needs: No Transportation Needs   Lack of Transportation (Medical): No   Lack of Transportation (Non-Medical): No  Physical Activity: Inactive   Days of Exercise per Week: 0 days   Minutes of Exercise per Session: 0 min  Stress: No Stress Concern Present   Feeling of Stress : Not at all  Social Connections: Moderately Isolated   Frequency of Communication with Friends and Family: More than three times a week   Frequency of Social Gatherings with Friends and Family: More than three times a week   Attends Religious Services: More than 4 times per year   Active Member of [employer] or Organizations: No   Attends Archivist Meetings: Never   Marital Status: Widowed    Tobacco Counseling Counseling given: Not Answered   Clinical Intake:  Pre-visit preparation completed: Yes  Pain : No/denies pain     Nutritional Status: BMI 25 -29 Overweight Nutritional Risks: None Diabetes: Yes CBG done?: No Did pt. bring in CBG monitor from home?: No (phone visit)  How often do you need to have someone help you when you read instructions, pamphlets, or other written materials from your doctor or pharmacy?: 1 - Never  Diabetes:  Is the patient diabetic?  Yes  If diabetic, was a CBG obtained today?  No  Did the patient bring in their glucometer from home?  No phone visit How often do you monitor your CBG's? never.   Financial Strains and Diabetes Management:  Are you having any financial strains  with the device, your supplies or your medication? No .  Does the patient want to be seen by Chronic Care Management for management of their diabetes?  No  Would the patient like to be referred to a Nutritionist or for Diabetic Management?  No   Diabetic Exams:  Diabetic Eye Exam: Completed 05/2021-per patient's daughter. Awaiting notes.  Diabetic Foot Exam: Completed 10/10/2020   Interpreter Needed?: No  Information entered by :: Caroleen Hamman LPN   Activities of Daily Living In your present state of health, do you have any difficulty performing the following activities: 06/30/2021 02/10/2021  Hearing? N N  Vision? N N  Difficulty concentrating or making decisions? Tempie Donning  Comment seeing neurologist -  Walking or climbing stairs? N N  Dressing or bathing? N N  Doing errands, shopping? N Y  Conservation officer, nature and eating ? N -  Using the Toilet? N -  In the past six months, have you accidently leaked urine? N -  Do you have problems with loss of bowel control? N -  Managing your Medications? N -  Managing your Finances? N -  Housekeeping or managing your Housekeeping? N -  Some recent data might be hidden    Patient Care Team: [name], NP as PCP - General (Internal Medicine) Corliss Parish, MD as Consulting Physician (Nephrology) Elease Hashimoto (Neurology)  Indicate any recent Medical Services you may have received from other than [facility] providers in the past year (date may be approximate).     Assessment:   This is a routine wellness examination for Johni.  Hearing/Vision screen Hearing Screening - Comments:: No issues Vision Screening - Comments:: Wears glasses Last eye exam-05/2021-Dr. Mendel Ryder  Dietary issues and exercise activities discussed: Current Exercise Habits: The patient does not participate in regular exercise at present, Exercise limited by: None identified   Goals Addressed             This Visit's Progress    Patient Stated        Drink more water        Depression Screen PHQ 2/9 Scores 06/30/2021 02/10/2021 11/06/2019 02/24/2018 02/22/2017 10/25/2016 10/11/2016  PHQ - 2 Score 0 0 0 1 1 0 0  PHQ- 9 Score - - - 1 3 - -    Fall Risk Fall Risk  06/30/2021 06/12/2021 02/10/2021 11/06/2019 04/28/2018  Falls in the past year? 0 0 0 0 Yes  Number falls in past yr: 0 0 0 0 1  Injury with Fall? 0 0 0 0 No  Follow up Falls prevention discussed - - Education provided;Falls prevention discussed -    FALL RISK PREVENTION PERTAINING TO THE HOME:  Any stairs in or around the home? Yes  If so, are there any without handrails? No  Home free of loose throw rugs in walkways, pet beds, electrical cords, etc? Yes  Adequate lighting in your home to reduce risk of falls? Yes   ASSISTIVE DEVICES UTILIZED TO PREVENT FALLS:  Life alert? No  Use of a cane, walker or w/c? No  Grab bars in the bathroom? Yes  Shower chair or bench in shower? No  Elevated toilet seat or a handicapped toilet? No   TIMED UP AND GO:  Was the test performed? No . Phone visit   Cognitive Function:Patient sees a neurologist for memory loss. MMSE - Mini Mental State Exam 11/06/2019 07/10/2019  Not completed: Refused -  Orientation to time - 3  Orientation to Place - 3  Registration - 3  Attention/ Calculation - 4  Recall - 0  Language- name 2 objects - 2  Language- repeat - 1  Language- follow 3 step command - 3  Language- read & follow direction - 1  Write a sentence - 1  Copy design - 1  Total score - 22   Montreal Cognitive Assessment  06/12/2021  Visuospatial/ Executive (0/5) 1  Naming (0/3) 0  Attention: Read list of digits (0/2) 1  Attention: Read list of letters (0/1) 0  Attention: Serial 7 subtraction starting at 100 (0/3) 0  Language: Repeat phrase (0/2) 1  Language : Fluency (0/1) 0  Abstraction (0/2) 0  Delayed Recall (0/5) 0  Orientation (0/6) 2  Total 5  Adjusted Score (based on education) 6       Immunizations Immunization History  Administered Date(s) Administered   Fluad Quad(high  Dose 65+) 10/09/2019, 10/10/2020   Influenza Split 10/23/2012, 09/18/2014, 10/08/2015   Influenza, High Dose Seasonal PF 09/13/2016, 10/28/2017, 09/01/2018   Influenza-Unspecified 10/15/2009, 10/23/2010, 09/27/2011   Moderna SARS-COV2 Booster Vaccination 01/12/2021   PFIZER Comirnaty(Gray Top)Covid-19 Tri-Sucrose Vaccine 06/10/2021   PFIZER(Purple Top)SARS-COV-2 Vaccination 02/15/2020, 03/08/2020, 01/12/2021   Pneumococcal Conjugate-13 05/29/2018   Pneumococcal-Unspecified 09/27/2011   Zoster Recombinat (Shingrix) 06/10/2021    TDAP status: Up to date  Flu Vaccine status: Up to date  Pneumococcal vaccine status: Up to date  Covid-19 vaccine status: Completed vaccines  Qualifies for Shingles Vaccine? No   Zostavax completed No   Shingrix Completed?: Yes  Screening Tests Health Maintenance  Topic Date Due   OPHTHALMOLOGY EXAM  02/17/2017   HEMOGLOBIN A1C  04/10/2021   INFLUENZA VACCINE  07/20/2021   Zoster Vaccines- Shingrix (2 of 2) 08/05/2021   FOOT EXAM  10/10/2021   COVID-19 Vaccine (5 - Booster) 10/10/2021   TETANUS/TDAP  12/20/2022   DEXA SCAN  Completed   PNA vac Low Risk Adult  Completed   HPV VACCINES  Aged Out    Health Maintenance  Health Maintenance Due  Topic Date Due   OPHTHALMOLOGY EXAM  02/17/2017   HEMOGLOBIN A1C  04/10/2021    Colorectal cancer screening: No longer required.   Mammogram status: No longer required due to patient declines due to age.  Bone Density status: Declined  Lung Cancer Screening: (Low Dose CT Chest recommended if Age 41-80 years, 30 pack-year currently smoking OR have quit w/in 15years.) does not qualify.     Additional Screening:  Hepatitis C Screening: does not qualify   Vision Screening: Recommended annual ophthalmology exams for early detection of glaucoma and other disorders of the eye. Is the patient up to date with  their annual eye exam? Yes Who is the provider or what is the name of the office in which the patient attends annual eye exams? Dr. Mendel Ryder   Dental Screening: Recommended annual dental exams for proper oral hygiene  Community Resource Referral / Chronic Care Management: CRR required this visit?  No   CCM required this visit?  No      Plan:     I have personally reviewed and noted the following in the patient's chart:   Medical and social history Use of alcohol, tobacco or illicit drugs  Current medications and supplements including opioid prescriptions.  Functional ability and status Nutritional status Physical activity Advanced directives List of other physicians Hospitalizations, surgeries, and ER visits in previous 12 months Vitals Screenings to include cognitive, depression, and falls Referrals and appointments  In addition, I have reviewed and discussed with patient certain preventive protocols, quality metrics, and best practice recommendations. A written personalized care plan for preventive services as well as general preventive health recommendations were provided to patient.   Due to this being a telephonic visit, the after visit summary with patients personalized plan was offered to patient via mail or my-chart.  Patient would like to access on my-chart.   Marta Antu, LPN   [id number]  Nurse Health Advisor  Nurse Notes: None

## 2021-06-30 ENCOUNTER — Ambulatory Visit (INDEPENDENT_AMBULATORY_CARE_PROVIDER_SITE_OTHER): Payer: Medicare HMO

## 2021-06-30 VITALS — Ht 66.0 in | Wt 173.0 lb

## 2021-06-30 DIAGNOSIS — Z0000 Encounter for general adult medical examination without abnormal findings: Secondary | ICD-10-CM | POA: Diagnosis not present

## 2021-06-30 NOTE — Patient Instructions (Signed)
Sherry Perez , Thank you for taking time to complete your Medicare Wellness Visit. I appreciate your ongoing commitment to your health goals. Please review the following plan we discussed and let me know if I can assist you in the future.   Screening recommendations/referrals: Colonoscopy: No longer required Mammogram: Declined Bone Density: Declined Recommended yearly ophthalmology/optometry visit for glaucoma screening and checkup Recommended yearly dental visit for hygiene and checkup  Vaccinations: Influenza vaccine: Up to date Pneumococcal vaccine: Up to date Tdap vaccine: Up to date-Due 12/20/2022 Shingles vaccine: 1st dose completed   Covid-19:Up to date  Advanced directives: Information mailed today  Conditions/risks identified: See problem list  Next appointment: Follow up in one year for your annual wellness visit 07/05/2022 @ 9:40.   Preventive Care 82 Years and Older, Female Preventive care refers to lifestyle choices and visits with your health care provider that can promote health and wellness. What does preventive care include? A yearly physical exam. This is also called an annual well check. Dental exams once or twice a year. Routine eye exams. Ask your health care provider how often you should have your eyes checked. Personal lifestyle choices, including: Daily care of your teeth and gums. Regular physical activity. Eating a healthy diet. Avoiding tobacco and drug use. Limiting alcohol use. Practicing safe sex. Taking low-dose aspirin every day. Taking vitamin and mineral supplements as recommended by your health care provider. What happens during an annual well check? The services and screenings done by your health care provider during your annual well check will depend on your age, overall health, lifestyle risk factors, and family history of disease. Counseling  Your health care provider may ask you questions about your: Alcohol use. Tobacco use. Drug  use. Emotional well-being. Home and relationship well-being. Sexual activity. Eating habits. History of falls. Memory and ability to understand (cognition). Work and work Statistician. Reproductive health. Screening  You may have the following tests or measurements: Height, weight, and BMI. Blood pressure. Lipid and cholesterol levels. These may be checked every 5 years, or more frequently if you are over 82 years old. Skin check. Lung cancer screening. You may have this screening every year starting at age 82 if you have a 30-pack-year history of smoking and currently smoke or have quit within the past 15 years. Fecal occult blood test (FOBT) of the stool. You may have this test every year starting at age 82. Flexible sigmoidoscopy or colonoscopy. You may have a sigmoidoscopy every 5 years or a colonoscopy every 10 years starting at age 82. Hepatitis C blood test. Hepatitis B blood test. Sexually transmitted disease (STD) testing. Diabetes screening. This is done by checking your blood sugar (glucose) after you have not eaten for a while (fasting). You may have this done every 1-3 years. Bone density scan. This is done to screen for osteoporosis. You may have this done starting at age 82. Mammogram. This may be done every 1-2 years. Talk to your health care provider about how often you should have regular mammograms. Talk with your health care provider about your test results, treatment options, and if necessary, the need for more tests. Vaccines  Your health care provider may recommend certain vaccines, such as: Influenza vaccine. This is recommended every year. Tetanus, diphtheria, and acellular pertussis (Tdap, Td) vaccine. You may need a Td booster every 10 years. Zoster vaccine. You may need this after age 13. Pneumococcal 13-valent conjugate (PCV13) vaccine. One  dose is recommended after age 82. Pneumococcal polysaccharide (PPSV23) vaccine. One  dose is recommended after age  82. Talk to your health care provider about which screenings and vaccines you need and how often you need them. This information is not intended to replace advice given to you by your health care provider. Make sure you discuss any questions you have with your health care provider. Document Released: 01/02/2016 Document Revised: 08/25/2016 Document Reviewed: 10/07/2015 Elsevier Interactive Patient Education  2017 [employer] Prevention in the Home Falls can cause injuries. They can happen to people of all ages. There are many things you can do to make your home safe and to help prevent falls. What can I do on the outside of my home? Regularly fix the edges of walkways and driveways and fix any cracks. Remove anything that might make you trip as you walk through a door, such as a raised step or threshold. Trim any bushes or trees on the path to your home. Use bright outdoor lighting. Clear any walking paths of anything that might make someone trip, such as rocks or tools. Regularly check to see if handrails are loose or broken. Make sure that both sides of any steps have handrails. Any raised decks and porches should have guardrails on the edges. Have any leaves, snow, or ice cleared regularly. Use sand or salt on walking paths during winter. Clean up any spills in your garage right away. This includes oil or grease spills. What can I do in the bathroom? Use night lights. Install grab bars by the toilet and in the tub and shower. Do not use towel bars as grab bars. Use non-skid mats or decals in the tub or shower. If you need to sit down in the shower, use a plastic, non-slip stool. Keep the floor dry. Clean up any water that spills on the floor as soon as it happens. Remove soap buildup in the tub or shower regularly. Attach bath mats securely with double-sided non-slip rug tape. Do not have throw rugs and other things on the floor that can make you trip. What can I do in the  bedroom? Use night lights. Make sure that you have a light by your bed that is easy to reach. Do not use any sheets or blankets that are too big for your bed. They should not hang down onto the floor. Have a firm chair that has side arms. You can use this for support while you get dressed. Do not have throw rugs and other things on the floor that can make you trip. What can I do in the kitchen? Clean up any spills right away. Avoid walking on wet floors. Keep items that you use a lot in easy-to-reach places. If you need to reach something above you, use a strong step stool that has a grab bar. Keep electrical cords out of the way. Do not use floor polish or wax that makes floors slippery. If you must use wax, use non-skid floor wax. Do not have throw rugs and other things on the floor that can make you trip. What can I do with my stairs? Do not leave any items on the stairs. Make sure that there are handrails on both sides of the stairs and use them. Fix handrails that are broken or loose. Make sure that handrails are as long as the stairways. Check any carpeting to make sure that it is firmly attached to the stairs. Fix any carpet that is loose or worn. Avoid having throw rugs at the top or bottom of the  stairs. If you do have throw rugs, attach them to the floor with carpet tape. Make sure that you have a light switch at the top of the stairs and the bottom of the stairs. If you do not have them, ask someone to add them for you. What else can I do to help prevent falls? Wear shoes that: Do not have high heels. Have rubber bottoms. Are comfortable and fit you well. Are closed at the toe. Do not wear sandals. If you use a stepladder: Make sure that it is fully opened. Do not climb a closed stepladder. Make sure that both sides of the stepladder are locked into place. Ask someone to hold it for you, if possible. Clearly mark and make sure that you can see: Any grab bars or  handrails. First and last steps. Where the edge of each step is. Use tools that help you move around (mobility aids) if they are needed. These include: Canes. Walkers. Scooters. Crutches. Turn on the lights when you go into a dark area. Replace any light bulbs as soon as they burn out. Set up your furniture so you have a clear path. Avoid moving your furniture around. If any of your floors are uneven, fix them. If there are any pets around you, be aware of where they are. Review your medicines with your doctor. Some medicines can make you feel dizzy. This can increase your chance of falling. Ask your doctor what other things that you can do to help prevent falls. This information is not intended to replace advice given to you by your health care provider. Make sure you discuss any questions you have with your health care provider. Document Released: 10/02/2009 Document Revised: 05/13/2016 Document Reviewed: 01/10/2015 Elsevier Interactive Patient Education  2017 [employer].

## 2021-07-03 ENCOUNTER — Other Ambulatory Visit: Payer: Self-pay

## 2021-07-03 ENCOUNTER — Ambulatory Visit (HOSPITAL_BASED_OUTPATIENT_CLINIC_OR_DEPARTMENT_OTHER)
Admission: RE | Admit: 2021-07-03 | Discharge: 2021-07-03 | Disposition: A | Discharge status: Home / Self Care | Payer: Medicare HMO | Source: Ambulatory Visit | Attending: Family | Admitting: Family

## 2021-07-03 DIAGNOSIS — N1330 Unspecified hydronephrosis: Secondary | ICD-10-CM | POA: Insufficient documentation

## 2021-07-03 DIAGNOSIS — N271 Small kidney, bilateral: Secondary | ICD-10-CM | POA: Diagnosis not present

## 2021-07-03 DIAGNOSIS — K7689 Other specified diseases of liver: Secondary | ICD-10-CM | POA: Diagnosis not present

## 2021-07-03 DIAGNOSIS — K4090 Unilateral inguinal hernia, without obstruction or gangrene, not specified as recurrent: Secondary | ICD-10-CM | POA: Diagnosis not present

## 2021-07-03 DIAGNOSIS — I358 Other nonrheumatic aortic valve disorders: Secondary | ICD-10-CM | POA: Diagnosis not present

## 2021-07-22 ENCOUNTER — Other Ambulatory Visit: Payer: Self-pay | Admitting: Family

## 2021-07-27 ENCOUNTER — Other Ambulatory Visit: Payer: Self-pay | Admitting: Family

## 2021-07-29 ENCOUNTER — Other Ambulatory Visit: Payer: Self-pay

## 2021-07-29 ENCOUNTER — Emergency Department (HOSPITAL_BASED_OUTPATIENT_CLINIC_OR_DEPARTMENT_OTHER)
Admission: EM | Admit: 2021-07-29 | Discharge: 2021-07-29 | Disposition: A | Discharge status: Home / Self Care | Payer: Medicare HMO | Source: Home / Self Care | Attending: Emergency Medicine | Admitting: Emergency Medicine

## 2021-07-29 ENCOUNTER — Emergency Department (HOSPITAL_BASED_OUTPATIENT_CLINIC_OR_DEPARTMENT_OTHER): Payer: Medicare HMO

## 2021-07-29 ENCOUNTER — Ambulatory Visit: Payer: Medicare HMO | Admitting: Family

## 2021-07-29 ENCOUNTER — Encounter (HOSPITAL_BASED_OUTPATIENT_CLINIC_OR_DEPARTMENT_OTHER): Payer: Self-pay

## 2021-07-29 ENCOUNTER — Encounter: Payer: Self-pay | Admitting: Family

## 2021-07-29 DIAGNOSIS — R140 Abdominal distension (gaseous): Secondary | ICD-10-CM | POA: Diagnosis not present

## 2021-07-29 DIAGNOSIS — M25561 Pain in right knee: Secondary | ICD-10-CM | POA: Insufficient documentation

## 2021-07-29 DIAGNOSIS — R404 Transient alteration of awareness: Secondary | ICD-10-CM | POA: Diagnosis not present

## 2021-07-29 DIAGNOSIS — Z79899 Other long term (current) drug therapy: Secondary | ICD-10-CM | POA: Insufficient documentation

## 2021-07-29 DIAGNOSIS — R52 Pain, unspecified: Secondary | ICD-10-CM

## 2021-07-29 DIAGNOSIS — N3289 Other specified disorders of bladder: Secondary | ICD-10-CM | POA: Diagnosis not present

## 2021-07-29 DIAGNOSIS — M25551 Pain in right hip: Secondary | ICD-10-CM | POA: Insufficient documentation

## 2021-07-29 DIAGNOSIS — R4182 Altered mental status, unspecified: Secondary | ICD-10-CM | POA: Insufficient documentation

## 2021-07-29 DIAGNOSIS — K7689 Other specified diseases of liver: Secondary | ICD-10-CM | POA: Diagnosis not present

## 2021-07-29 DIAGNOSIS — I700 Atherosclerosis of aorta: Secondary | ICD-10-CM | POA: Diagnosis not present

## 2021-07-29 DIAGNOSIS — S0990XA Unspecified injury of head, initial encounter: Secondary | ICD-10-CM | POA: Diagnosis not present

## 2021-07-29 DIAGNOSIS — Z20822 Contact with and (suspected) exposure to covid-19: Secondary | ICD-10-CM | POA: Insufficient documentation

## 2021-07-29 DIAGNOSIS — M79661 Pain in right lower leg: Secondary | ICD-10-CM | POA: Diagnosis not present

## 2021-07-29 DIAGNOSIS — E1165 Type 2 diabetes mellitus with hyperglycemia: Secondary | ICD-10-CM | POA: Insufficient documentation

## 2021-07-29 DIAGNOSIS — I10 Essential (primary) hypertension: Secondary | ICD-10-CM | POA: Insufficient documentation

## 2021-07-29 DIAGNOSIS — R7881 Bacteremia: Secondary | ICD-10-CM | POA: Diagnosis not present

## 2021-07-29 DIAGNOSIS — I739 Peripheral vascular disease, unspecified: Secondary | ICD-10-CM | POA: Diagnosis not present

## 2021-07-29 DIAGNOSIS — M2669 Other specified disorders of temporomandibular joint: Secondary | ICD-10-CM | POA: Diagnosis not present

## 2021-07-29 DIAGNOSIS — M79606 Pain in leg, unspecified: Secondary | ICD-10-CM

## 2021-07-29 DIAGNOSIS — N179 Acute kidney failure, unspecified: Secondary | ICD-10-CM | POA: Diagnosis not present

## 2021-07-29 LAB — CBG MONITORING, ED: Glucose-Capillary: 148 mg/dL — ABNORMAL HIGH (ref 70–99)

## 2021-07-29 LAB — COMPREHENSIVE METABOLIC PANEL
ALT: 11 U/L (ref 0–44)
AST: 22 U/L (ref 15–41)
Albumin: 4.2 g/dL (ref 3.5–5.0)
Alkaline Phosphatase: 56 U/L (ref 38–126)
Anion gap: 13 (ref 5–15)
BUN: 22 mg/dL (ref 8–23)
CO2: 21 mmol/L — ABNORMAL LOW (ref 22–32)
Calcium: 9.2 mg/dL (ref 8.9–10.3)
Chloride: 104 mmol/L (ref 98–111)
Creatinine, Ser: 1.51 mg/dL — ABNORMAL HIGH (ref 0.44–1.00)
GFR, Estimated: 34 mL/min — ABNORMAL LOW (ref >60)
Glucose, Bld: 122 mg/dL — ABNORMAL HIGH (ref 70–99)
Potassium: 3.7 mmol/L (ref 3.5–5.1)
Sodium: 138 mmol/L (ref 135–145)
Total Bilirubin: 0.9 mg/dL (ref 0.3–1.2)
Total Protein: 8.1 g/dL (ref 6.5–8.1)

## 2021-07-29 LAB — CBC WITH DIFFERENTIAL/PLATELET
Abs Immature Granulocytes: 0.03 10*3/uL (ref 0.00–0.07)
Basophils Absolute: 0 10*3/uL (ref 0.0–0.1)
Basophils Relative: 0 %
Eosinophils Absolute: 0 10*3/uL (ref 0.0–0.5)
Eosinophils Relative: 0 %
HCT: 37.1 % (ref 36.0–46.0)
Hemoglobin: 12.7 g/dL (ref 12.0–15.0)
Immature Granulocytes: 0 %
Lymphocytes Relative: 4 %
Lymphs Abs: 0.5 10*3/uL — ABNORMAL LOW (ref 0.7–4.0)
MCH: 26.5 pg (ref 26.0–34.0)
MCHC: 34.2 g/dL (ref 30.0–36.0)
MCV: 77.3 fL — ABNORMAL LOW (ref 80.0–100.0)
Monocytes Absolute: 0.4 10*3/uL (ref 0.1–1.0)
Monocytes Relative: 3 %
Neutro Abs: 10.3 10*3/uL — ABNORMAL HIGH (ref 1.7–7.7)
Neutrophils Relative %: 93 %
Platelets: 305 10*3/uL (ref 150–400)
RBC: 4.8 MIL/uL (ref 3.87–5.11)
RDW: 14.6 % (ref 11.5–15.5)
WBC: 11.2 10*3/uL — ABNORMAL HIGH (ref 4.0–10.5)
nRBC: 0 % (ref 0.0–0.2)

## 2021-07-29 LAB — RESP PANEL BY RT-PCR (FLU A&B, COVID) ARPGX2
Influenza A by PCR: NEGATIVE
Influenza B by PCR: NEGATIVE
SARS Coronavirus 2 by RT PCR: NEGATIVE

## 2021-07-29 LAB — URINALYSIS, MICROSCOPIC (REFLEX)

## 2021-07-29 LAB — URINALYSIS, ROUTINE W REFLEX MICROSCOPIC
Bilirubin Urine: NEGATIVE
Glucose, UA: NEGATIVE mg/dL
Hgb urine dipstick: NEGATIVE
Ketones, ur: NEGATIVE mg/dL
Nitrite: NEGATIVE
Protein, ur: NEGATIVE mg/dL
Specific Gravity, Urine: 1.01 (ref 1.005–1.030)
pH: 7 (ref 5.0–8.0)

## 2021-07-29 LAB — LACTIC ACID, PLASMA: Lactic Acid, Venous: 1.7 mmol/L (ref 0.5–1.9)

## 2021-07-29 LAB — AMMONIA: Ammonia: <9 umol/L — ABNORMAL LOW (ref 9–35)

## 2021-07-29 MED ORDER — TRAMADOL HCL 50 MG PO TABS: 50.0000 mg | ORAL_TABLET | Freq: Four times a day (QID) | ORAL | 0 refills | Status: DC | PRN

## 2021-07-29 MED ORDER — ONDANSETRON HCL 4 MG/2ML IJ SOLN
4.0000 mg | Freq: Once | INTRAMUSCULAR | Status: AC
  Administered 2021-07-29: 4 mg via INTRAVENOUS
  Filled 2021-07-29: qty 2

## 2021-07-29 MED ORDER — FENTANYL CITRATE (PF) 100 MCG/2ML IJ SOLN
50.0000 ug | Freq: Once | INTRAMUSCULAR | Status: AC
  Administered 2021-07-29: 50 ug via INTRAVENOUS
  Filled 2021-07-29: qty 2

## 2021-07-29 MED ORDER — IOHEXOL 300 MG/ML  SOLN
75.0000 mL | Freq: Once | INTRAMUSCULAR | Status: AC | PRN
  Administered 2021-07-29: 75 mL via INTRAVENOUS

## 2021-07-29 NOTE — ED Triage Notes (Signed)
Pt arrives with daughter who reports patient got up around 9 am walked to den per family, stated that her left side was hurting, pt has not eaten or had any medications yet today. Pt mumbling, unable to speak, no weakness noted in triage.

## 2021-07-29 NOTE — ED Provider Notes (Signed)
[facility] DEPART[facility] Provider Note   CSN: [id number] Arrival date & time: 07/29/21  [id number]     History Chief Complaint  Patient presents with   Altered Mental Status    LENORA HUEBERT is a 80 y.o. female brought in by her daughter for altered mental status.  Patient has a history of dementia and the daughter gives the history.  There is a level 5 caveat due to this.  Daughter reports that she was last seen normal yesterday evening at 8 PM before she went to bed.  Her daughter left before her mother got up however got a call from her son who stated that the patient was acting abnormally.  Daughter reports that she is complaining that her whole right side is in pain.  She states that she has been refusing to talk and had incontinence which is abnormal for her mother.  She states that she has been grunting and breathing very quickly.  She is never acted like this before.  She has been ambulatory.  Her daughter does not report a facial droop.   Altered Mental Status     Past Medical History:  Diagnosis Date   Diabetes mellitus without complication ([place])    HTN (hypertension)    Hyperlipidemia     Patient Active Problem List   Diagnosis Date Noted   Dementia ([place]) 04/08/2021   Right knee pain 06/12/2018   Right shoulder pain 11/07/2017   Osteoarthritis 09/13/2016   Diabetes type 2, controlled ([place]) 06/07/2016   HTN (hypertension) 06/07/2016   Vitamin D deficiency 06/07/2016   Hyperlipidemia 06/07/2016    Past Surgical History:  Procedure Laterality Date   ABDOMINAL HYSTERECTOMY     cataract surgery Left 02/04/2021     OB History   No obstetric history on file.     Family History  Problem Relation Age of Onset   Diabetes Mother    Diabetes Brother     Social History   Tobacco Use   Smoking status: Never   Smokeless tobacco: Never  Vaping Use   Vaping Use: Never used  Substance Use Topics   Alcohol use: No    Alcohol/week: 0.0 standard  drinks   Drug use: Never    Home Medications Prior to Admission medications   Medication Sig Start Date End Date Taking? Authorizing Provider  acetaminophen (TYLENOL) 500 MG tablet Take 2 tablets (1,000 mg total) by mouth 2 (two) times daily. 06/19/20   [name], NP  amLODipine (NORVASC) 10 MG tablet TAKE ONE TABLET BY MOUTH ONE TIME DAILY 12/12/20   [name], NP  aspirin 81 MG tablet Take 81 mg by mouth daily.    [provider]  atorvastatin (LIPITOR) 80 MG tablet TAKE ONE TABLET BY MOUTH ONE TIME DAILY 07/28/21   [name], NP  cholecalciferol (VITAMIN D) 1000 units tablet Take 1,000 Units by mouth daily.    [provider]  furosemide (LASIX) 40 MG tablet TAKE ONE TABLET BY MOUTH ONE TIME DAILY 07/28/21   [name], NP  hydrALAZINE (APRESOLINE) 25 MG tablet Take 1 tablet (25 mg total) by mouth 3 (three) times daily. 02/10/21   [name], NP  memantine (NAMENDA) 10 MG tablet Take 1 tablet (10 mg at night) for 2 weeks, then increase to 1 tablet (10 mg) twice a day 06/25/21   Rondel Jumbo, PA-C  metoprolol succinate (TOPROL-XL) 100 MG 24 hr tablet TAKE ONE TABLET BY MOUTH ONE TIME DAILY TAKE WITH OR  IMMEDIATELY FOLLOWING A MEAL 05/19/21   [name], NP  potassium chloride SA (KLOR-CON) 20 MEQ tablet TAKE ONE-HALF TABLET BY MOUTH ONE TIME DAILY 07/28/21   [name], NP    Allergies    Lisinopril  Review of Systems   Review of Systems Unable to review systems secondary to dementia Physical Exam Updated Vital Signs BP (!) 166/63 (BP Location: Right Arm)   Pulse 63   Temp 98.4 F (36.9 C) (Oral)   Resp (!) 24   Ht '5\' 7"'$  (1.702 m)   SpO2 100%   BMI 27.10 kg/m   Physical Exam Vitals and nursing note reviewed.  Constitutional:      General: She is not in acute distress.    Appearance: She is well-developed. She is not diaphoretic.  HENT:     Head: Normocephalic and atraumatic.     Right Ear:  External ear normal.     Left Ear: External ear normal.     Nose: Nose normal.     Mouth/Throat:     Mouth: Mucous membranes are moist.  Eyes:     General: No scleral icterus.    Conjunctiva/sclera: Conjunctivae normal.  Cardiovascular:     Rate and Rhythm: Normal rate and regular rhythm.     Heart sounds: Normal heart sounds. No murmur heard.   No friction rub. No gallop.  Pulmonary:     Effort: Tachypnea present.     Breath sounds: Normal breath sounds.  Abdominal:     General: Bowel sounds are normal. There is no distension.     Palpations: Abdomen is soft. There is no mass.     Tenderness: There is no abdominal tenderness. There is no guarding.  Musculoskeletal:     Cervical back: Normal range of motion.     Comments: No tenderness to palpation of the shoulder region or back.  Skin:    General: Skin is warm and dry.  Neurological:     Mental Status: She is alert and oriented to person, place, and time.     GCS: GCS eye subscore is 4. GCS verbal subscore is 5. GCS motor subscore is 6.     Cranial Nerves: No cranial nerve deficit, dysarthria or facial asymmetry.     Motor: No weakness or pronator drift.     Deep Tendon Reflexes: Reflexes normal.     Comments: Patient is able to speak when prompted.  She does not have an obvious facial droop.  Speech is clear without dysarthria.  She is at her baseline level of confusion per the daughter. No pronator drift, equal bilateral grip strength, equal leg strength normal reflexes Unable to appropriately assess sensation, gait deferred.  Psychiatric:        Behavior: Behavior normal.    ED Results / Procedures / Treatments   Labs (all labs ordered are listed, but only abnormal results are displayed) Labs Reviewed  CBG MONITORING, ED - Abnormal; Notable for the following components:      Result Value   Glucose-Capillary 148 (*)    All other components within normal limits  CULTURE, BLOOD (ROUTINE X 2)  CULTURE, BLOOD (ROUTINE X  2)  COMPREHENSIVE METABOLIC PANEL  CBC WITH DIFFERENTIAL/PLATELET  URINALYSIS, COMPLETE (UACMP) WITH MICROSCOPIC  LACTIC ACID, PLASMA  AMMONIA  URINALYSIS, ROUTINE W REFLEX MICROSCOPIC    EKG None  Radiology CT HEAD WO CONTRAST  Result Date: 07/29/2021 CLINICAL DATA:  Trauma EXAM: CT HEAD WITHOUT CONTRAST TECHNIQUE: Contiguous axial images were obtained from the  base of the skull through the vertex without intravenous contrast. COMPARISON:  Head CT 02/25/2020 FINDINGS: Brain: There is no evidence of acute intracranial hemorrhage, extra-axial fluid collection, or infarct. Foci of hypodensity in the subcortical and periventricular white matter are nonspecific but likely reflect sequela of chronic white matter microangiopathy, not significantly changed. Mild parenchymal volume loss is unchanged. The ventricles are stable in size. There is no midline shift. No mass lesion is identified. Vascular: There is calcification of the bilateral cavernous ICAs. Skull: There is minimal stranding in the left parietal scalp without underlying calvarial fracture. There is no suspicious osseous lesion. Sinuses/Orbits: The imaged paranasal sinuses are clear. The mastoid air cells are clear. A left lens implant is noted. The globes and orbits are otherwise unremarkable. Other: There is degenerative change of the left temporomandibular joint. IMPRESSION: 1. No acute intracranial hemorrhage or calvarial fracture. 2. Stable mild parenchymal volume loss and chronic white matter microangiopathy. Electronically Signed   By: Valetta Mole MD   On: 07/29/2021 11:17   DG Chest Port 1 View  Result Date: 07/29/2021 CLINICAL DATA:  The altered Mental status EXAM: PORTABLE CHEST 1 VIEW COMPARISON:  Chest radiograph 04/08/2021 FINDINGS: The cardiomediastinal silhouette is stable. Calcified atherosclerotic plaque is again noted at the aortic arch. The lungs are clear, with no focal consolidation or pulmonary edema. There is no  pleural effusion or pneumothorax. A small density in the right apex may reflect a calcified granuloma. The bones are unremarkable. IMPRESSION: No radiographic evidence of acute cardiopulmonary process. Electronically Signed   By: Valetta Mole MD   On: 07/29/2021 11:20    Procedures Procedures   Medications Ordered in ED Medications - No data to display  ED Course  I have reviewed the triage vital signs and the nursing notes.  Pertinent labs & imaging results that were available during my care of the patient were reviewed by me and considered in my medical decision making (see chart for details).  Clinical Course as of 07/30/21 1014  Wed Jul 29, 2021  1316 Patient continues to moan and is tremulous in the right arm.  She does not seem to have any significant abdominal tenderness.  She does not have a fecal impaction on my examination.  I am ordering an x-ray of the right hip and right knee for further evaluation [AH]  1318 Patient has bounding DP pulses bilaterally.  She has full range of motion of the left leg.  She seems to have pain with internal and external rotation of the right hip and movement of the right knee.  Daughter reports no recent falls. [AH]    Clinical Course User Index [AH] Margarita Mail, PA-C   MDM Rules/Calculators/A&P CC: Altered mental status, unexplained pain VS:  Vitals:   07/29/21 1506 07/29/21 1545 07/29/21 1619 07/29/21 1630  BP: (!) 178/62 (!) 157/62 (!) 164/84 (!) 156/74  Pulse: 64 69 60 65  Resp: (!) 22 (!) '23 18 20  '$ Temp:      TempSrc:      SpO2: 97% 99% 94% 99%  Height:        [id number] is gathered by patient's daughter at bedside and EMR. Previous records obtained and reviewed. DDX:The patient's complaint of altered mental status involves an extensive number of diagnostic and treatment options, and is a complaint that carries with it a high risk of complications, morbidity, and potential mortality. Given the large differential diagnosis, medical  decision making is of high complexity. The differential diagnosis for AMS  is extensive and includes, but is not limited to: drug overdose - opioids, alcohol, sedatives, antipsychotics, drug withdrawal, others; Metabolic: hypoxia, hypoglycemia, hyperglycemia, hypercalcemia, hypernatremia, hyponatremia, uremia, hepatic encephalopathy, hypothyroidism, hyperthyroidism, vitamin B12 or thiamine deficiency, carbon monoxide poisoning, Wilson's disease, Lactic acidosis, DKA/HHOS; Infectious: meningitis, encephalitis, bacteremia/sepsis, urinary tract infection, pneumonia, neurosyphilis; Structural: Space-occupying lesion, (brain tumor, subdural hematoma, hydrocephalus,); Vascular: stroke, subarachnoid hemorrhage, coronary ischemia, hypertensive encephalopathy, CNS vasculitis, thrombotic thrombocytopenic purpura, disseminated intravascular coagulation, hyperviscosity; Psychiatric: Schizophrenia, depression; Other: Seizure, hypothermia, heat stroke, ICU psychosis, dementia -"sundowning."  Labs: I ordered reviewed and interpreted labs which include CBC shows mildly elevated white count, lactic acid within normal limits, mildly elevated blood glucose, CMP with baseline renal insufficiency urine does not appear infected.  Respiratory panel is negative for COVID or influenza.  Imaging: I ordered and reviewed images which included CT abdomen pelvis with contrast, right femur, 1-2 view pelvis, portable 1 view chest x-ray, CT head without contrast. I independently visualized and interpreted all imaging. There are no acute, significant findings on today's images. EKG: Sinus rhythm at a rate of 60 Consults: MDM: This is an elderly female with dementia who is unable to give any accurate history or tell me where specifically she is hurting.  She is currently at her baseline level of confusion and she has less of an altered mental status and more of a behavior change per her daughter she had stool incontinence today.  She has been  refusing to talk at times.  She talks if you prompt her and make her talk.  She does not any obvious neurologic deficits.  She is back to her baseline mental status after pain control which is improved her moaning and groaning.  She has basically unremarkable work-up and stable vital signs.  All of her imaging is negative for acute abnormality.  I checked her for fecal impaction she does not have this present.  At this time I cannot find any cause of her symptoms.  She seems to be improved.  In shared decision-making with the patient's daughter we have decided to send her home with pain control which seems to have improved the symptoms.  She is advised to return immediately for any new or worsening symptoms and to follow very closely with her primary care physician if symptoms are not improving or worsening.  Patient seen and shared visit with Dr. Godfrey Pick who agrees with assessment, work-up and plan for discharge at this time. Patient disposition:The patient appears reasonably stabilized for admission considering the current resources, flow, and capabilities available in the ED at this time, and I doubt any other [facility] requiring further screening and/or treatment in the ED prior to admission.        Final Clinical Impression(s) / ED Diagnoses Final diagnoses:  AMS (altered mental status)    Rx / DC Orders ED Discharge Orders     None        Margarita Mail, PA-C 07/30/21 1020    Godfrey Pick, MD 07/31/21 1023

## 2021-07-29 NOTE — Telephone Encounter (Signed)
The pt daughter has called back and I have offered the appt at 1120, but since pt is having hard time breathing she is going to take mom to ER. FYI to provider.

## 2021-07-29 NOTE — ED Notes (Signed)
Patient transported to X-ray 

## 2021-07-29 NOTE — ED Triage Notes (Signed)
PA At beside pt able to state that she is in [facility], daughter at bedside reports some dementia. Daughter reports last known normal was last night before patient went to bed states that when she was at work patient was in bed states family called and reported patient was acting different.

## 2021-07-29 NOTE — Discharge Instructions (Addendum)
Get help right away if you: Have severe pain. Have trouble breathing. Lose consciousness. Have chest pain or pressure that lasts for more than a few minutes, or if you have other symptoms along with chest pain, including if you: Have pain or discomfort in one or both arms, your back, neck, jaw, or stomach. Have shortness of breath. Break out in a cold sweat. Feel nauseous. Become light-headed. 

## 2021-07-29 NOTE — Progress Notes (Incomplete)
Subjective:   By signing my name below, I, Zite Okoli, attest that this documentation has been prepared under the direction and in the presence of '@ENCPROV'$ @. '@ENCDATE'$ @     Patient ID: Sherry Perez, female    DOB: [birth year], 80 y.o.   MRN: [id number]  No chief complaint on file.   HPI Patient is in today for an office visit.  Hypertension- Hyperlipidemia- Diabetes-  Past Medical History:  Diagnosis Date   Diabetes mellitus without complication ([facility])    HTN (hypertension)    Hyperlipidemia     Past Surgical History:  Procedure Laterality Date   ABDOMINAL HYSTERECTOMY     cataract surgery Left 02/04/2021    Family History  Problem Relation Age of Onset   Diabetes Mother    Diabetes Brother     Social History   Socioeconomic History   Marital status: Widowed    Spouse name: Not on file   Number of children: Not on file   Years of education: Not on file   Highest education level: Not on file  Occupational History   Not on file  Tobacco Use   Smoking status: Never   Smokeless tobacco: Never  Vaping Use   Vaping Use: Never used  Substance and Sexual Activity   Alcohol use: No    Alcohol/week: 0.0 standard drinks   Drug use: Never   Sexual activity: Not on file  Other Topics Concern   Not on file  Social History Narrative   4 children (3 daughters 1 son)- all local   7 grandchildren    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)   Widowed (husband passed 2016)   Lives with [name]   Enjoys cross word puzzle, adult coloring books, reads bible   Right handed    Social Determinants of Health   Financial Resource Strain: Low Risk    Difficulty of Paying Living Expenses: Not hard at all  Food Insecurity: No Food Insecurity   Worried About Charity fundraiser in the Last Year: Never true   [place] in the Last Year: Never true  Transportation Needs: No Transportation Needs   Lack of Transportation (Medical): No   Lack of Transportation  (Non-Medical): No  Physical Activity: Inactive   Days of Exercise per Week: 0 days   Minutes of Exercise per Session: 0 min  Stress: No Stress Concern Present   Feeling of Stress : Not at all  Social Connections: Moderately Isolated   Frequency of Communication with Friends and Family: More than three times a week   Frequency of Social Gatherings with Friends and Family: More than three times a week   Attends Religious Services: More than 4 times per year   Active Member of [employer] or Organizations: No   Attends Archivist Meetings: Never   Marital Status: Widowed  Human resources officer Violence: Not At Risk   Fear of Current or Ex-Partner: No   Emotionally Abused: No   Physically Abused: No   Sexually Abused: No    Outpatient Medications Prior to Visit  Medication Sig Dispense Refill   acetaminophen (TYLENOL) 500 MG tablet Take 2 tablets (1,000 mg total) by mouth 2 (two) times daily. 30 tablet 0   amLODipine (NORVASC) 10 MG tablet TAKE ONE TABLET BY MOUTH ONE TIME DAILY 90 tablet 1   aspirin 81 MG tablet Take 81 mg by mouth daily.     atorvastatin (LIPITOR) 80 MG tablet TAKE ONE TABLET BY  MOUTH ONE TIME DAILY 90 tablet 1   cholecalciferol (VITAMIN D) 1000 units tablet Take 1,000 Units by mouth daily.     furosemide (LASIX) 40 MG tablet TAKE ONE TABLET BY MOUTH ONE TIME DAILY 90 tablet 1   hydrALAZINE (APRESOLINE) 25 MG tablet Take 1 tablet (25 mg total) by mouth 3 (three) times daily. 270 tablet 1   memantine (NAMENDA) 10 MG tablet Take 1 tablet (10 mg at night) for 2 weeks, then increase to 1 tablet (10 mg) twice a day 60 tablet 11   metoprolol succinate (TOPROL-XL) 100 MG 24 hr tablet TAKE ONE TABLET BY MOUTH ONE TIME DAILY TAKE WITH OR IMMEDIATELY FOLLOWING A MEAL 90 tablet 1   potassium chloride SA (KLOR-CON) 20 MEQ tablet TAKE ONE-HALF TABLET BY MOUTH ONE TIME DAILY 45 tablet 1   No facility-administered medications prior to visit.    Allergies  Allergen Reactions    Lisinopril     Renal insufficiency    ROS     Objective:    Physical Exam Constitutional:      General: She is not in acute distress.    Appearance: Normal appearance. She is not ill-appearing.  HENT:     Head: Normocephalic and atraumatic.     Right Ear: External ear normal.     Left Ear: External ear normal.  Eyes:     Extraocular Movements: Extraocular movements intact.     Pupils: Pupils are equal, round, and reactive to light.  Cardiovascular:     Rate and Rhythm: Normal rate and regular rhythm.     Pulses: Normal pulses.     Heart sounds: Normal heart sounds. No murmur heard. Pulmonary:     Effort: Pulmonary effort is normal. No respiratory distress.     Breath sounds: Normal breath sounds. No wheezing or rhonchi.  Abdominal:     General: Bowel sounds are normal. There is no distension.     Palpations: Abdomen is soft.     Tenderness: There is no abdominal tenderness. There is no guarding or rebound.  Musculoskeletal:     Cervical back: Neck supple.  Lymphadenopathy:     Cervical: No cervical adenopathy.  Skin:    General: Skin is warm and dry.  Neurological:     Mental Status: She is alert and oriented to person, place, and time.  Psychiatric:        Behavior: Behavior normal.        Judgment: Judgment normal.    There were no vitals taken for this visit. Wt Readings from Last 3 Encounters:  06/30/21 173 lb (78.5 kg)  06/12/21 173 lb 6.4 oz (78.7 kg)  06/10/21 175 lb (79.4 kg)    Diabetic Foot Exam - Simple   No data filed    Lab Results  Component Value Date   WBC 7.9 01/27/2018   HGB 12.4 01/27/2018   HCT 36.8 01/27/2018   PLT 310 01/27/2018   GLUCOSE 82 02/10/2021   CHOL 198 10/10/2020   TRIG 64 10/10/2020   HDL 79 10/10/2020   LDLCALC 104 (H) 10/10/2020   ALT 10 10/10/2020   AST 17 10/10/2020   NA 140 02/10/2021   K 5.0 02/10/2021   CL 103 02/10/2021   CREATININE 1.45 (H) 02/10/2021   BUN 16 02/10/2021   CO2 27 02/10/2021   TSH 3.75  06/12/2021   HGBA1C 5.5 10/10/2020   MICROALBUR 0.7 06/07/2016    Lab Results  Component Value Date   TSH 3.75 06/12/2021  Lab Results  Component Value Date   WBC 7.9 01/27/2018   HGB 12.4 01/27/2018   HCT 36.8 01/27/2018   MCV 78.3 (L) 01/27/2018   PLT 310 01/27/2018   Lab Results  Component Value Date   NA 140 02/10/2021   K 5.0 02/10/2021   CO2 27 02/10/2021   GLUCOSE 82 02/10/2021   BUN 16 02/10/2021   CREATININE 1.45 (H) 02/10/2021   BILITOT 0.4 10/10/2020   ALKPHOS 72 07/10/2019   AST 17 10/10/2020   ALT 10 10/10/2020   PROT 7.3 10/10/2020   ALBUMIN 4.1 07/10/2019   CALCIUM 9.7 02/10/2021   GFR 33.74 (L) 02/10/2021   Lab Results  Component Value Date   CHOL 198 10/10/2020   Lab Results  Component Value Date   HDL 79 10/10/2020   Lab Results  Component Value Date   LDLCALC 104 (H) 10/10/2020   Lab Results  Component Value Date   TRIG 64 10/10/2020   Lab Results  Component Value Date   CHOLHDL 2.5 10/10/2020   Lab Results  Component Value Date   HGBA1C 5.5 10/10/2020       Assessment & Plan:   Problem List Items Addressed This Visit   None   No orders of the defined types were placed in this encounter.    I,Zite Okoli,acting as a Education administrator for [employer], NP.,have documented all relevant documentation on the behalf of Nance Pear, NP,as directed by  Nance Pear, NP while in the presence of Nance Pear, NP.   I, [name], NP 07/29/2021, personally preformed the services described in this documentation.  All medical record entries made by the scribe were at my direction and in my presence.  I have reviewed the chart and discharge instructions (if applicable) and agree that the record reflects my personal performance and is accurate and complete. 07/29/2021

## 2021-07-30 ENCOUNTER — Other Ambulatory Visit: Payer: Self-pay

## 2021-07-30 ENCOUNTER — Inpatient Hospital Stay (HOSPITAL_BASED_OUTPATIENT_CLINIC_OR_DEPARTMENT_OTHER)
Admission: EM | Admit: 2021-07-30 | Discharge: 2021-08-04 | DRG: 872 | Disposition: A | Discharge status: Home / Self Care | Payer: Medicare HMO | Attending: Internal Medicine | Admitting: Internal Medicine

## 2021-07-30 ENCOUNTER — Inpatient Hospital Stay (HOSPITAL_COMMUNITY): Payer: Medicare HMO

## 2021-07-30 ENCOUNTER — Encounter (HOSPITAL_BASED_OUTPATIENT_CLINIC_OR_DEPARTMENT_OTHER): Payer: Self-pay

## 2021-07-30 ENCOUNTER — Telehealth (HOSPITAL_BASED_OUTPATIENT_CLINIC_OR_DEPARTMENT_OTHER): Payer: Self-pay

## 2021-07-30 DIAGNOSIS — B957 Other staphylococcus as the cause of diseases classified elsewhere: Secondary | ICD-10-CM | POA: Diagnosis not present

## 2021-07-30 DIAGNOSIS — Z7982 Long term (current) use of aspirin: Secondary | ICD-10-CM

## 2021-07-30 DIAGNOSIS — Z20822 Contact with and (suspected) exposure to covid-19: Secondary | ICD-10-CM | POA: Diagnosis present

## 2021-07-30 DIAGNOSIS — R7303 Prediabetes: Secondary | ICD-10-CM

## 2021-07-30 DIAGNOSIS — R69 Illness, unspecified: Secondary | ICD-10-CM | POA: Diagnosis not present

## 2021-07-30 DIAGNOSIS — E1122 Type 2 diabetes mellitus with diabetic chronic kidney disease: Secondary | ICD-10-CM | POA: Diagnosis not present

## 2021-07-30 DIAGNOSIS — I129 Hypertensive chronic kidney disease with stage 1 through stage 4 chronic kidney disease, or unspecified chronic kidney disease: Secondary | ICD-10-CM | POA: Diagnosis not present

## 2021-07-30 DIAGNOSIS — Z833 Family history of diabetes mellitus: Secondary | ICD-10-CM

## 2021-07-30 DIAGNOSIS — M7918 Myalgia, other site: Secondary | ICD-10-CM | POA: Diagnosis present

## 2021-07-30 DIAGNOSIS — E876 Hypokalemia: Secondary | ICD-10-CM | POA: Diagnosis present

## 2021-07-30 DIAGNOSIS — I10 Essential (primary) hypertension: Secondary | ICD-10-CM | POA: Diagnosis present

## 2021-07-30 DIAGNOSIS — Z79899 Other long term (current) drug therapy: Secondary | ICD-10-CM | POA: Diagnosis not present

## 2021-07-30 DIAGNOSIS — R197 Diarrhea, unspecified: Secondary | ICD-10-CM | POA: Diagnosis present

## 2021-07-30 DIAGNOSIS — N1832 Chronic kidney disease, stage 3b: Secondary | ICD-10-CM | POA: Diagnosis present

## 2021-07-30 DIAGNOSIS — F0390 Unspecified dementia without behavioral disturbance: Secondary | ICD-10-CM | POA: Diagnosis present

## 2021-07-30 DIAGNOSIS — E119 Type 2 diabetes mellitus without complications: Secondary | ICD-10-CM

## 2021-07-30 DIAGNOSIS — R7881 Bacteremia: Secondary | ICD-10-CM | POA: Diagnosis not present

## 2021-07-30 DIAGNOSIS — B958 Unspecified staphylococcus as the cause of diseases classified elsewhere: Secondary | ICD-10-CM | POA: Diagnosis present

## 2021-07-30 DIAGNOSIS — R519 Headache, unspecified: Secondary | ICD-10-CM | POA: Diagnosis present

## 2021-07-30 DIAGNOSIS — N179 Acute kidney failure, unspecified: Secondary | ICD-10-CM

## 2021-07-30 DIAGNOSIS — R4182 Altered mental status, unspecified: Secondary | ICD-10-CM | POA: Diagnosis present

## 2021-07-30 DIAGNOSIS — E785 Hyperlipidemia, unspecified: Secondary | ICD-10-CM | POA: Diagnosis not present

## 2021-07-30 DIAGNOSIS — N261 Atrophy of kidney (terminal): Secondary | ICD-10-CM | POA: Diagnosis not present

## 2021-07-30 LAB — CBC
HCT: 37.7 % (ref 36.0–46.0)
Hemoglobin: 12.2 g/dL (ref 12.0–15.0)
MCH: 26 pg (ref 26.0–34.0)
MCHC: 32.4 g/dL (ref 30.0–36.0)
MCV: 80.2 fL (ref 80.0–100.0)
Platelets: 271 10*3/uL (ref 150–400)
RBC: 4.7 MIL/uL (ref 3.87–5.11)
RDW: 44.5 % — ABNORMAL HIGH (ref 11.5–15.5)
WBC: 9 10*3/uL (ref 4.0–10.5)
nRBC: 0 % (ref 0.0–0.2)

## 2021-07-30 LAB — BLOOD CULTURE ID PANEL (REFLEXED) - BCID2

## 2021-07-30 LAB — CBC WITH DIFFERENTIAL/PLATELET
Abs Immature Granulocytes: 0.03 10*3/uL (ref 0.00–0.07)
Basophils Absolute: 0 10*3/uL (ref 0.0–0.1)
Basophils Relative: 0 %
Eosinophils Absolute: 0 10*3/uL (ref 0.0–0.5)
Eosinophils Relative: 0 %
HCT: 37.4 % (ref 36.0–46.0)
Hemoglobin: 12.8 g/dL (ref 12.0–15.0)
Immature Granulocytes: 0 %
Lymphocytes Relative: 9 %
Lymphs Abs: 0.8 10*3/uL (ref 0.7–4.0)
MCH: 26.7 pg (ref 26.0–34.0)
MCHC: 34.2 g/dL (ref 30.0–36.0)
MCV: 77.9 fL — ABNORMAL LOW (ref 80.0–100.0)
Monocytes Absolute: 0.5 10*3/uL (ref 0.1–1.0)
Monocytes Relative: 6 %
Neutro Abs: 7.6 10*3/uL (ref 1.7–7.7)
Neutrophils Relative %: 85 %
Platelets: 306 10*3/uL (ref 150–400)
RBC: 4.8 MIL/uL (ref 3.87–5.11)
RDW: 15.2 % (ref 11.5–15.5)
WBC: 9 10*3/uL (ref 4.0–10.5)
nRBC: 0 % (ref 0.0–0.2)

## 2021-07-30 LAB — PROCALCITONIN: Procalcitonin: 0.21 ng/mL

## 2021-07-30 LAB — LACTIC ACID, PLASMA
Lactic Acid, Venous: 2.6 mmol/L (ref 0.5–1.9)
Lactic Acid, Venous: 1.3 mmol/L (ref 0.5–1.9)

## 2021-07-30 LAB — CREATININE, SERUM
Creatinine, Ser: 2.23 mg/dL — ABNORMAL HIGH (ref 0.44–1.00)
GFR, Estimated: 21 mL/min — ABNORMAL LOW (ref >60)

## 2021-07-30 MED ORDER — VANCOMYCIN HCL IN DEXTROSE 1-5 GM/200ML-% IV SOLN: 1000.0000 mg | INTRAVENOUS | Status: DC

## 2021-07-30 MED ORDER — POTASSIUM CHLORIDE CRYS ER 20 MEQ PO TBCR
30.0000 meq | EXTENDED_RELEASE_TABLET | ORAL | Status: AC
  Administered 2021-07-30: 30 meq via ORAL
  Filled 2021-07-30: qty 1

## 2021-07-30 MED ORDER — METOPROLOL SUCCINATE ER 50 MG PO TB24
50.0000 mg | ORAL_TABLET | Freq: Every day | ORAL | Status: DC
  Administered 2021-07-31 – 2021-08-01 (×2): 50 mg via ORAL
  Filled 2021-07-30 (×2): qty 1

## 2021-07-30 MED ORDER — ENOXAPARIN SODIUM 30 MG/0.3ML IJ SOSY
30.0000 mg | PREFILLED_SYRINGE | INTRAMUSCULAR | Status: DC
  Administered 2021-07-30 – 2021-07-31 (×2): 30 mg via SUBCUTANEOUS
  Filled 2021-07-30 (×2): qty 0.3

## 2021-07-30 MED ORDER — ONDANSETRON HCL 4 MG PO TABS: 4.0000 mg | ORAL_TABLET | Freq: Four times a day (QID) | ORAL | Status: DC | PRN

## 2021-07-30 MED ORDER — SODIUM CHLORIDE 0.9 % IV BOLUS
1000.0000 mL | Freq: Once | INTRAVENOUS | Status: AC
  Administered 2021-07-30: 1000 mL via INTRAVENOUS

## 2021-07-30 MED ORDER — SODIUM CHLORIDE 0.9 % IV SOLN: INTRAVENOUS | Status: DC

## 2021-07-30 MED ORDER — SENNOSIDES-DOCUSATE SODIUM 8.6-50 MG PO TABS: 1.0000 | ORAL_TABLET | Freq: Every evening | ORAL | Status: DC | PRN

## 2021-07-30 MED ORDER — SODIUM CHLORIDE 0.9 % IV SOLN
2.0000 g | Freq: Once | INTRAVENOUS | Status: AC
  Administered 2021-07-30: 2 g via INTRAVENOUS
  Filled 2021-07-30: qty 20

## 2021-07-30 MED ORDER — MEMANTINE HCL 10 MG PO TABS
10.0000 mg | ORAL_TABLET | Freq: Every day | ORAL | Status: DC
  Administered 2021-07-30 – 2021-08-03 (×5): 10 mg via ORAL
  Filled 2021-07-30 (×5): qty 1

## 2021-07-30 MED ORDER — ACETAMINOPHEN 650 MG RE SUPP: 650.0000 mg | Freq: Four times a day (QID) | RECTAL | Status: DC | PRN

## 2021-07-30 MED ORDER — SODIUM CHLORIDE 0.9% FLUSH
3.0000 mL | Freq: Two times a day (BID) | INTRAVENOUS | Status: DC
  Administered 2021-07-31 – 2021-08-04 (×6): 3 mL via INTRAVENOUS

## 2021-07-30 MED ORDER — KETOROLAC TROMETHAMINE 30 MG/ML IJ SOLN: 30.0000 mg | Freq: Once | INTRAMUSCULAR | Status: DC

## 2021-07-30 MED ORDER — HYDRALAZINE HCL 25 MG PO TABS
25.0000 mg | ORAL_TABLET | Freq: Three times a day (TID) | ORAL | Status: DC
  Administered 2021-07-30 – 2021-08-02 (×10): 25 mg via ORAL
  Filled 2021-07-30 (×10): qty 1

## 2021-07-30 MED ORDER — ONDANSETRON HCL 4 MG/2ML IJ SOLN: 4.0000 mg | Freq: Four times a day (QID) | INTRAMUSCULAR | Status: DC | PRN

## 2021-07-30 MED ORDER — ACETAMINOPHEN 325 MG PO TABS
650.0000 mg | ORAL_TABLET | Freq: Four times a day (QID) | ORAL | Status: DC | PRN
  Administered 2021-07-31 – 2021-08-03 (×2): 650 mg via ORAL
  Filled 2021-07-30 (×2): qty 2

## 2021-07-30 MED ORDER — TRAMADOL HCL 50 MG PO TABS
50.0000 mg | ORAL_TABLET | Freq: Two times a day (BID) | ORAL | Status: DC | PRN
Start: 2021-07-30 — End: 2021-08-04

## 2021-07-30 MED ORDER — VANCOMYCIN HCL IN DEXTROSE 1-5 GM/200ML-% IV SOLN
1000.0000 mg | Freq: Once | INTRAVENOUS | Status: AC
  Administered 2021-07-30: 1000 mg via INTRAVENOUS
  Filled 2021-07-30: qty 200

## 2021-07-30 MED ORDER — ATORVASTATIN CALCIUM 40 MG PO TABS
80.0000 mg | ORAL_TABLET | Freq: Every day | ORAL | Status: DC
  Administered 2021-07-31 – 2021-08-04 (×5): 80 mg via ORAL
  Filled 2021-07-30 (×5): qty 2

## 2021-07-30 MED ORDER — VITAMIN D 25 MCG (1000 UNIT) PO TABS
1000.0000 [IU] | ORAL_TABLET | Freq: Every day | ORAL | Status: DC
  Administered 2021-07-31 – 2021-08-04 (×5): 1000 [IU] via ORAL
  Filled 2021-07-30 (×5): qty 1

## 2021-07-30 NOTE — ED Notes (Signed)
Report called to accepting RN and [facility]. All questions and concerns addressed.

## 2021-07-30 NOTE — ED Provider Notes (Signed)
[facility] EMERGENCY DEPARTMENT Provider Note   CSN: [id number] Arrival date & time: 07/30/21  0934     History Chief Complaint  Patient presents with   Headache    Sherry Perez is a 80 y.o. female with a history of dementia, hypertension, hyperlipidemia, presenting from home with complaint of headaches, myalgia, diarrhea.  Patient is present with her daughter at bedside, provides history.  She is a poor historian and her daughter confirmed that she does have advanced dementia.  The patient was seen yesterday in the ED complaining of myalgias, diarrhea.  She had blood test on including blood cultures and was discharged home.  She had an x-ray of the chest and CT scan of the abdomen showed no obvious source, as well as a COVID test which was negative, and a UA which showed trace leukocytes, negative nitrites, and a few bacteria.  Her CT scan of the head was unremarkable.  Her ammonia level was normal.  Her white blood cell count was 11,000.  Her lactate was normal at 1.7.  Subsequently after discharge her blood cultures 2 bottles came back positive for Staphylococcus or gram-positive cocci.  Her family was contacted and her daughter brought her back into the ED.  She reports the patient continues to complain of a frontal headache and loose bowel movements and myalgias.  She has had a dry cough as well.  HPI     Past Medical History:  Diagnosis Date   Diabetes mellitus without complication ([place])    HTN (hypertension)    Hyperlipidemia     Patient Active Problem List   Diagnosis Date Noted   Dementia ([place]) 04/08/2021   Right knee pain 06/12/2018   Right shoulder pain 11/07/2017   Osteoarthritis 09/13/2016   Diabetes type 2, controlled ([place]) 06/07/2016   HTN (hypertension) 06/07/2016   Vitamin D deficiency 06/07/2016   Hyperlipidemia 06/07/2016    Past Surgical History:  Procedure Laterality Date   ABDOMINAL HYSTERECTOMY     cataract surgery Left 02/04/2021      OB History   No obstetric history on file.     Family History  Problem Relation Age of Onset   Diabetes Mother    Diabetes Brother     Social History   Tobacco Use   Smoking status: Never   Smokeless tobacco: Never  Vaping Use   Vaping Use: Never used  Substance Use Topics   Alcohol use: No    Alcohol/week: 0.0 standard drinks   Drug use: Never    Home Medications Prior to Admission medications   Medication Sig Start Date End Date Taking? Authorizing Provider  acetaminophen (TYLENOL) 500 MG tablet Take 2 tablets (1,000 mg total) by mouth 2 (two) times daily. 06/19/20   [name], NP  amLODipine (NORVASC) 10 MG tablet TAKE ONE TABLET BY MOUTH ONE TIME DAILY 12/12/20   [name], NP  aspirin 81 MG tablet Take 81 mg by mouth daily.    [provider]  atorvastatin (LIPITOR) 80 MG tablet TAKE ONE TABLET BY MOUTH ONE TIME DAILY 07/28/21   [name], NP  cholecalciferol (VITAMIN D) 1000 units tablet Take 1,000 Units by mouth daily.    [provider]  furosemide (LASIX) 40 MG tablet TAKE ONE TABLET BY MOUTH ONE TIME DAILY 07/28/21   [name], NP  hydrALAZINE (APRESOLINE) 25 MG tablet Take 1 tablet (25 mg total) by mouth 3 (three) times daily. 02/10/21   [name], NP  memantine [facility]) 10  MG tablet Take 1 tablet (10 mg at night) for 2 weeks, then increase to 1 tablet (10 mg) twice a day 06/25/21   Rondel Jumbo, PA-C  metoprolol succinate (TOPROL-XL) 100 MG 24 hr tablet TAKE ONE TABLET BY MOUTH ONE TIME DAILY TAKE WITH OR IMMEDIATELY FOLLOWING A MEAL 05/19/21   [name], NP  potassium chloride SA (KLOR-CON) 20 MEQ tablet TAKE ONE-HALF TABLET BY MOUTH ONE TIME DAILY 07/28/21   [name], NP  traMADol (ULTRAM) 50 MG tablet Take 1 tablet (50 mg total) by mouth every 6 (six) hours as needed. 07/29/21   Margarita Mail, PA-C    Allergies    Lisinopril  Review of Systems   Review of Systems   Unable to perform ROS: Dementia (level 5 caveat)   Physical Exam Updated Vital Signs BP (!) 138/49 (BP Location: Right Arm)   Pulse (!) 57   Temp 99 F (37.2 C)   Resp 16   Ht '5\' 7"'$  (1.702 m)   Wt 78.5 kg   SpO2 100%   BMI 27.11 kg/m   Physical Exam Constitutional:      General: She is not in acute distress. HENT:     Head: Normocephalic and atraumatic.  Eyes:     Conjunctiva/sclera: Conjunctivae normal.     Pupils: Pupils are equal, round, and reactive to light.  Cardiovascular:     Rate and Rhythm: Normal rate and regular rhythm.  Pulmonary:     Effort: Pulmonary effort is normal. No respiratory distress.  Abdominal:     General: There is no distension.     Tenderness: There is no abdominal tenderness.  Musculoskeletal:     Cervical back: Normal range of motion and neck supple. No rigidity.  Lymphadenopathy:     Cervical: No cervical adenopathy.  Skin:    General: Skin is warm and dry.  Neurological:     General: No focal deficit present.     Mental Status: Mental status is at baseline.    ED Results / Procedures / Treatments   Labs (all labs ordered are listed, but only abnormal results are displayed) Labs Reviewed  CBC WITH DIFFERENTIAL/PLATELET - Abnormal; Notable for the following components:      Result Value   MCV 77.9 (*)    All other components within normal limits  COMPREHENSIVE METABOLIC PANEL - Abnormal; Notable for the following components:   Potassium 3.2 (*)    BUN 28 (*)    Creatinine, Ser 2.66 (*)    GFR, Estimated 17 (*)    Anion gap 16 (*)    All other components within normal limits    EKG None  Radiology DG Pelvis 1-2 Views  Result Date: 07/29/2021 CLINICAL DATA:  Right lower extremity pain.  No known injury. EXAM: PELVIS - 1-2 VIEW COMPARISON:  None. FINDINGS: There is no evidence of pelvic fracture or diastasis. No pelvic bone lesions are seen. IMPRESSION: Negative. Electronically Signed   By: Marijo Conception M.D.   On:  07/29/2021 14:28   CT HEAD WO CONTRAST  Result Date: 07/29/2021 CLINICAL DATA:  Trauma EXAM: CT HEAD WITHOUT CONTRAST TECHNIQUE: Contiguous axial images were obtained from the base of the skull through the vertex without intravenous contrast. COMPARISON:  Head CT 02/25/2020 FINDINGS: Brain: There is no evidence of acute intracranial hemorrhage, extra-axial fluid collection, or infarct. Foci of hypodensity in the subcortical and periventricular white matter are nonspecific but likely reflect sequela of chronic white matter microangiopathy, not significantly changed.  Mild parenchymal volume loss is unchanged. The ventricles are stable in size. There is no midline shift. No mass lesion is identified. Vascular: There is calcification of the bilateral cavernous ICAs. Skull: There is minimal stranding in the left parietal scalp without underlying calvarial fracture. There is no suspicious osseous lesion. Sinuses/Orbits: The imaged paranasal sinuses are clear. The mastoid air cells are clear. A left lens implant is noted. The globes and orbits are otherwise unremarkable. Other: There is degenerative change of the left temporomandibular joint. IMPRESSION: 1. No acute intracranial hemorrhage or calvarial fracture. 2. Stable mild parenchymal volume loss and chronic white matter microangiopathy. Electronically Signed   By: Valetta Mole MD   On: 07/29/2021 11:17   CT ABDOMEN PELVIS W CONTRAST  Result Date: 07/29/2021 CLINICAL DATA:  Abdominal distension. EXAM: CT ABDOMEN AND PELVIS WITH CONTRAST TECHNIQUE: Multidetector CT imaging of the abdomen and pelvis was performed using the standard protocol following bolus administration of intravenous contrast. CONTRAST:  26m OMNIPAQUE IOHEXOL 300 MG/ML  SOLN COMPARISON:  July 03, 2021. FINDINGS: Lower chest: No acute abnormality. Hepatobiliary: No cholelithiasis or biliary dilatation is noted. Stable probable hepatic cysts are noted. Pancreas: Unremarkable. No pancreatic  ductal dilatation or surrounding inflammatory changes. Spleen: Normal in size without focal abnormality. Adrenals/Urinary Tract: Adrenal glands appear normal. Stable bilateral renal cortical scarring is noted. No hydronephrosis or renal obstruction is noted. No renal or ureteral calculi are noted. Mild urinary bladder distention is noted. Stomach/Bowel: Stomach is within normal limits. Appendix appears normal. No evidence of bowel wall thickening, distention, or inflammatory changes. Vascular/Lymphatic: Aortic atherosclerosis. No enlarged abdominal or pelvic lymph nodes. Reproductive: Status post hysterectomy. No adnexal masses. Other: No abdominal wall hernia or abnormality. No abdominopelvic ascites. Musculoskeletal: No acute or significant osseous findings. IMPRESSION: Stable hepatic cysts. Stable bilateral renal cortical scarring. No acute abnormality seen in the abdomen or pelvis. Aortic Atherosclerosis (ICD10-I70.0). Electronically Signed   By: JMarijo ConceptionM.D.   On: 07/29/2021 16:17   DG Chest Port 1 View  Result Date: 07/29/2021 CLINICAL DATA:  The altered Mental status EXAM: PORTABLE CHEST 1 VIEW COMPARISON:  Chest radiograph 04/08/2021 FINDINGS: The cardiomediastinal silhouette is stable. Calcified atherosclerotic plaque is again noted at the aortic arch. The lungs are clear, with no focal consolidation or pulmonary edema. There is no pleural effusion or pneumothorax. A small density in the right apex may reflect a calcified granuloma. The bones are unremarkable. IMPRESSION: No radiographic evidence of acute cardiopulmonary process. Electronically Signed   By: PValetta MoleMD   On: 07/29/2021 11:20   DG FEMUR, MIN 2 VIEWS RIGHT  Result Date: 07/29/2021 CLINICAL DATA:  Right lower extremity pain without known injury. EXAM: RIGHT FEMUR 2 VIEWS COMPARISON:  None. FINDINGS: There is no evidence of fracture or other focal bone lesions. Soft tissues are unremarkable. IMPRESSION: Negative.  Electronically Signed   By: JMarijo ConceptionM.D.   On: 07/29/2021 14:30    Procedures Procedures   Medications Ordered in ED Medications  sodium chloride 0.9 % bolus 1,000 mL (has no administration in time range)  cefTRIAXone (ROCEPHIN) 2 g in sodium chloride 0.9 % 100 mL IVPB (has no administration in time range)    ED Course  I have reviewed the triage vital signs and the nursing notes.  Pertinent labs & imaging results that were available during my care of the patient were reviewed by me and considered in my medical decision making (see chart for details).  80year old female presented ED  with bacteremia, positive blood cultures drawn yesterday.  She is afebrile with normal vital signs on arrival.  No tachycardia.  Labs today show a downtrending white blood cell count, currently 9.0, with no left shift.  She does have elevated creatinine at 2.6, which is an AKI, with elevated BUN as well.  Lactate also elevated.  This is likely related to poor p.o. intake.  IV fluids been ordered.  IV vancomycin and IV Rocephin been ordered as broad-spectrum coverage for both MRSA and staph as well as possible pulmonary or urinary source of infection.  Seems less likely a viral infection such as COVID or flu with negative results.  I likewise think this is less likely acute meningitis.  The patient has no nuchal rigidity, she is afebrile, has a downtrending white blood cell count.  She has no photophobia.  She will need new blood cultures, we can recheck a lactate level, and she will require medical admission.  I discussed this with her daughter who is in agreement.  Clinical Course as of 07/30/21 1628  Thu Jul 30, 2021  1113 Admitted to hospitalist  [MT]  1114 Covid tested negative yesterday [MT]    Clinical Course User Index [MT] Lorina Duffner, Carola Rhine, MD   Final Clinical Impression(s) / ED Diagnoses Final diagnoses:  None    Rx / DC Orders ED Discharge Orders     None        Wyvonnia Dusky, MD 07/30/21 [phone]

## 2021-07-30 NOTE — ED Triage Notes (Signed)
Pt daughter at bedside. Pt c/o 10/10 headache starting this morning when she woke up. Pt reports she has had headaches like this before. Pt A/O X4, not speaking much due to "pain on right side of head". Charge RN states pt was called and left a message to come into [facility] due to positive blood cultures drawn from labs yesterday. Pt in ER 07/29/21 due to leg pain. Family states they didn't see that message until they arrived here for her headache.

## 2021-07-30 NOTE — Progress Notes (Signed)
Pharmacy Antibiotic Note  Sherry Perez is a 80 y.o. female admitted on 07/30/2021 with bacteremia.  Pharmacy has been consulted for vancomycin dosing. Pt is afebrile and WBC is WNL. Lactic acid was normal yesterday. Of note, Scr is up significantly.   Plan: Vancomycin 1gm IV Q48H F/u renal fxn, C&S, clinical status and peak/trough at [facility] F/u finalized culture  Height: '5\' 7"'$  (170.2 cm) Weight: 78.5 kg (173 lb 1 oz) IBW/kg (Calculated) : 61.6  Temp (24hrs), Avg:99 F (37.2 C), Min:99 F (37.2 C), Max:99 F (37.2 C)  Recent Labs  Lab 07/29/21 1100 07/30/21 1003  WBC 11.2* 9.0  CREATININE 1.51* 2.66*  LATICACIDVEN 1.7  --     Estimated Creatinine Clearance: 17.6 mL/min (A) (by C-G formula based on SCr of 2.66 mg/dL (H)).    Allergies  Allergen Reactions   Lisinopril     Renal insufficiency    Antimicrobials this admission: Vanc 8/11>> CTX x 1 8/11  Dose adjustments this admission: N/A  Microbiology results: 8/10 Blood - 2/4 GPC>>staph species on BCID  Thank you for allowing pharmacy to be a part of this patient's care.  Florabel Faulks, Rande Lawman 07/30/2021 11:08 AM

## 2021-07-30 NOTE — Telephone Encounter (Signed)
This Probation officer made aware of positive BC by [employer], Trifan MD advises patient follow up for possible admission. Attempted to call patient and patient daughter with no answer, left message, will continue to try to reach patient/daughter.

## 2021-07-30 NOTE — H&P (Signed)
History and Physical    Sherry Perez [id number] DOB: [birth year] DOA: 07/30/2021  PCP: [name], NP   Patient coming from: [place]  Chief Complaint  Patient presents with   Headache     HPI: Sherry Perez is a 80 y.o. female with medical history significant for advanced dementia, hypertension, hyperlipidemia, controlled diabetes who was seen in the ED with multiple complaints, not feeling well, headache, myalgia, diarrhea, had work-up in the ED that was not impressive blood cultures were drawn and patient was discharged home.  Today 2 out of 4 blood cultures tested positive for Staphylococcus was called back to the ED and transferred to [facility] for further admission. In the ED repeat labs shows worsening of renal function patient was given ceftriaxone, vancomycin and being admitted. IN ED Course:Hemodynamically stable saturating well on room air no tachycardia or hypotension. Labs showed worsening of renal function. On my eval daughter and son at the bedside. Patient is mumbling words, offers no complaints. Daughter reports she had some pain on rt leg and diarrhea yesterday but none now.  Review of Systems: All systems were reviewed and were negative except as mentioned in HPI above. Negative for fever Negative for chest pain Negative for shortness of breath  Past Medical History:  Diagnosis Date   Diabetes mellitus without complication (HCC)    HTN (hypertension)    Hyperlipidemia     Past Surgical History:  Procedure Laterality Date   ABDOMINAL HYSTERECTOMY     cataract surgery Left 02/04/2021     reports that she has never smoked. She has never used smokeless tobacco. She reports that she does not drink alcohol and does not use drugs.  Allergies  Allergen Reactions   Lisinopril     Renal insufficiency    Family History  Problem Relation Age of Onset   Diabetes Mother    Diabetes Brother      Prior to Admission medications    Medication Sig Start Date End Date Taking? Authorizing Provider  acetaminophen (TYLENOL) 500 MG tablet Take 2 tablets (1,000 mg total) by mouth 2 (two) times daily. 06/19/20   [name], NP  amLODipine (NORVASC) 10 MG tablet TAKE ONE TABLET BY MOUTH ONE TIME DAILY 12/12/20   [name], NP  aspirin 81 MG tablet Take 81 mg by mouth daily.    [provider]  atorvastatin (LIPITOR) 80 MG tablet TAKE ONE TABLET BY MOUTH ONE TIME DAILY 07/28/21   [name], NP  cholecalciferol (VITAMIN D) 1000 units tablet Take 1,000 Units by mouth daily.    [provider]  furosemide (LASIX) 40 MG tablet TAKE ONE TABLET BY MOUTH ONE TIME DAILY 07/28/21   [name], NP  hydrALAZINE (APRESOLINE) 25 MG tablet Take 1 tablet (25 mg total) by mouth 3 (three) times daily. 02/10/21   [name], NP  memantine (NAMENDA) 10 MG tablet Take 1 tablet (10 mg at night) for 2 weeks, then increase to 1 tablet (10 mg) twice a day 06/25/21   Rondel Jumbo, PA-C  metoprolol succinate (TOPROL-XL) 100 MG 24 hr tablet TAKE ONE TABLET BY MOUTH ONE TIME DAILY TAKE WITH OR IMMEDIATELY FOLLOWING A MEAL 05/19/21   [name], NP  potassium chloride SA (KLOR-CON) 20 MEQ tablet TAKE ONE-HALF TABLET BY MOUTH ONE TIME DAILY 07/28/21   [name], NP  traMADol (ULTRAM) 50 MG tablet Take 1 tablet (50 mg total) by mouth every 6 (six) hours as needed. 07/29/21   Kenton Kingfisher,  Maryjane Hurter    Physical Exam: Vitals:   07/30/21 1004 07/30/21 1100 07/30/21 1400 07/30/21 1536  BP: (!) 138/49 (!) 154/57 (!) 169/65 (!) 146/73  Pulse: (!) 57 63 72 68  Resp: 16 18 (!) 32 20  Temp: 99 F (37.2 C)   98.3 F (36.8 C)  TempSrc:    Oral  SpO2: 100% 100% 99% 100%  Weight:      Height:        General exam: AAO to self, people, obese,NAD,weak appearing. HEENT:Oral mucosa moist, Ear/Nose WNL grossly, dentition normal. Respiratory system: bilaterally clear,no wheezing or crackles,no use of  accessory muscle Cardiovascular system: S1 & S2 +,no JVD. Gastrointestinal system: Abdomen soft,NT,ND,BS+. Nervous System:Alert, awake, moving extremities and grossly nonfocal. Extremities: No edema, distal peripheral pulses palpable.  Skin: No rashes,no icterus. MSK: Normal muscle bulk,tone, power   Labs on Admission: I have personally reviewed following labs and imaging studies  CBC: Recent Labs  Lab 07/29/21 1100 07/30/21 1003  WBC 11.2* 9.0  NEUTROABS 10.3* 7.6  HGB 12.7 12.8  HCT 37.1 37.4  MCV 77.3* 77.9*  PLT 305 [id number]   Basic Metabolic Panel: Recent Labs  Lab 07/29/21 1100 07/30/21 1003  NA 138 139  K 3.7 3.2*  CL 104 100  CO2 21* 23  GLUCOSE 122* 99  BUN 22 28*  CREATININE 1.51* 2.66*  CALCIUM 9.2 9.7   GFR: Estimated Creatinine Clearance: 17.6 mL/min (A) (by C-G formula based on SCr of 2.66 mg/dL (H)). Liver Function Tests: Recent Labs  Lab 07/29/21 1100 07/30/21 1003  AST 22 23  ALT 11 10  ALKPHOS 56 57  BILITOT 0.9 1.0  PROT 8.1 7.9  ALBUMIN 4.2 4.3   No results for input(s): LIPASE, AMYLASE in the last 168 hours. Recent Labs  Lab 07/29/21 1100  AMMONIA <9*   Coagulation Profile: No results for input(s): INR, PROTIME in the last 168 hours. Cardiac Enzymes: No results for input(s): CKTOTAL, CKMB, CKMBINDEX, TROPONINI in the last 168 hours. BNP (last 3 results) No results for input(s): PROBNP in the last 8760 hours. HbA1C: No results for input(s): HGBA1C in the last 72 hours. CBG: Recent Labs  Lab 07/29/21 1007  GLUCAP 148*   Lipid Profile: No results for input(s): CHOL, HDL, LDLCALC, TRIG, CHOLHDL, LDLDIRECT in the last 72 hours. Thyroid Function Tests: No results for input(s): TSH, T4TOTAL, FREET4, T3FREE, THYROIDAB in the last 72 hours. Anemia Panel: No results for input(s): VITAMINB12, FOLATE, FERRITIN, TIBC, IRON, RETICCTPCT in the last 72 hours. Urine analysis:    Component Value Date/Time   COLORURINE STRAW (A) 07/29/2021  1310   APPEARANCEUR CLEAR 07/29/2021 1310   LABSPEC 1.010 07/29/2021 1310   PHURINE 7.0 07/29/2021 1310   GLUCOSEU NEGATIVE 07/29/2021 1310   HGBUR NEGATIVE 07/29/2021 1310   BILIRUBINUR NEGATIVE 07/29/2021 1310   KETONESUR NEGATIVE 07/29/2021 1310   PROTEINUR NEGATIVE 07/29/2021 1310   NITRITE NEGATIVE 07/29/2021 1310   LEUKOCYTESUR TRACE (A) 07/29/2021 1310    Radiological Exams on Admission: DG Pelvis 1-2 Views  Result Date: 07/29/2021 CLINICAL DATA:  Right lower extremity pain.  No known injury. EXAM: PELVIS - 1-2 VIEW COMPARISON:  None. FINDINGS: There is no evidence of pelvic fracture or diastasis. No pelvic bone lesions are seen. IMPRESSION: Negative. Electronically Signed   By: Marijo Conception M.D.   On: 07/29/2021 14:28   CT HEAD WO CONTRAST  Result Date: 07/29/2021 CLINICAL DATA:  Trauma EXAM: CT HEAD WITHOUT CONTRAST TECHNIQUE: Contiguous axial images were  obtained from the base of the skull through the vertex without intravenous contrast. COMPARISON:  Head CT 02/25/2020 FINDINGS: Brain: There is no evidence of acute intracranial hemorrhage, extra-axial fluid collection, or infarct. Foci of hypodensity in the subcortical and periventricular white matter are nonspecific but likely reflect sequela of chronic white matter microangiopathy, not significantly changed. Mild parenchymal volume loss is unchanged. The ventricles are stable in size. There is no midline shift. No mass lesion is identified. Vascular: There is calcification of the bilateral cavernous ICAs. Skull: There is minimal stranding in the left parietal scalp without underlying calvarial fracture. There is no suspicious osseous lesion. Sinuses/Orbits: The imaged paranasal sinuses are clear. The mastoid air cells are clear. A left lens implant is noted. The globes and orbits are otherwise unremarkable. Other: There is degenerative change of the left temporomandibular joint. IMPRESSION: 1. No acute intracranial hemorrhage or  calvarial fracture. 2. Stable mild parenchymal volume loss and chronic white matter microangiopathy. Electronically Signed   By: Valetta Mole MD   On: 07/29/2021 11:17   CT ABDOMEN PELVIS W CONTRAST  Result Date: 07/29/2021 CLINICAL DATA:  Abdominal distension. EXAM: CT ABDOMEN AND PELVIS WITH CONTRAST TECHNIQUE: Multidetector CT imaging of the abdomen and pelvis was performed using the standard protocol following bolus administration of intravenous contrast. CONTRAST:  81m OMNIPAQUE IOHEXOL 300 MG/ML  SOLN COMPARISON:  July 03, 2021. FINDINGS: Lower chest: No acute abnormality. Hepatobiliary: No cholelithiasis or biliary dilatation is noted. Stable probable hepatic cysts are noted. Pancreas: Unremarkable. No pancreatic ductal dilatation or surrounding inflammatory changes. Spleen: Normal in size without focal abnormality. Adrenals/Urinary Tract: Adrenal glands appear normal. Stable bilateral renal cortical scarring is noted. No hydronephrosis or renal obstruction is noted. No renal or ureteral calculi are noted. Mild urinary bladder distention is noted. Stomach/Bowel: Stomach is within normal limits. Appendix appears normal. No evidence of bowel wall thickening, distention, or inflammatory changes. Vascular/Lymphatic: Aortic atherosclerosis. No enlarged abdominal or pelvic lymph nodes. Reproductive: Status post hysterectomy. No adnexal masses. Other: No abdominal wall hernia or abnormality. No abdominopelvic ascites. Musculoskeletal: No acute or significant osseous findings. IMPRESSION: Stable hepatic cysts. Stable bilateral renal cortical scarring. No acute abnormality seen in the abdomen or pelvis. Aortic Atherosclerosis (ICD10-I70.0). Electronically Signed   By: JMarijo ConceptionM.D.   On: 07/29/2021 16:17   DG Chest Port 1 View  Result Date: 07/29/2021 CLINICAL DATA:  The altered Mental status EXAM: PORTABLE CHEST 1 VIEW COMPARISON:  Chest radiograph 04/08/2021 FINDINGS: The cardiomediastinal silhouette  is stable. Calcified atherosclerotic plaque is again noted at the aortic arch. The lungs are clear, with no focal consolidation or pulmonary edema. There is no pleural effusion or pneumothorax. A small density in the right apex may reflect a calcified granuloma. The bones are unremarkable. IMPRESSION: No radiographic evidence of acute cardiopulmonary process. Electronically Signed   By: PValetta MoleMD   On: 07/29/2021 11:20   DG FEMUR, MIN 2 VIEWS RIGHT  Result Date: 07/29/2021 CLINICAL DATA:  Right lower extremity pain without known injury. EXAM: RIGHT FEMUR 2 VIEWS COMPARISON:  None. FINDINGS: There is no evidence of fracture or other focal bone lesions. Soft tissues are unremarkable. IMPRESSION: Negative. Electronically Signed   By: JMarijo ConceptionM.D.   On: 07/29/2021 14:30     Assessment/Plan  Staphylococcus bacteremia 2/4 bottles Unclear source.  Started on vancomycin continue the same, obtain echocardiogram.  She had CT abdomen pelvis with contrast 8/10 no acute finding.  Chest x-ray and CT head no  acute finding.  Will need ID consultation in the morning.Currently afebrile hemodynamically stable, initial leukocytosis from yesterday has resolved.  Repeat blood cultures were sent at this morning.  AKI on CKD IIIb: previous creatinine back in feb was 1.4-1.5.  Overnight worsening patient did receive contrast CT scan yesterday.  Gentle IV fluids overnight hold Lasix and nephrotoxic medication.  Check UA, urinary tract showed renal cortical scarring no hydronephrosis and no renal obstruction in the CT scan yesterday.  There wa s mild urinary bladder distention, obtain renal ultrasound. Of note pt had contrast CT 8/10- ?CIN. Recent Labs    10/10/20 1505 02/10/21 1058 07/29/21 1100 07/30/21 1003  BUN 30* 16 22 28*  CREATININE 2.24* 1.45* 1.51* 2.66*   Diabetes type 2, controlled: Stable HTN: Stable resume home meds tomorrow Hyperlipidemia: Resume statin once med rec done Advanced dementia  poor historian continue fall precaution aspiration precaution, delirium precaution. Daughter staying the night. Cont namenda.   Body mass index is 27.11 kg/m.   Severity of Illness: The appropriate patient status for this patient is INPATIENT for evaluation further management of bacteremia  DVT prophylaxis: enoxaparin (LOVENOX) injection 30 mg Start: 07/30/21 1815 SCDs Start: 07/30/21 1721 Code Status:   Code Status: Full Code  Family Communication: Admission, patients condition and plan of care including tests being ordered have been discussed with her daughter  and son who indicate understanding and agree with the plan and Code Status.  Consults called:   Antonieta Pert MD [facility]  If 7PM-7AM, please contact night-coverage [email or web address]  07/30/2021, 5:29 PM

## 2021-07-30 NOTE — Progress Notes (Signed)
I approved patient's daughter, [name], to spend the night. [name] lives with this patient at her home.   Thank you,   Judson Roch

## 2021-07-30 NOTE — Plan of Care (Signed)
Patient's family at bedside, education provided. Will need continuous cuing.    [place], Therapist, sports

## 2021-07-31 ENCOUNTER — Inpatient Hospital Stay (HOSPITAL_COMMUNITY): Payer: Medicare HMO

## 2021-07-31 DIAGNOSIS — R7881 Bacteremia: Secondary | ICD-10-CM

## 2021-07-31 DIAGNOSIS — N179 Acute kidney failure, unspecified: Secondary | ICD-10-CM

## 2021-07-31 DIAGNOSIS — F0390 Unspecified dementia without behavioral disturbance: Secondary | ICD-10-CM

## 2021-07-31 LAB — COMPREHENSIVE METABOLIC PANEL
ALT: 11 U/L (ref 0–44)
AST: 22 U/L (ref 15–41)
Albumin: 3.5 g/dL (ref 3.5–5.0)
Alkaline Phosphatase: 45 U/L (ref 38–126)
Anion gap: 9 (ref 5–15)
BUN: 23 mg/dL (ref 8–23)
CO2: 22 mmol/L (ref 22–32)
Calcium: 8.6 mg/dL — ABNORMAL LOW (ref 8.9–10.3)
Chloride: 110 mmol/L (ref 98–111)
Creatinine, Ser: 1.98 mg/dL — ABNORMAL HIGH (ref 0.44–1.00)
GFR, Estimated: 25 mL/min — ABNORMAL LOW (ref >60)
Glucose, Bld: 98 mg/dL (ref 70–99)
Potassium: 3.6 mmol/L (ref 3.5–5.1)
Sodium: 141 mmol/L (ref 135–145)
Total Bilirubin: 0.7 mg/dL (ref 0.3–1.2)
Total Protein: 6.9 g/dL (ref 6.5–8.1)

## 2021-07-31 LAB — ECHOCARDIOGRAM COMPLETE
AR max vel: 2.5 cm2
AV Area VTI: 1.85 cm2
AV Area mean vel: 2.06 cm2
AV Mean grad: 7 mmHg
AV Peak grad: 12.3 mmHg
Ao pk vel: 1.75 m/s
Area-P 1/2: 2.8 cm2
Calc EF: 74.1 %
Height: 67 in
S' Lateral: 2.5 cm
Single Plane A2C EF: 74.4 %
Single Plane A4C EF: 74.8 %
Weight: 2768.98 oz

## 2021-07-31 LAB — CBC
HCT: 33.8 % — ABNORMAL LOW (ref 36.0–46.0)
Hemoglobin: 11.2 g/dL — ABNORMAL LOW (ref 12.0–15.0)
MCH: 26.4 pg (ref 26.0–34.0)
MCHC: 33.1 g/dL (ref 30.0–36.0)
MCV: 79.5 fL — ABNORMAL LOW (ref 80.0–100.0)
Platelets: 267 10*3/uL (ref 150–400)
RBC: 4.25 MIL/uL (ref 3.87–5.11)
RDW: 15.2 % (ref 11.5–15.5)
WBC: 6.6 10*3/uL (ref 4.0–10.5)
nRBC: 0 % (ref 0.0–0.2)

## 2021-07-31 LAB — PROTIME-INR
INR: 1.3 — ABNORMAL HIGH (ref 0.8–1.2)
Prothrombin Time: 15.9 seconds — ABNORMAL HIGH (ref 11.4–15.2)

## 2021-07-31 LAB — PROCALCITONIN: Procalcitonin: 0.19 ng/mL

## 2021-07-31 MED ORDER — AMLODIPINE BESYLATE 10 MG PO TABS
10.0000 mg | ORAL_TABLET | Freq: Every day | ORAL | Status: DC
  Administered 2021-07-31 – 2021-08-04 (×5): 10 mg via ORAL
  Filled 2021-07-31 (×5): qty 1

## 2021-07-31 MED ORDER — VANCOMYCIN HCL 1250 MG/250ML IV SOLN
1250.0000 mg | INTRAVENOUS | Status: DC
  Administered 2021-08-01: 1250 mg via INTRAVENOUS
  Filled 2021-07-31: qty 250

## 2021-07-31 NOTE — Plan of Care (Signed)
  Problem: Clinical Measurements: Goal: Will remain free from infection Outcome: Progressing   Problem: Safety: Goal: Ability to remain free from injury will improve Outcome: Progressing   Problem: Skin Integrity: Goal: Risk for impaired skin integrity will decrease Outcome: Progressing   

## 2021-07-31 NOTE — Progress Notes (Signed)
Pharmacy Antibiotic Note  Sherry Perez is a 80 y.o. female admitted on 07/30/2021 with bacteremia with Staph hominis.  Pharmacy has been consulted for vancomycin dosing.   Day #2 abx - Afebrile - WBC is WNL - Lactic acid 2.6 > 1.3 - PCT 0.21 >> 0.19 - AKI on CKD, SCr improved today  Plan: Adjust vancomycin to '1250mg'$  IV q48h for estimated AUC 500 using SCr 1.98 F/u renal fxn, C&S, clinical status and peak/trough at [facility] F/u finalized culture  Height: '5\' 7"'$  (170.2 cm) Weight: 78.5 kg (173 lb 1 oz) IBW/kg (Calculated) : 61.6  Temp (24hrs), Avg:98.4 F (36.9 C), Min:97.6 F (36.4 C), Max:98.9 F (37.2 C)  Recent Labs  Lab 07/29/21 1100 07/30/21 1003 07/30/21 1558 07/31/21 0357  WBC 11.2* 9.0 9.0 6.6  CREATININE 1.51* 2.66* 2.23* 1.98*  LATICACIDVEN 1.7 2.6* 1.3  --      Estimated Creatinine Clearance: 23.7 mL/min (A) (by C-G formula based on SCr of 1.98 mg/dL (H)).    Allergies  Allergen Reactions   Lisinopril     Renal insufficiency    Antimicrobials this admission: Vanc 8/11>> CTX x 1 8/11  Dose adjustments this admission:  Microbiology results: 8/10 Blood - 2/4 [facility]>>staph species on BCID Staph hominis 8/11 BCx: 1 bottle [facility]  Thank you for allowing pharmacy to be a part of this patient's care.  Peggyann Juba, PharmD, BCPS Pharmacy: [phone] 07/31/2021 11:42 AM

## 2021-07-31 NOTE — Progress Notes (Addendum)
PROGRESS NOTE    Sherry Perez  [id number] DOB: 1941-06-17 DOA: 07/30/2021 PCP: [name], NP     Brief Narrative:  Sherry Perez is a 80 y.o. female with medical history significant for advanced dementia, hypertension, hyperlipidemia, controlled diabetes who was seen in the ED on 8/10 with multiple complaints, not feeling well, headache, myalgia, diarrhea, had work-up in the ED that was not impressive blood cultures were drawn and patient was discharged home.  Then 2 out of 4 blood cultures tested positive for Staphylococcus was called back to the ED and transferred to [facility] for admission.  New events last 24 hours / Subjective: Patient tells me that she is feeling well, ready to go home.  Daughter at bedside states that patient has been complaining of some headaches still.  Patient herself denies any fevers or chills, chest pain or shortness of breath, no nausea, vomiting, abdominal pain or dysuria.  She denies having any open skin or wounds.   Assessment & Plan:   Principal Problem:   Bacterial infection due to Staphylococcus Active Problems:   Diabetes type 2, controlled ([place])   HTN (hypertension)   Hyperlipidemia   Dementia (HCC)   Bacteremia   AKI (acute kidney injury) ([place])   Staph bacteremia -Blood culture obtained 8/10 showing staph hominis -Blood culture obtained 8/11 showing gram-positive cocci -Continue vancomycin -Infectious disease consulted  AKI on CKD stage IIIb -Baseline creatinine around 1.4-1.5 -Renal US consistent with medical renal disease -Creatinine improved overnight, continue IV fluid -Hold Lasix  Hypertension -Continue hydralazine, metoprolol, Norvasc  Hyperlipidemia -Continue Lipitor  Advanced dementia -Pleasantly demented, continue Namenda  DVT prophylaxis:  enoxaparin (LOVENOX) injection 30 mg Start: 07/30/21 2200 SCDs Start: 07/30/21 1721  Code Status:     Code Status Orders  (From admission, onward)            Start     Ordered   07/30/21 1721  Full code  Continuous        07/30/21 1722           Code Status History     This patient has a current code status but no historical code status.      Advance Directive Documentation    Flowsheet Row Most Recent Value  Type of Advance Directive Healthcare Power of Attorney, Living will  Pre-existing out of facility DNR order (yellow form or pink MOST form) --  "MOST" Form in Place? --      Family Communication: Daughter at bedside Disposition Plan:  Status is: Inpatient  Remains inpatient appropriate because:IV treatments appropriate due to intensity of illness or inability to take PO  Dispo: The patient is from: Home              Anticipated d/c is to: Home              Patient currently is not medically stable to d/c.   Difficult to place patient No      Consultants:  Infectious disease  Procedures:  None   Antimicrobials:  Anti-infectives (From admission, onward)    Start     Dose/Rate Route Frequency Ordered Stop   08/01/21 1000  vancomycin (VANCOCIN) IVPB 1000 mg/200 mL premix        1,000 mg 200 mL/hr over 60 Minutes Intravenous Every 48 hours 07/30/21 1133     07/30/21 1115  vancomycin (VANCOCIN) IVPB 1000 mg/200 mL premix        1,000 mg 200 mL/hr over 60 Minutes Intravenous  Once 07/30/21 1107 07/30/21 1404   07/30/21 1100  cefTRIAXone (ROCEPHIN) 2 g in sodium chloride 0.9 % 100 mL IVPB        2 g 200 mL/hr over 30 Minutes Intravenous  Once 07/30/21 1056 07/30/21 1215        Objective: Vitals:   07/30/21 1935 07/30/21 2348 07/31/21 0432 07/31/21 1004  BP: (!) 151/53 (!) 143/51 (!) 163/73 (!) 126/45  Pulse: 66 60 (!) 57 65  Resp: '16 16 16 18  '$ Temp: 98.9 F (37.2 C) 98.4 F (36.9 C) 97.6 F (36.4 C) 98.8 F (37.1 C)  TempSrc: Oral Oral Oral Oral  SpO2: 100% 96% 99% 97%  Weight:      Height:        Intake/Output Summary (Last 24 hours) at 07/31/2021 1052 Last data filed at 07/31/2021 0432 Gross  per 24 hour  Intake 880.12 ml  Output 300 ml  Net 580.12 ml   Filed Weights   07/30/21 1000  Weight: 78.5 kg    Examination: General exam: Appears calm and comfortable  Respiratory system: Clear to auscultation. Respiratory effort normal. No respiratory distress. No conversational dyspnea.  Cardiovascular system: S1 & S2 heard, RRR. + murmur. No pedal edema. Gastrointestinal system: Abdomen is nondistended, soft and nontender. Normal bowel sounds heard. Central nervous system: Alert  Extremities: Symmetric in appearance  Skin: No rashes, lesions or ulcers on exposed skin  Psychiatry: Pleasantly confused but able to answer most questions   Data Reviewed: I have personally reviewed following labs and imaging studies  CBC: Recent Labs  Lab 07/29/21 1100 07/30/21 1003 07/30/21 1558 07/31/21 0357  WBC 11.2* 9.0 9.0 6.6  NEUTROABS 10.3* 7.6  --   --   HGB 12.7 12.8 12.2 11.2*  HCT 37.1 37.4 37.7 33.8*  MCV 77.3* 77.9* 80.2 79.5*  PLT 305 306 271 [id number]   Basic Metabolic Panel: Recent Labs  Lab 07/29/21 1100 07/30/21 1003 07/30/21 1558 07/31/21 0357  NA 138 139  --  141  K 3.7 3.2*  --  3.6  CL 104 100  --  110  CO2 21* 23  --  22  GLUCOSE 122* 99  --  98  BUN 22 28*  --  23  CREATININE 1.51* 2.66* 2.23* 1.98*  CALCIUM 9.2 9.7  --  8.6*   GFR: Estimated Creatinine Clearance: 23.7 mL/min (A) (by C-G formula based on SCr of 1.98 mg/dL (H)). Liver Function Tests: Recent Labs  Lab 07/29/21 1100 07/30/21 1003 07/31/21 0357  AST '22 23 22  '$ ALT '11 10 11  '$ ALKPHOS 56 57 45  BILITOT 0.9 1.0 0.7  PROT 8.1 7.9 6.9  ALBUMIN 4.2 4.3 3.5   No results for input(s): LIPASE, AMYLASE in the last 168 hours. Recent Labs  Lab 07/29/21 1100  AMMONIA <9*   Coagulation Profile: Recent Labs  Lab 07/31/21 0357  INR 1.3*   Cardiac Enzymes: No results for input(s): CKTOTAL, CKMB, CKMBINDEX, TROPONINI in the last 168 hours. BNP (last 3 results) No results for input(s): PROBNP  in the last 8760 hours. HbA1C: No results for input(s): HGBA1C in the last 72 hours. CBG: Recent Labs  Lab 07/29/21 1007  GLUCAP 148*   Lipid Profile: No results for input(s): CHOL, HDL, LDLCALC, TRIG, CHOLHDL, LDLDIRECT in the last 72 hours. Thyroid Function Tests: No results for input(s): TSH, T4TOTAL, FREET4, T3FREE, THYROIDAB in the last 72 hours. Anemia Panel: No results for input(s): VITAMINB12, FOLATE, FERRITIN, TIBC, IRON, RETICCTPCT in the last 72  hours. Sepsis Labs: Recent Labs  Lab 07/29/21 1100 07/30/21 1003 07/30/21 1558 07/31/21 0357  PROCALCITON  --   --  0.21 0.19  LATICACIDVEN 1.7 2.6* 1.3  --     Recent Results (from the past 240 hour(s))  Blood Cultures (routine x 2)     Status: None (Preliminary result)   Collection Time: 07/29/21 11:10 AM   Specimen: BLOOD LEFT HAND  Result Value Ref Range Status   Specimen Description   Final    BLOOD LEFT HAND Performed at [facility], [address] [place], [place] [address]    Special Requests   Final    BOTTLES DRAWN AEROBIC AND ANAEROBIC Blood Culture results may not be optimal due to an inadequate volume of blood received in culture bottles Performed at [facility], [address] [place], [place] [address]    Culture   Final    NO GROWTH < 24 HOURS Performed at [facility], [place] [address] [place], [place] [address]    Report Status PENDING  Incomplete  Blood Cultures (routine x 2)     Status: Abnormal (Preliminary result)   Collection Time: 07/29/21 11:20 AM   Specimen: Right Antecubital; Blood  Result Value Ref Range Status   Specimen Description   Final    RIGHT ANTECUBITAL Performed at [facility], [address] [place], [place] [address]    Special Requests   Final    BOTTLES DRAWN AEROBIC AND ANAEROBIC Blood Culture adequate volume Performed at [facility], [address] [place], [place] [address]    Culture  Setup Time   Final     GRAM POSITIVE COCCI IN CLUSTERS IN BOTH AEROBIC AND ANAEROBIC BOTTLES CRITICAL RESULT CALLED TO, READ BACK BY AND VERIFIED WITH: RN Nelida Gores [phone] FCP Performed at [facility], [place] [address], [place], [place] [address]    Culture STAPHYLOCOCCUS HOMINIS (A)  Final   Report Status PENDING  Incomplete  Blood Culture ID Panel (Reflexed)     Status: Abnormal   Collection Time: 07/29/21 11:20 AM  Result Value Ref Range Status   Enterococcus faecalis NOT DETECTED NOT DETECTED Final   Enterococcus Faecium NOT DETECTED NOT DETECTED Final   Listeria monocytogenes NOT DETECTED NOT DETECTED Final   Staphylococcus species DETECTED (A) NOT DETECTED Final    Comment: CRITICAL RESULT CALLED TO, READ BACK BY AND VERIFIED WITH: RN JAMIE B. 0845 [id number] FCP    Staphylococcus aureus (BCID) NOT DETECTED NOT DETECTED Final   Staphylococcus epidermidis NOT DETECTED NOT DETECTED Final   Staphylococcus lugdunensis NOT DETECTED NOT DETECTED Final   Streptococcus species NOT DETECTED NOT DETECTED Final   Streptococcus agalactiae NOT DETECTED NOT DETECTED Final   Streptococcus pneumoniae NOT DETECTED NOT DETECTED Final   Streptococcus pyogenes NOT DETECTED NOT DETECTED Final   A.calcoaceticus-baumannii NOT DETECTED NOT DETECTED Final   Bacteroides fragilis NOT DETECTED NOT DETECTED Final   Enterobacterales NOT DETECTED NOT DETECTED Final   Enterobacter cloacae complex NOT DETECTED NOT DETECTED Final   Escherichia coli NOT DETECTED NOT DETECTED Final   Klebsiella aerogenes NOT DETECTED NOT DETECTED Final   Klebsiella oxytoca NOT DETECTED NOT DETECTED Final   Klebsiella pneumoniae NOT DETECTED NOT DETECTED Final   Proteus species NOT DETECTED NOT DETECTED Final   Salmonella species NOT DETECTED NOT DETECTED Final   Serratia marcescens NOT DETECTED NOT DETECTED Final   Haemophilus influenzae NOT DETECTED  NOT DETECTED Final   Neisseria meningitidis NOT DETECTED NOT DETECTED Final   Pseudomonas  aeruginosa NOT DETECTED NOT DETECTED Final   Stenotrophomonas maltophilia NOT DETECTED NOT DETECTED Final   Candida albicans NOT DETECTED NOT DETECTED Final   Candida auris NOT DETECTED NOT DETECTED Final   Candida glabrata NOT DETECTED NOT DETECTED Final   Candida krusei NOT DETECTED NOT DETECTED Final   Candida parapsilosis NOT DETECTED NOT DETECTED Final   Candida tropicalis NOT DETECTED NOT DETECTED Final   Cryptococcus neoformans/gattii NOT DETECTED NOT DETECTED Final    Comment: Performed at [facility], [place] [address], [place], [place] [address]  Resp Panel by RT-PCR (Flu A&B, Covid) Nasopharyngeal Swab     Status: None   Collection Time: 07/29/21 11:55 AM   Specimen: Nasopharyngeal Swab; Nasopharyngeal(NP) swabs in vial transport medium  Result Value Ref Range Status   SARS Coronavirus 2 by RT PCR NEGATIVE NEGATIVE Final    Comment: (NOTE) SARS-CoV-2 target nucleic acids are NOT DETECTED.  The SARS-CoV-2 RNA is generally detectable in upper respiratory specimens during the acute phase of infection. The lowest concentration of SARS-CoV-2 viral copies this assay can detect is 138 copies/mL. A negative result does not preclude SARS-Cov-2 infection and should not be used as the sole basis for treatment or other patient management decisions. A negative result may occur with  improper specimen collection/handling, submission of specimen other than nasopharyngeal swab, presence of viral mutation(s) within the areas targeted by this assay, and inadequate number of viral copies(<138 copies/mL). A negative result must be combined with clinical observations, patient history, and epidemiological information. The expected result is Negative.  Fact Sheet for Patients:  EntrepreneurPulse.com.au  Fact Sheet for Healthcare Providers:  IncredibleEmployment.be  This test is no t yet approved or cleared by the [place] FDA and  has been  authorized for detection and/or diagnosis of SARS-CoV-2 by FDA under an Emergency Use Authorization (EUA). This EUA will remain  in effect (meaning this test can be used) for the duration of the COVID-19 declaration under Section 564(b)(1) of the Act, 21 U.S.C.section 360bbb-3(b)(1), unless the authorization is terminated  or revoked sooner.       Influenza A by PCR NEGATIVE NEGATIVE Final   Influenza B by PCR NEGATIVE NEGATIVE Final    Comment: (NOTE) The Xpert Xpress SARS-CoV-2/FLU/RSV plus assay is intended as an aid in the diagnosis of influenza from Nasopharyngeal swab specimens and should not be used as a sole basis for treatment. Nasal washings and aspirates are unacceptable for Xpert Xpress SARS-CoV-2/FLU/RSV testing.  Fact Sheet for Patients: EntrepreneurPulse.com.au  Fact Sheet for Healthcare Providers: IncredibleEmployment.be  This test is not yet approved or cleared by the [place] FDA and has been authorized for detection and/or diagnosis of SARS-CoV-2 by FDA under an Emergency Use Authorization (EUA). This EUA will remain in effect (meaning this test can be used) for the duration of the COVID-19 declaration under Section 564(b)(1) of the Act, 21 U.S.C. section 360bbb-3(b)(1), unless the authorization is terminated or revoked.  Performed at [facility], [address] [place], [place] [address]   Blood culture (routine x 2)     Status: None (Preliminary result)   Collection Time: 07/30/21 11:12 AM   Specimen: BLOOD  Result Value Ref Range Status   Specimen Description   Final    BLOOD LEFT ANTECUBITAL Performed at [facility], [place] [address] [place], [place] [address]    Special Requests  Final    BOTTLES DRAWN AEROBIC AND ANAEROBIC Blood Culture adequate volume Performed at [facility], [address] [place], [place] [address]    Culture  Setup Time   Final    GRAM POSITIVE  COCCI AEROBIC BOTTLE ONLY CRITICAL VALUE NOTED.  VALUE IS CONSISTENT WITH PREVIOUSLY REPORTED AND CALLED VALUE. Performed at [facility], [place] [address] [place], [place] [address]    Culture GRAM POSITIVE COCCI  Final   Report Status PENDING  Incomplete      Radiology Studies: DG Pelvis 1-2 Views  Result Date: 07/29/2021 CLINICAL DATA:  Right lower extremity pain.  No known injury. EXAM: PELVIS - 1-2 VIEW COMPARISON:  None. FINDINGS: There is no evidence of pelvic fracture or diastasis. No pelvic bone lesions are seen. IMPRESSION: Negative. Electronically Signed   By: Marijo Conception M.D.   On: 07/29/2021 14:28   CT HEAD WO CONTRAST  Result Date: 07/29/2021 CLINICAL DATA:  Trauma EXAM: CT HEAD WITHOUT CONTRAST TECHNIQUE: Contiguous axial images were obtained from the base of the skull through the vertex without intravenous contrast. COMPARISON:  Head CT 02/25/2020 FINDINGS: Brain: There is no evidence of acute intracranial hemorrhage, extra-axial fluid collection, or infarct. Foci of hypodensity in the subcortical and periventricular white matter are nonspecific but likely reflect sequela of chronic white matter microangiopathy, not significantly changed. Mild parenchymal volume loss is unchanged. The ventricles are stable in size. There is no midline shift. No mass lesion is identified. Vascular: There is calcification of the bilateral cavernous ICAs. Skull: There is minimal stranding in the left parietal scalp without underlying calvarial fracture. There is no suspicious osseous lesion. Sinuses/Orbits: The imaged paranasal sinuses are clear. The mastoid air cells are clear. A left lens implant is noted. The globes and orbits are otherwise unremarkable. Other: There is degenerative change of the left temporomandibular joint. IMPRESSION: 1. No acute intracranial hemorrhage or calvarial fracture. 2. Stable mild parenchymal volume loss and chronic white matter microangiopathy. Electronically  Signed   By: Valetta Mole MD   On: 07/29/2021 11:17   CT ABDOMEN PELVIS W CONTRAST  Result Date: 07/29/2021 CLINICAL DATA:  Abdominal distension. EXAM: CT ABDOMEN AND PELVIS WITH CONTRAST TECHNIQUE: Multidetector CT imaging of the abdomen and pelvis was performed using the standard protocol following bolus administration of intravenous contrast. CONTRAST:  50m OMNIPAQUE IOHEXOL 300 MG/ML  SOLN COMPARISON:  July 03, 2021. FINDINGS: Lower chest: No acute abnormality. Hepatobiliary: No cholelithiasis or biliary dilatation is noted. Stable probable hepatic cysts are noted. Pancreas: Unremarkable. No pancreatic ductal dilatation or surrounding inflammatory changes. Spleen: Normal in size without focal abnormality. Adrenals/Urinary Tract: Adrenal glands appear normal. Stable bilateral renal cortical scarring is noted. No hydronephrosis or renal obstruction is noted. No renal or ureteral calculi are noted. Mild urinary bladder distention is noted. Stomach/Bowel: Stomach is within normal limits. Appendix appears normal. No evidence of bowel wall thickening, distention, or inflammatory changes. Vascular/Lymphatic: Aortic atherosclerosis. No enlarged abdominal or pelvic lymph nodes. Reproductive: Status post hysterectomy. No adnexal masses. Other: No abdominal wall hernia or abnormality. No abdominopelvic ascites. Musculoskeletal: No acute or significant osseous findings. IMPRESSION: Stable hepatic cysts. Stable bilateral renal cortical scarring. No acute abnormality seen in the abdomen or pelvis. Aortic Atherosclerosis (ICD10-I70.0). Electronically Signed   By: JMarijo ConceptionM.D.   On: 07/29/2021 16:17   [place]  Result Date: 07/30/2021 CLINICAL DATA:  Acute kidney injury EXAM: RENAL / URINARY TRACT ULTRASOUND COMPLETE COMPARISON:  CT  07/29/2021 FINDINGS: Right Kidney: Renal measurements: 8.3 x 3.6 x 3.5 cm = volume: 54 mL. Cortex is echogenic. No mass or hydronephrosis. Left Kidney: Renal measurements: 8 x 5.3 x  3.7 cm = volume: 82.2 mL. Cortex is echogenic. No mass or hydronephrosis. Bladder: Appears normal for degree of bladder distention. Other: None. IMPRESSION: Atrophic echogenic kidneys bilaterally consistent with medical renal disease. No hydronephrosis Electronically Signed   By: Donavan Foil M.D.   On: 07/30/2021 20:29   DG Chest Port 1 View  Result Date: 07/29/2021 CLINICAL DATA:  The altered Mental status EXAM: PORTABLE CHEST 1 VIEW COMPARISON:  Chest radiograph 04/08/2021 FINDINGS: The cardiomediastinal silhouette is stable. Calcified atherosclerotic plaque is again noted at the aortic arch. The lungs are clear, with no focal consolidation or pulmonary edema. There is no pleural effusion or pneumothorax. A small density in the right apex may reflect a calcified granuloma. The bones are unremarkable. IMPRESSION: No radiographic evidence of acute cardiopulmonary process. Electronically Signed   By: Valetta Mole MD   On: 07/29/2021 11:20   DG FEMUR, MIN 2 VIEWS RIGHT  Result Date: 07/29/2021 CLINICAL DATA:  Right lower extremity pain without known injury. EXAM: RIGHT FEMUR 2 VIEWS COMPARISON:  None. FINDINGS: There is no evidence of fracture or other focal bone lesions. Soft tissues are unremarkable. IMPRESSION: Negative. Electronically Signed   By: Marijo Conception M.D.   On: 07/29/2021 14:30      Scheduled Meds:  atorvastatin  80 mg Oral Daily   cholecalciferol  1,000 Units Oral Daily   enoxaparin (LOVENOX) injection  30 mg Subcutaneous Q24H   hydrALAZINE  25 mg Oral TID   memantine  10 mg Oral QHS   metoprolol succinate  50 mg Oral Daily   sodium chloride flush  3 mL Intravenous Q12H   Continuous Infusions:  sodium chloride 100 mL/hr at 07/31/21 0951   [START ON 08/01/2021] vancomycin       LOS: 1 day      Time spent: 30 minutes   Dessa Phi, DO [facility] 07/31/2021, 10:52 AM   Available via Epic secure chat 7am-7pm After these hours, please refer to coverage  provider listed on [email or web address]

## 2021-07-31 NOTE — Consult Note (Signed)
[facility] for Infectious Disease    Date of Admission:  07/30/2021     Reason for Consult:Staph hominis bacteremia     Referring Physician: Dr Maylene Roes  Current antibiotics: Vancomycin    ASSESSMENT:    Staph hominis positive blood cultures:  8/10 cultures positive in 1 of 2 sets (2 of 4 bottles) for staph hominis.  8/11 cultures also positive in 1 of 2 sets (1 of 4 bottles) for GPC pending further ID.  No other signs/symptoms of infection at this time and no obvious risk factors for high grade CoNS bacteremia AKI on CKD: Creatinine improved today to 1.98 Dementia  PLAN:    Follow up 8/10 susceptibilities Follow up 8/11 cultures ID and susceptibilities If 2 separate isolates then would consider contaminant Repeat blood cx in the morning If 8/10 and 8/11 are the same would recommend TTE to start Continue vancomycin per pharmacy with close monitoring of creatinine Available as needed over the weekend.  Dr Gale Journey back on Monday   Principal Problem:   Bacterial infection due to Staphylococcus Active Problems:   Diabetes type 2, controlled ([place])   HTN (hypertension)   Hyperlipidemia   Dementia ([place])   Bacteremia   AKI (acute kidney injury) ([place])   MEDICATIONS:    Scheduled Meds: . amLODipine  10 mg Oral Daily  . atorvastatin  80 mg Oral Daily  . cholecalciferol  1,000 Units Oral Daily  . enoxaparin (LOVENOX) injection  30 mg Subcutaneous Q24H  . hydrALAZINE  25 mg Oral TID  . memantine  10 mg Oral QHS  . metoprolol succinate  50 mg Oral Daily  . sodium chloride flush  3 mL Intravenous Q12H   Continuous Infusions: . sodium chloride 100 mL/hr at 07/31/21 0951  . [START ON 08/01/2021] vancomycin     PRN Meds:.acetaminophen **OR** acetaminophen, ondansetron **OR** ondansetron (ZOFRAN) IV, senna-docusate, traMADol  HPI:    Sherry Perez is a 80 y.o. female with history of dementia, hypertension, hyperlipidemia, diabetes who initially presented to the emergency  department 07/29/2021 with complaint of headache and leg ache.  Work-up in the ED was unremarkable and she was subsequently discharged home.  She did have blood cultures drawn prior to her discharge on 8/10 which were notable for staph hominis in 2 out of 4 bottles.  She returned to the ER yesterday after being notified of the positive blood culture results at which time she continued to complain of frontal headaches and some myalgias.  She was started on antibiotics with vancomycin and given a dose of ceftriaxone.  Repeat blood cultures were obtained.  This is again notable for gram-positive cocci in 1 out of 4 bottles currently.  Patient has been afebrile since admission.  In the ER she had a leukocytosis of 11.2 which is down trended to 6.6 this morning.  She is currently being given vancomycin dosed per pharmacy and we have been consulted for further recommendations.  Upon evaluation she is in no acute distress with her daughter in the room.  Due to her dementia her daughter provides much of the history.  Patient has no history of prosthetic heart valves, implanted cardiac devices, indwelling central lines, or prosthetic joints.  She currently is feeling better and wants to go home.   Past Medical History:  Diagnosis Date  . Diabetes mellitus without complication ([place])   . HTN (hypertension)   . Hyperlipidemia     Social History   Tobacco Use  . Smoking status: Never  .  Smokeless tobacco: Never  Vaping Use  . Vaping Use: Never used  Substance Use Topics  . Alcohol use: No    Alcohol/week: 0.0 standard drinks  . Drug use: Never    Family History  Problem Relation Age of Onset  . Diabetes Mother   . Diabetes Brother     Allergies  Allergen Reactions  . Lisinopril     Renal insufficiency    Review of Systems  Constitutional:  Negative for chills and fever.  Respiratory: Negative.    Cardiovascular: Negative.   Gastrointestinal: Negative.   Genitourinary: Negative.    Musculoskeletal:  Positive for myalgias. Negative for back pain and neck pain.  Skin: Negative.   Neurological:  Positive for headaches.  All other systems reviewed and are negative.  OBJECTIVE:   Blood pressure (!) 154/52, pulse (!) 57, temperature 98.7 F (37.1 C), temperature source Oral, resp. rate 16, height '5\' 7"'$  (1.702 m), weight 78.5 kg, SpO2 98 %. Body mass index is 27.11 kg/m.  Physical Exam Constitutional:      General: She is not in acute distress.    Appearance: Normal appearance.  HENT:     Head: Normocephalic and atraumatic.  Eyes:     General: No scleral icterus.    Extraocular Movements: Extraocular movements intact.     Conjunctiva/sclera: Conjunctivae normal.  Cardiovascular:     Rate and Rhythm: Normal rate and regular rhythm.     Heart sounds: No murmur heard. Pulmonary:     Effort: Pulmonary effort is normal. No respiratory distress.     Breath sounds: Normal breath sounds.  Abdominal:     General: There is no distension.     Palpations: Abdomen is soft.     Tenderness: There is no abdominal tenderness.  Musculoskeletal:        General: Normal range of motion.     Cervical back: Normal range of motion and neck supple. No rigidity.  Skin:    General: Skin is warm and dry.     Findings: No rash.  Neurological:     General: No focal deficit present.     Mental Status: She is alert. Mental status is at baseline.  Psychiatric:        Mood and Affect: Mood normal.        Behavior: Behavior normal.     Lab Results: Lab Results  Component Value Date   WBC 6.6 07/31/2021   HGB 11.2 (L) 07/31/2021   HCT 33.8 (L) 07/31/2021   MCV 79.5 (L) 07/31/2021   PLT 267 07/31/2021    Lab Results  Component Value Date   NA 141 07/31/2021   K 3.6 07/31/2021   CO2 22 07/31/2021   GLUCOSE 98 07/31/2021   BUN 23 07/31/2021   CREATININE 1.98 (H) 07/31/2021   CALCIUM 8.6 (L) 07/31/2021   GFRNONAA 25 (L) 07/31/2021    Lab Results  Component Value Date    ALT 11 07/31/2021   AST 22 07/31/2021   ALKPHOS 45 07/31/2021   BILITOT 0.7 07/31/2021    No results found for: CRP  No results found for: ESRSEDRATE  I have reviewed the micro and lab results in Epic.  Imaging: CT ABDOMEN PELVIS W CONTRAST  Result Date: 07/29/2021 CLINICAL DATA:  Abdominal distension. EXAM: CT ABDOMEN AND PELVIS WITH CONTRAST TECHNIQUE: Multidetector CT imaging of the abdomen and pelvis was performed using the standard protocol following bolus administration of intravenous contrast. CONTRAST:  92m OMNIPAQUE IOHEXOL 300 MG/ML  SOLN COMPARISON:  July 03, 2021. FINDINGS: Lower chest: No acute abnormality. Hepatobiliary: No cholelithiasis or biliary dilatation is noted. Stable probable hepatic cysts are noted. Pancreas: Unremarkable. No pancreatic ductal dilatation or surrounding inflammatory changes. Spleen: Normal in size without focal abnormality. Adrenals/Urinary Tract: Adrenal glands appear normal. Stable bilateral renal cortical scarring is noted. No hydronephrosis or renal obstruction is noted. No renal or ureteral calculi are noted. Mild urinary bladder distention is noted. Stomach/Bowel: Stomach is within normal limits. Appendix appears normal. No evidence of bowel wall thickening, distention, or inflammatory changes. Vascular/Lymphatic: Aortic atherosclerosis. No enlarged abdominal or pelvic lymph nodes. Reproductive: Status post hysterectomy. No adnexal masses. Other: No abdominal wall hernia or abnormality. No abdominopelvic ascites. Musculoskeletal: No acute or significant osseous findings. IMPRESSION: Stable hepatic cysts. Stable bilateral renal cortical scarring. No acute abnormality seen in the abdomen or pelvis. Aortic Atherosclerosis (ICD10-I70.0). Electronically Signed   By: Marijo Conception M.D.   On: 07/29/2021 16:17   US RENAL  Result Date: 07/30/2021 CLINICAL DATA:  Acute kidney injury EXAM: RENAL / URINARY TRACT ULTRASOUND COMPLETE COMPARISON:  CT 07/29/2021  FINDINGS: Right Kidney: Renal measurements: 8.3 x 3.6 x 3.5 cm = volume: 54 mL. Cortex is echogenic. No mass or hydronephrosis. Left Kidney: Renal measurements: 8 x 5.3 x 3.7 cm = volume: 82.2 mL. Cortex is echogenic. No mass or hydronephrosis. Bladder: Appears normal for degree of bladder distention. Other: None. IMPRESSION: Atrophic echogenic kidneys bilaterally consistent with medical renal disease. No hydronephrosis Electronically Signed   By: Donavan Foil M.D.   On: 07/30/2021 20:29     Imaging independently reviewed in Epic.  [facility] [phone] pager 07/31/2021, 2:56 PM

## 2021-07-31 NOTE — Progress Notes (Signed)
  Echocardiogram 2D Echocardiogram has been performed.  Sherry Perez 07/31/2021, 3:57 PM

## 2021-08-01 LAB — CBC
HCT: 34.6 % — ABNORMAL LOW (ref 36.0–46.0)
Hemoglobin: 11.2 g/dL — ABNORMAL LOW (ref 12.0–15.0)
MCH: 26.1 pg (ref 26.0–34.0)
MCHC: 32.4 g/dL (ref 30.0–36.0)
MCV: 80.7 fL (ref 80.0–100.0)
Platelets: 265 10*3/uL (ref 150–400)
RBC: 4.29 MIL/uL (ref 3.87–5.11)
RDW: 15.6 % — ABNORMAL HIGH (ref 11.5–15.5)
WBC: 6.4 10*3/uL (ref 4.0–10.5)
nRBC: 0 % (ref 0.0–0.2)

## 2021-08-01 LAB — BASIC METABOLIC PANEL
Anion gap: 9 (ref 5–15)
BUN: 12 mg/dL (ref 8–23)
CO2: 21 mmol/L — ABNORMAL LOW (ref 22–32)
Calcium: 8.3 mg/dL — ABNORMAL LOW (ref 8.9–10.3)
Chloride: 110 mmol/L (ref 98–111)
Creatinine, Ser: 1.34 mg/dL — ABNORMAL HIGH (ref 0.44–1.00)
GFR, Estimated: 40 mL/min — ABNORMAL LOW (ref >60)
Glucose, Bld: 90 mg/dL (ref 70–99)
Potassium: 3.5 mmol/L (ref 3.5–5.1)
Sodium: 140 mmol/L (ref 135–145)

## 2021-08-01 LAB — PROCALCITONIN: Procalcitonin: <0.1 ng/mL

## 2021-08-01 MED ORDER — METOPROLOL SUCCINATE ER 50 MG PO TB24
100.0000 mg | ORAL_TABLET | Freq: Every day | ORAL | Status: DC
  Administered 2021-08-02 – 2021-08-04 (×3): 100 mg via ORAL
  Filled 2021-08-01 (×3): qty 2

## 2021-08-01 MED ORDER — POLYETHYLENE GLYCOL 3350 17 G PO PACK
17.0000 g | PACK | Freq: Every day | ORAL | Status: DC | PRN
  Administered 2021-08-02: 17 g via ORAL
  Filled 2021-08-01: qty 1

## 2021-08-01 MED ORDER — HYDRALAZINE HCL 20 MG/ML IJ SOLN
5.0000 mg | INTRAMUSCULAR | Status: DC | PRN
  Administered 2021-08-01 – 2021-08-03 (×3): 5 mg via INTRAVENOUS
  Filled 2021-08-01 (×3): qty 1

## 2021-08-01 MED ORDER — FUROSEMIDE 40 MG PO TABS
40.0000 mg | ORAL_TABLET | Freq: Every day | ORAL | Status: DC
  Administered 2021-08-01 – 2021-08-03 (×3): 40 mg via ORAL
  Filled 2021-08-01 (×3): qty 1

## 2021-08-01 MED ORDER — ENOXAPARIN SODIUM 40 MG/0.4ML IJ SOSY
40.0000 mg | PREFILLED_SYRINGE | INTRAMUSCULAR | Status: DC
  Administered 2021-08-01 – 2021-08-03 (×3): 40 mg via SUBCUTANEOUS
  Filled 2021-08-01 (×3): qty 0.4

## 2021-08-01 MED ORDER — VANCOMYCIN HCL 750 MG/150ML IV SOLN
750.0000 mg | INTRAVENOUS | Status: DC
  Administered 2021-08-02: 750 mg via INTRAVENOUS
  Filled 2021-08-01 (×2): qty 150

## 2021-08-01 MED ORDER — SENNOSIDES-DOCUSATE SODIUM 8.6-50 MG PO TABS: 1.0000 | ORAL_TABLET | Freq: Every evening | ORAL | Status: DC | PRN

## 2021-08-01 NOTE — Progress Notes (Addendum)
Pharmacy Antibiotic Note  Sherry Perez is a 80 y.o. female admitted on 07/30/2021 with bacteremia with Staph hominis.  Pharmacy has been consulted for vancomycin dosing.   Day #3 abx - Afebrile - WBC is WNL - PCT 0.21 >> 0.19>> 0.1  - AKI on CKD, SCr 1.98>>1.34  Plan: Adjust vancomycin to 750 mg IV q24h for estimated AUC ~ 435 using SCr 1.34 F/u renal fxn, C&S, clinical status and peak/trough at [facility] F/u finalized culture  Height: '5\' 7"'$  (170.2 cm) Weight: 78.5 kg (173 lb 1 oz) IBW/kg (Calculated) : 61.6  Temp (24hrs), Avg:98.7 F (37.1 C), Min:98.5 F (36.9 C), Max:98.9 F (37.2 C)  Recent Labs  Lab 07/29/21 1100 07/30/21 1003 07/30/21 1558 07/31/21 0357 08/01/21 0400  WBC 11.2* 9.0 9.0 6.6 6.4  CREATININE 1.51* 2.66* 2.23* 1.98* 1.34*  LATICACIDVEN 1.7 2.6* 1.3  --   --      Estimated Creatinine Clearance: 35 mL/min (A) (by C-G formula based on SCr of 1.34 mg/dL (H)).    Allergies  Allergen Reactions   Lisinopril     Renal insufficiency    Antimicrobials this admission: Vanc 8/11>> CTX x 1 8/11 Dose adjustments this admission: 8/12 V 1 gm q12>> V 1250 q48 8/13 V 1250 q48>> V 750 q24 Microbiology results: 8/10 Blood - 2/4 GPC>>staph species on BCID Staph hominis 8/11 BCx: 1 bottle GPC > staph hominis 8/13 BCx2: sent  Thank you for allowing pharmacy to be a part of this patient's care.  Eudelia Bunch, Pharm.D 08/01/2021 4:13 PM

## 2021-08-01 NOTE — Evaluation (Signed)
Occupational Therapy Evaluation Patient Details Name: Sherry Perez MRN: [id number] DOB: 1941-01-30 Today's Date: 08/01/2021    History of Present Illness Sherry Perez is a 80 y.o. female with medical history significant for advanced dementia, hypertension, hyperlipidemia, controlled diabetes who was seen in the ED on 8/10 with multiple complaints, not feeling well, headache, myalgia, diarrhea, had work-up in the ED that was not impressive blood cultures were drawn and patient was discharged home.  Then 2 out of 4 blood cultures tested positive for Staphylococcus was called back to the ED and transferred to [facility] for admission.   Clinical Impression   Patient is currently requiring assistance with ADLs including min guard assist with toileting, minimal assist with LE dressing, min guard to minimal assist with bathing, and setup/supervision assist with seated grooming and UE dressing, all of which is below patient's typical baseline of being supervision level with all ADLs and functional mobility without an AD.  During this evaluation, patient was limited by generalized weakness and baseline cognitive deficits, which has the potential to impact patient's safety and independence during functional mobility, as well as performance for ADLs. Young "6-clicks" Daily Activity Inpatient Short Form score of 19/24 this session. Patient lives with her family, who is able to provide 24/7 supervision and assistance.  Patient demonstrates good rehab potential, and should benefit from continued skilled occupational therapy services while in acute care to maximize safety, independence and quality of life at home.      Follow Up Recommendations  No OT follow up    Equipment Recommendations  Tub/shower seat    Recommendations for Other Services       Precautions / Restrictions Precautions Precautions: Fall Restrictions Weight Bearing Restrictions: No      Mobility Bed Mobility Overal  bed mobility: Modified Independent             General bed mobility comments: Increased time. Partially elevated HOB.    Transfers Overall transfer level: Needs assistance   Transfers: Sit to/from Stand;Stand Pivot Transfers Sit to Stand: Supervision Stand pivot transfers: Min guard       General transfer comment: In room ambulation with use of RW due to mild unsteadiness and Min guard for safety/steadying.    Balance Overall balance assessment: Mild deficits observed, not formally tested                                         ADL either performed or assessed with clinical judgement   ADL Overall ADL's : Needs assistance/impaired Eating/Feeding: Independent   Grooming: Supervision/safety;Sitting;Set up;Brushing hair;Cueing for sequencing Grooming Details (indicate cue type and reason): Cues to initiate from daughter Upper Body Bathing: Sitting;Set up;Supervision/ safety   Lower Body Bathing: Min guard;Sit to/from stand;Sitting/lateral leans   Upper Body Dressing : Set up;Sitting   Lower Body Dressing: Minimal assistance Lower Body Dressing Details (indicate cue type and reason): Pt able to doff and don socks at EOB with setup and increased time. Pt required Min As to don underwear over hips when standing. Toilet Transfer: Min guard;Ambulation;RW Toilet Transfer Details (indicate cue type and reason): Based on t/f to recliner. Toileting- Clothing Manipulation and Hygiene: Minimal assistance Toileting - Clothing Manipulation Details (indicate cue type and reason): Based on general assessment. Pt is using pure wick.     Functional mobility during ADLs: Min guard;Supervision/safety;Rolling walker;Cueing for sequencing General ADL Comments: Pt unused  to a RW and required verbal and tactile cues for walker management at times.     Vision Baseline Vision/History: Wears glasses Wears Glasses: At all times (glasses at home) Patient Visual Report: No  change from baseline       Perception     Praxis      Pertinent Vitals/Pain Pain Assessment: No/denies pain     Hand Dominance Right   Extremity/Trunk Assessment Upper Extremity Assessment Upper Extremity Assessment: Overall WFL for tasks assessed   Lower Extremity Assessment Lower Extremity Assessment: Defer to PT evaluation   Cervical / Trunk Assessment Cervical / Trunk Assessment:  (a little stooped)   Communication Communication Communication: No difficulties   Cognition Arousal/Alertness: Awake/alert Behavior During Therapy: WFL for tasks assessed/performed Overall Cognitive Status: Within Functional Limits for tasks assessed                                 General Comments: Baseline memory impairments. Pt pleasant and cooperative. Follows 1-2 step instructions well.   General Comments       Exercises     Shoulder Instructions      Home Living Family/patient expects to be discharged to:: Private residence Living Arrangements: Children;Other relatives (Pt lives with daughter who is primary caregiver with pt's son and grandson also living in the house.) Available Help at Discharge: Family;Available 24 hours/day Type of Home: House Home Access: Stairs to enter [employer] of Steps: 1 small step Entrance Stairs-Rails: None Home Layout: Two level     Bathroom Shower/Tub: Teacher, early years/pre: Standard     Home Equipment: Environmental consultant - 2 wheels          Prior Functioning/Environment Level of Independence: Independent        Comments: Per daughter and pt, pt is independent with all ambulation and basic ADLs at baseline. Pt wears a diaper brief "just in case" but is continent of bowel and bladder. Family performs all IADLs including cooking,cleaning, laundry, medication management and transportaton. Pt enjoys family and doign her crossowrd        OT Problem List: Decreased activity tolerance;Decreased safety  awareness;Decreased knowledge of use of DME or AE;Impaired balance (sitting and/or standing)      OT Treatment/Interventions: Self-care/ADL training;Therapeutic exercise;Therapeutic activities;DME and/or AE instruction;Patient/family education;Balance training    OT Goals(Current goals can be found in the care plan section) Acute Rehab OT Goals Patient Stated Goal: Take a shower OT Goal Formulation: With patient/family Time For Goal Achievement: 08/15/21 Potential to Achieve Goals: Good ADL Goals Pt Will Perform Grooming: standing;with supervision Pt Will Perform Upper Body Bathing: standing;with supervision Pt Will Perform Lower Body Bathing: sitting/lateral leans;sit to/from stand;with supervision Pt Will Perform Lower Body Dressing: with supervision;sit to/from stand Pt Will Transfer to Toilet: with modified independence;ambulating Pt Will Perform Toileting - Clothing Manipulation and hygiene: with modified independence;sitting/lateral leans;sit to/from stand  OT Frequency: Min 2X/week   Barriers to D/C:    None       Co-evaluation              AM-PAC OT "6 Clicks" Daily Activity     Outcome Measure Help from another person eating meals?: None Help from another person taking care of personal grooming?: A Little Help from another person toileting, which includes using toliet, bedpan, or urinal?: A Little Help from another person bathing (including washing, rinsing, drying)?: A Little Help from another person to put on  and taking off regular upper body clothing?: A Little Help from another person to put on and taking off regular lower body clothing?: A Little 6 Click Score: 19   End of Session Equipment Utilized During Treatment: Gait belt;Rolling walker Nurse Communication: Mobility status (Need for new bedding due to wet sheets)  Activity Tolerance: Patient tolerated treatment well Patient left: in chair;with call bell/phone within reach;with family/visitor  present;with nursing/sitter in room  OT Visit Diagnosis: Unsteadiness on feet (R26.81)                Time: 1416-1450 OT Time Calculation (min): 34 min Charges:  OT General Charges $OT Visit: 1 Visit OT Evaluation $OT Eval Low Complexity: 1 Low OT Treatments $Self Care/Home Management : 8-22 mins  Anderson [place], [place] Office: [phone] 08/01/2021  Julien Girt 08/01/2021, 3:21 PM

## 2021-08-01 NOTE — Plan of Care (Signed)
  Problem: Clinical Measurements: Goal: Diagnostic test results will improve Outcome: Progressing   Problem: Clinical Measurements: Goal: Respiratory complications will improve Outcome: Progressing   Problem: Coping: Goal: Level of anxiety will decrease Outcome: Progressing   Problem: Elimination: Goal: Will not experience complications related to bowel motility Outcome: Progressing

## 2021-08-01 NOTE — Progress Notes (Addendum)
Paged [facility] floor coverage at 0221 am due to pt BP pressure been high 192/60. Pt laying in bed asleep. No response received.

## 2021-08-01 NOTE — Progress Notes (Signed)
PROGRESS NOTE    Sherry Perez  [id number] DOB: 1941/10/08 DOA: 07/30/2021 PCP: [name], NP     Brief Narrative:  Sherry Perez is a 80 y.o. female with medical history significant for advanced dementia, hypertension, hyperlipidemia, controlled diabetes who was seen in the ED on 8/10 with multiple complaints, not feeling well, headache, myalgia, diarrhea, had work-up in the ED that was not impressive blood cultures were drawn and patient was discharged home.  Then 2 out of 4 blood cultures tested positive for Staphylococcus was called back to the ED and transferred to [facility] for admission.  Infectious disease was consulted.  Repeat cultures obtained.  New events last 24 hours / Subjective: Daughter is at bedside, patient without any new complaints, acute change overnight.  Afebrile.  Assessment & Plan:   Principal Problem:   Bacterial infection due to Staphylococcus Active Problems:   Diabetes type 2, controlled ([place])   HTN (hypertension)   Hyperlipidemia   Dementia (HCC)   Bacteremia   AKI (acute kidney injury) ([place])   Staph bacteremia -Blood culture obtained 8/10 showing staph hominis, susceptibility pending -Blood culture obtained 8/11 showing gram-positive cocci, identification pending -Repeat blood culture ordered 8/13 -Echocardiogram without vegetation seen -Continue vancomycin -Infectious disease following  AKI on CKD stage IIIb -Baseline creatinine around 1.4-1.5 -Renal US consistent with medical renal disease -Resolved  Hypertension -Continue hydralazine, metoprolol, Norvasc -Resume Lasix  Hyperlipidemia -Continue Lipitor  Advanced dementia -Pleasantly demented, continue Namenda  DVT prophylaxis:  enoxaparin (LOVENOX) injection 40 mg Start: 08/01/21 2200 SCDs Start: 07/30/21 1721  Code Status:     Code Status Orders  (From admission, onward)           Start     Ordered   07/30/21 1721  Full code  Continuous        07/30/21 1722            Code Status History     This patient has a current code status but no historical code status.      Advance Directive Documentation    Flowsheet Row Most Recent Value  Type of Advance Directive Healthcare Power of Attorney, Living will  Pre-existing out of facility DNR order (yellow form or pink MOST form) --  "MOST" Form in Place? --      Family Communication: Daughter at bedside Disposition Plan:  Status is: Inpatient  Remains inpatient appropriate because:IV treatments appropriate due to intensity of illness or inability to take PO  Dispo: The patient is from: Home              Anticipated d/c is to: Home              Patient currently is not medically stable to d/c.   Difficult to place patient No      Consultants:  Infectious disease  Procedures:  None   Antimicrobials:  Anti-infectives (From admission, onward)    Start     Dose/Rate Route Frequency Ordered Stop   08/01/21 1000  vancomycin (VANCOCIN) IVPB 1000 mg/200 mL premix  Status:  Discontinued        1,000 mg 200 mL/hr over 60 Minutes Intravenous Every 48 hours 07/30/21 1133 07/31/21 1120   08/01/21 1000  vancomycin (VANCOREADY) IVPB 1250 mg/250 mL        1,250 mg 166.7 mL/hr over 90 Minutes Intravenous Every 48 hours 07/31/21 1120     07/30/21 1115  vancomycin (VANCOCIN) IVPB 1000 mg/200 mL premix  1,000 mg 200 mL/hr over 60 Minutes Intravenous  Once 07/30/21 1107 07/30/21 1404   07/30/21 1100  cefTRIAXone (ROCEPHIN) 2 g in sodium chloride 0.9 % 100 mL IVPB        2 g 200 mL/hr over 30 Minutes Intravenous  Once 07/30/21 1056 07/30/21 1215        Objective: Vitals:   08/01/21 0200 08/01/21 0537 08/01/21 0753 08/01/21 0947  BP: (!) 192/60 (!) 195/63 (!) 195/32 (!) 153/48  Pulse: 67 78 67 64  Resp: '18 18 18 '$ (!) 22  Temp: 98.6 F (37 C)  98.5 F (36.9 C)   TempSrc: Oral  Oral   SpO2: 100% 100% 100% 99%  Weight:      Height:        Intake/Output Summary (Last 24  hours) at 08/01/2021 1152 Last data filed at 08/01/2021 1044 Gross per 24 hour  Intake 2983.45 ml  Output 800 ml  Net 2183.45 ml    Filed Weights   07/30/21 1000  Weight: 78.5 kg    Examination: General exam: Appears calm and comfortable  Respiratory system: Clear to auscultation. Respiratory effort normal. Cardiovascular system: S1 & S2 heard, RRR. No pedal edema. + Systolic murmur Gastrointestinal system: Abdomen is nondistended, soft and nontender. Normal bowel sounds heard. Central nervous system: Alert Extremities: Symmetric in appearance bilaterally  Skin: No rashes, lesions or ulcers on exposed skin  Psychiatry: Stable  Data Reviewed: I have personally reviewed following labs and imaging studies  CBC: Recent Labs  Lab 07/29/21 1100 07/30/21 1003 07/30/21 1558 07/31/21 0357 08/01/21 0400  WBC 11.2* 9.0 9.0 6.6 6.4  NEUTROABS 10.3* 7.6  --   --   --   HGB 12.7 12.8 12.2 11.2* 11.2*  HCT 37.1 37.4 37.7 33.8* 34.6*  MCV 77.3* 77.9* 80.2 79.5* 80.7  PLT 305 306 271 [id number]    Basic Metabolic Panel: Recent Labs  Lab 07/29/21 1100 07/30/21 1003 07/30/21 1558 07/31/21 0357 08/01/21 0400  NA 138 139  --  141 140  K 3.7 3.2*  --  3.6 3.5  CL 104 100  --  110 110  CO2 21* 23  --  22 21*  GLUCOSE 122* 99  --  98 90  BUN 22 28*  --  23 12  CREATININE 1.51* 2.66* 2.23* 1.98* 1.34*  CALCIUM 9.2 9.7  --  8.6* 8.3*    GFR: Estimated Creatinine Clearance: 35 mL/min (A) (by C-G formula based on SCr of 1.34 mg/dL (H)). Liver Function Tests: Recent Labs  Lab 07/29/21 1100 07/30/21 1003 07/31/21 0357  AST '22 23 22  '$ ALT '11 10 11  '$ ALKPHOS 56 57 45  BILITOT 0.9 1.0 0.7  PROT 8.1 7.9 6.9  ALBUMIN 4.2 4.3 3.5    No results for input(s): LIPASE, AMYLASE in the last 168 hours. Recent Labs  Lab 07/29/21 1100  AMMONIA <9*    Coagulation Profile: Recent Labs  Lab 07/31/21 0357  INR 1.3*    Cardiac Enzymes: No results for input(s): CKTOTAL, CKMB,  CKMBINDEX, TROPONINI in the last 168 hours. BNP (last 3 results) No results for input(s): PROBNP in the last 8760 hours. HbA1C: No results for input(s): HGBA1C in the last 72 hours. CBG: Recent Labs  Lab 07/29/21 1007  GLUCAP 148*    Lipid Profile: No results for input(s): CHOL, HDL, LDLCALC, TRIG, CHOLHDL, LDLDIRECT in the last 72 hours. Thyroid Function Tests: No results for input(s): TSH, T4TOTAL, FREET4, T3FREE, THYROIDAB in the last 72 hours.  Anemia Panel: No results for input(s): VITAMINB12, FOLATE, FERRITIN, TIBC, IRON, RETICCTPCT in the last 72 hours. Sepsis Labs: Recent Labs  Lab 07/29/21 1100 07/30/21 1003 07/30/21 1558 07/31/21 0357 08/01/21 0400  PROCALCITON  --   --  0.21 0.19 <0.10  LATICACIDVEN 1.7 2.6* 1.3  --   --      Recent Results (from the past 240 hour(s))  Blood Cultures (routine x 2)     Status: None (Preliminary result)   Collection Time: 07/29/21 11:10 AM   Specimen: BLOOD LEFT HAND  Result Value Ref Range Status   Specimen Description   Final    BLOOD LEFT HAND Performed at [facility], [address] [place], [place] [address]    Special Requests   Final    BOTTLES DRAWN AEROBIC AND ANAEROBIC Blood Culture results may not be optimal due to an inadequate volume of blood received in culture bottles Performed at [facility], [address] [place], [place] [address]    Culture   Final    NO GROWTH 3 DAYS Performed at [facility], [place] [address], [place], [place] [address]    Report Status PENDING  Incomplete  Blood Cultures (routine x 2)     Status: Abnormal (Preliminary result)   Collection Time: 07/29/21 11:20 AM   Specimen: Right Antecubital; Blood  Result Value Ref Range Status   Specimen Description   Final    RIGHT ANTECUBITAL Performed at [facility], [address] [place], [place] [address]    Special Requests   Final    BOTTLES DRAWN AEROBIC AND ANAEROBIC Blood Culture adequate  volume Performed at [facility], [address] [place], [place] [address]    Culture  Setup Time   Final    GRAM POSITIVE COCCI IN CLUSTERS IN BOTH AEROBIC AND ANAEROBIC BOTTLES CRITICAL RESULT CALLED TO, READ BACK BY AND VERIFIED WITH: RN Nelida Gores [phone] FCP Performed at [facility], [place] [address], [place], [place] [address]    Culture STAPHYLOCOCCUS HOMINIS (A)  Final   Report Status PENDING  Incomplete  Blood Culture ID Panel (Reflexed)     Status: Abnormal   Collection Time: 07/29/21 11:20 AM  Result Value Ref Range Status   Enterococcus faecalis NOT DETECTED NOT DETECTED Final   Enterococcus Faecium NOT DETECTED NOT DETECTED Final   Listeria monocytogenes NOT DETECTED NOT DETECTED Final   Staphylococcus species DETECTED (A) NOT DETECTED Final    Comment: CRITICAL RESULT CALLED TO, READ BACK BY AND VERIFIED WITH: RN JAMIE B. [phone] [id number] FCP    Staphylococcus aureus (BCID) NOT DETECTED NOT DETECTED Final   Staphylococcus epidermidis NOT DETECTED NOT DETECTED Final   Staphylococcus lugdunensis NOT DETECTED NOT DETECTED Final   Streptococcus species NOT DETECTED NOT DETECTED Final   Streptococcus agalactiae NOT DETECTED NOT DETECTED Final   Streptococcus pneumoniae NOT DETECTED NOT DETECTED Final   Streptococcus pyogenes NOT DETECTED NOT DETECTED Final   A.calcoaceticus-baumannii NOT DETECTED NOT DETECTED Final   Bacteroides fragilis NOT DETECTED NOT DETECTED Final   Enterobacterales NOT DETECTED NOT DETECTED Final   Enterobacter cloacae complex NOT DETECTED NOT DETECTED Final   Escherichia coli NOT DETECTED NOT DETECTED Final   Klebsiella aerogenes NOT DETECTED NOT DETECTED Final   Klebsiella oxytoca NOT DETECTED NOT DETECTED Final   Klebsiella pneumoniae NOT DETECTED NOT DETECTED Final   Proteus species NOT DETECTED NOT DETECTED Final  Salmonella species NOT DETECTED NOT DETECTED Final   Serratia marcescens NOT DETECTED NOT DETECTED Final    Haemophilus influenzae NOT DETECTED NOT DETECTED Final   Neisseria meningitidis NOT DETECTED NOT DETECTED Final   Pseudomonas aeruginosa NOT DETECTED NOT DETECTED Final   Stenotrophomonas maltophilia NOT DETECTED NOT DETECTED Final   Candida albicans NOT DETECTED NOT DETECTED Final   Candida auris NOT DETECTED NOT DETECTED Final   Candida glabrata NOT DETECTED NOT DETECTED Final   Candida krusei NOT DETECTED NOT DETECTED Final   Candida parapsilosis NOT DETECTED NOT DETECTED Final   Candida tropicalis NOT DETECTED NOT DETECTED Final   Cryptococcus neoformans/gattii NOT DETECTED NOT DETECTED Final    Comment: Performed at [facility], [place] [address] [place], [place] [address]  Resp Panel by RT-PCR (Flu A&B, Covid) Nasopharyngeal Swab     Status: None   Collection Time: 07/29/21 11:55 AM   Specimen: Nasopharyngeal Swab; Nasopharyngeal(NP) swabs in vial transport medium  Result Value Ref Range Status   SARS Coronavirus 2 by RT PCR NEGATIVE NEGATIVE Final    Comment: (NOTE) SARS-CoV-2 target nucleic acids are NOT DETECTED.  The SARS-CoV-2 RNA is generally detectable in upper respiratory specimens during the acute phase of infection. The lowest concentration of SARS-CoV-2 viral copies this assay can detect is 138 copies/mL. A negative result does not preclude SARS-Cov-2 infection and should not be used as the sole basis for treatment or other patient management decisions. A negative result may occur with  improper specimen collection/handling, submission of specimen other than nasopharyngeal swab, presence of viral mutation(s) within the areas targeted by this assay, and inadequate number of viral copies(<138 copies/mL). A negative result must be combined with clinical observations, patient history, and epidemiological information. The expected result is Negative.  Fact Sheet for Patients:  EntrepreneurPulse.com.au  Fact Sheet for Healthcare Providers:   IncredibleEmployment.be  This test is no t yet approved or cleared by the [place] FDA and  has been authorized for detection and/or diagnosis of SARS-CoV-2 by FDA under an Emergency Use Authorization (EUA). This EUA will remain  in effect (meaning this test can be used) for the duration of the COVID-19 declaration under Section 564(b)(1) of the Act, 21 U.S.C.section 360bbb-3(b)(1), unless the authorization is terminated  or revoked sooner.       Influenza A by PCR NEGATIVE NEGATIVE Final   Influenza B by PCR NEGATIVE NEGATIVE Final    Comment: (NOTE) The Xpert Xpress SARS-CoV-2/FLU/RSV plus assay is intended as an aid in the diagnosis of influenza from Nasopharyngeal swab specimens and should not be used as a sole basis for treatment. Nasal washings and aspirates are unacceptable for Xpert Xpress SARS-CoV-2/FLU/RSV testing.  Fact Sheet for Patients: EntrepreneurPulse.com.au  Fact Sheet for Healthcare Providers: IncredibleEmployment.be  This test is not yet approved or cleared by the [place] FDA and has been authorized for detection and/or diagnosis of SARS-CoV-2 by FDA under an Emergency Use Authorization (EUA). This EUA will remain in effect (meaning this test can be used) for the duration of the COVID-19 declaration under Section 564(b)(1) of the Act, 21 U.S.C. section 360bbb-3(b)(1), unless the authorization is terminated or revoked.  Performed at [facility], [address] [place], [place] [address]   Blood culture (routine x 2)     Status: None (Preliminary result)   Collection Time: 07/30/21 11:12 AM   Specimen: BLOOD  Result Value Ref Range Status   Specimen Description   Final  BLOOD LEFT ANTECUBITAL Performed at [facility], [place] [address], [place], [place] [address]    Special Requests   Final    BOTTLES DRAWN AEROBIC AND ANAEROBIC Blood Culture adequate volume Performed  at [facility], [address] [place], [place] [address]    Culture  Setup Time   Final    GRAM POSITIVE COCCI AEROBIC BOTTLE ONLY CRITICAL VALUE NOTED.  VALUE IS CONSISTENT WITH PREVIOUSLY REPORTED AND CALLED VALUE. Performed at [facility], [place] [address], [place], [place] [address]    Culture GRAM POSITIVE COCCI  Final   Report Status PENDING  Incomplete  Blood culture (routine x 2)     Status: None (Preliminary result)   Collection Time: 07/30/21  3:57 PM   Specimen: BLOOD RIGHT HAND  Result Value Ref Range Status   Specimen Description   Final    BLOOD RIGHT HAND Performed at [place] [address] [place], [place] [address]    Special Requests   Final    BOTTLES DRAWN AEROBIC ONLY Blood Culture results may not be optimal due to an inadequate volume of blood received in culture bottles Performed at [place] [address] [place], [place] [address]    Culture   Final    NO GROWTH 2 DAYS Performed at [place] [address] [place], [place] [address]    Report Status PENDING  Incomplete       Radiology Studies: US RENAL  Result Date: 07/30/2021 CLINICAL DATA:  Acute kidney injury EXAM: RENAL / URINARY TRACT ULTRASOUND COMPLETE COMPARISON:  CT 07/29/2021 FINDINGS: Right Kidney: Renal measurements: 8.3 x 3.6 x 3.5 cm = volume: 54 mL. Cortex is echogenic. No mass or hydronephrosis. Left Kidney: Renal measurements: 8 x 5.3 x 3.7 cm = volume: 82.2 mL. Cortex is echogenic. No mass or hydronephrosis. Bladder: Appears normal for degree of bladder distention. Other: None. IMPRESSION: Atrophic echogenic kidneys bilaterally consistent with medical renal disease. No hydronephrosis Electronically Signed   By: Donavan Foil M.D.   On: 07/30/2021 20:29   ECHOCARDIOGRAM COMPLETE  Result Date: 07/31/2021    ECHOCARDIOGRAM REPORT   Patient Name:   Sherry Perez Date of Exam: 07/31/2021 Medical Rec #:   [id number]       Height:       67.0 in Accession #:    [id number]      Weight:       173.1 lb Date of Birth:  1941/02/03        BSA:          1.902 m Patient Age:    20 years        BP:           126/45 mmHg Patient Gender: F               HR:           60 bpm. Exam Location:  Inpatient Procedure: 2D Echo, 3D Echo, Cardiac Doppler and Color Doppler Indications:    Bacteremia  History:        Patient has prior history of Echocardiogram examinations, most                 recent 02/21/2018. Signs/Symptoms:Altered Mental Status,                 Alzheimer's and Syncope; Risk Factors:Hypertension, Diabetes and  Dyslipidemia.  Sonographer:    Roseanna Rainbow RDCS Referring Phys: [id number] Ed[place] IMPRESSIONS  1. Left ventricular ejection fraction, by estimation, is 70 to 75%. The left ventricle has hyperdynamic function. The left ventricle has no regional wall motion abnormalities. Left ventricular diastolic parameters are consistent with Grade I diastolic dysfunction (impaired relaxation).  2. Right ventricular systolic function is normal. The right ventricular size is normal. There is mildly elevated pulmonary artery systolic pressure. The estimated right ventricular systolic pressure is 0000000 mmHg.  3. The mitral valve is normal in structure. Trivial mitral valve regurgitation. No evidence of mitral stenosis.  4. The aortic valve is tricuspid. There is mild calcification of the aortic valve. There is mild thickening of the aortic valve. Aortic valve regurgitation is not visualized. Mild to moderate aortic valve sclerosis/calcification is present, without any evidence of aortic stenosis.  5. The inferior vena cava is normal in size with greater than 50% respiratory variability, suggesting right atrial pressure of 3 mmHg. Conclusion(s)/Recommendation(s): No evidence of valvular vegetations on this transthoracic echocardiogram. Would recommend a transesophageal echocardiogram to exclude infective endocarditis if  clinically indicated. FINDINGS  Left Ventricle: Left ventricular ejection fraction, by estimation, is 70 to 75%. The left ventricle has hyperdynamic function. The left ventricle has no regional wall motion abnormalities. The left ventricular internal cavity size was normal in size. There is no left ventricular hypertrophy. Left ventricular diastolic parameters are consistent with Grade I diastolic dysfunction (impaired relaxation). Right Ventricle: The right ventricular size is normal. No increase in right ventricular wall thickness. Right ventricular systolic function is normal. There is mildly elevated pulmonary artery systolic pressure. The tricuspid regurgitant velocity is 2.71  m/s, and with an assumed right atrial pressure of 8 mmHg, the estimated right ventricular systolic pressure is 0000000 mmHg. Left Atrium: Left atrial size was normal in size. Right Atrium: Right atrial size was normal in size. Pericardium: There is no evidence of pericardial effusion. Mitral Valve: The mitral valve is normal in structure. Trivial mitral valve regurgitation. No evidence of mitral valve stenosis. Tricuspid Valve: The tricuspid valve is normal in structure. Tricuspid valve regurgitation is mild . No evidence of tricuspid stenosis. Aortic Valve: The aortic valve is tricuspid. There is mild calcification of the aortic valve. There is mild thickening of the aortic valve. Aortic valve regurgitation is not visualized. Mild to moderate aortic valve sclerosis/calcification is present, without any evidence of aortic stenosis. Aortic valve mean gradient measures 7.0 mmHg. Aortic valve peak gradient measures 12.2 mmHg. Aortic valve area, by VTI measures 1.85 cm. Pulmonic Valve: The pulmonic valve was normal in structure. Pulmonic valve regurgitation is not visualized. No evidence of pulmonic stenosis. Aorta: The aortic root is normal in size and structure. Venous: The inferior vena cava is normal in size with greater than 50%  respiratory variability, suggesting right atrial pressure of 3 mmHg. IAS/Shunts: No atrial level shunt detected by color flow Doppler.  LEFT VENTRICLE PLAX 2D LVIDd:         4.30 cm     Diastology LVIDs:         2.50 cm     LV e' medial:    5.11 cm/s LV PW:         1.00 cm     LV E/e' medial:  16.3 LV IVS:        1.40 cm     LV e' lateral:   9.57 cm/s LVOT diam:     1.90 cm     LV  E/e' lateral: 8.7 LV SV:         77 LV SV Index:   40 LVOT Area:     2.84 cm  LV Volumes (MOD) LV vol d, MOD A2C: 71.1 ml LV vol d, MOD A4C: 79.5 ml LV vol s, MOD A2C: 18.2 ml LV vol s, MOD A4C: 20.0 ml LV SV MOD A2C:     52.9 ml LV SV MOD A4C:     79.5 ml LV SV MOD BP:      56.3 ml RIGHT VENTRICLE            IVC RV S prime:     9.68 cm/s  IVC diam: 2.00 cm TAPSE (M-mode): 2.5 cm LEFT ATRIUM             Index       RIGHT ATRIUM           Index LA diam:        2.70 cm 1.42 cm/m  RA Area:     10.70 cm LA Vol (A2C):   78.7 ml 41.39 ml/m RA Volume:   23.70 ml  12.46 ml/m LA Vol (A4C):   62.7 ml 32.97 ml/m LA Biplane Vol: 70.7 ml 37.18 ml/m  AORTIC VALVE AV Area (Vmax):    2.50 cm AV Area (Vmean):   2.06 cm AV Area (VTI):     1.85 cm AV Vmax:           175.00 cm/s AV Vmean:          123.000 cm/s AV VTI:            0.413 m AV Peak Grad:      12.2 mmHg AV Mean Grad:      7.0 mmHg LVOT Vmax:         154.00 cm/s LVOT Vmean:        89.400 cm/s LVOT VTI:          0.270 m LVOT/AV VTI ratio: 0.65  AORTA Ao Root diam: 3.00 cm Ao Asc diam:  2.80 cm MITRAL VALVE               TRICUSPID VALVE MV Area (PHT): 2.80 cm    TR Peak grad:   29.4 mmHg MV Decel Time: 271 msec    TR Vmax:        271.00 cm/s MV E velocity: 83.20 cm/s MV A velocity: 93.60 cm/s  SHUNTS MV E/A ratio:  0.89        Systemic VTI:  0.27 m                            Systemic Diam: 1.90 cm Candee Furbish MD Electronically signed by Candee Furbish MD Signature Date/Time: 07/31/2021/5:30:28 PM    Final       Scheduled Meds:  amLODipine  10 mg Oral Daily   atorvastatin  80 mg Oral  Daily   cholecalciferol  1,000 Units Oral Daily   enoxaparin (LOVENOX) injection  40 mg Subcutaneous Q24H   hydrALAZINE  25 mg Oral TID   memantine  10 mg Oral QHS   [START ON 08/02/2021] metoprolol succinate  100 mg Oral Daily   sodium chloride flush  3 mL Intravenous Q12H   Continuous Infusions:  vancomycin 100 mL/hr at 08/01/21 1044     LOS: 2 days      Time spent: 25 minutes   Dessa Phi, DO [facility] 08/01/2021, 11:52  AM   Available via Epic secure chat 7am-7pm After these hours, please refer to coverage provider listed on [email or web address]

## 2021-08-02 LAB — BASIC METABOLIC PANEL
Anion gap: 7 (ref 5–15)
BUN: 10 mg/dL (ref 8–23)
CO2: 25 mmol/L (ref 22–32)
Calcium: 8.5 mg/dL — ABNORMAL LOW (ref 8.9–10.3)
Chloride: 108 mmol/L (ref 98–111)
Creatinine, Ser: 1.28 mg/dL — ABNORMAL HIGH (ref 0.44–1.00)
GFR, Estimated: 42 mL/min — ABNORMAL LOW (ref >60)
Glucose, Bld: 95 mg/dL (ref 70–99)
Potassium: 3.6 mmol/L (ref 3.5–5.1)
Sodium: 140 mmol/L (ref 135–145)

## 2021-08-02 LAB — CULTURE, BLOOD (ROUTINE X 2): Special Requests: ADEQUATE

## 2021-08-02 LAB — CBC
HCT: 32.4 % — ABNORMAL LOW (ref 36.0–46.0)
Hemoglobin: 10.5 g/dL — ABNORMAL LOW (ref 12.0–15.0)
MCH: 26.1 pg (ref 26.0–34.0)
MCHC: 32.4 g/dL (ref 30.0–36.0)
MCV: 80.4 fL (ref 80.0–100.0)
Platelets: 254 10*3/uL (ref 150–400)
RBC: 4.03 MIL/uL (ref 3.87–5.11)
RDW: 15 % (ref 11.5–15.5)
WBC: 6 10*3/uL (ref 4.0–10.5)
nRBC: 0 % (ref 0.0–0.2)

## 2021-08-02 MED ORDER — CLONIDINE HCL 0.1 MG PO TABS
0.1000 mg | ORAL_TABLET | Freq: Every day | ORAL | Status: DC
  Administered 2021-08-02 – 2021-08-04 (×3): 0.1 mg via ORAL
  Filled 2021-08-02 (×3): qty 1

## 2021-08-02 NOTE — Progress Notes (Signed)
PT Cancellation/ Screen Note  Patient Details Name: Sherry Perez MRN: [id number] DOB: 1941/11/07   Cancelled Treatment:    Reason Eval/Treat Not Completed: PT screened, no needs identified, will sign off. Attempted PT eval-pt declined to work with therapy. Family was present and stated pt is mobilizing close to baseline. She doesn't think they will need f/u PT. Will sign off at this time. Please reorder if mobility needs change. Thanks    Doreatha Massed, PT Acute Rehabilitation  Office: [phone] Pager: [phone]

## 2021-08-02 NOTE — Progress Notes (Signed)
Brief ID note:  Patient chart reviewed.  Afebrile over the last 24 hours T-max 98.9.  WBC 6.0 this morning, creatinine 1.3, normal electrolytes.  8/10 blood cultures with 2 out of 4 bottles from the same set positive for staph hominis.  Sensitivities pending.  8/11 blood cultures with 1 out of 4 bottles staph hominis.  Sensitivities pending.  Blood cultures yesterday currently no growth to date.  Transthoracic echocardiogram was completed which shows no evidence of valvular vegetations.  She continues on vancomycin (started 8/11).  Continue current antibiotics for now while awaiting susceptibility reports from positive blood cultures.  Dr. Gale Journey will be back tomorrow.   Raynelle [facility] 08/02/2021, 7:36 AM

## 2021-08-02 NOTE — Plan of Care (Signed)
  Problem: Activity: Goal: Risk for activity intolerance will decrease Outcome: Progressing   Problem: Nutrition: Goal: Adequate nutrition will be maintained Outcome: Progressing   Problem: Coping: Goal: Level of anxiety will decrease Outcome: Progressing   Problem: Pain Managment: Goal: General experience of comfort will improve Outcome: Progressing   

## 2021-08-02 NOTE — Progress Notes (Signed)
PROGRESS NOTE    Sherry Perez WAS  [id number] DOB: November 30, 1941 DOA: 07/30/2021 PCP: [name], NP     Brief Narrative:  DEVORY Perez is a 80 y.o. female with medical history significant for advanced dementia, hypertension, hyperlipidemia, controlled diabetes who was seen in the ED on 8/10 with multiple complaints, not feeling well, headache, myalgia, diarrhea, had work-up in the ED that was not impressive blood cultures were drawn and patient was discharged home.  Then 2 out of 4 blood cultures tested positive for Staphylococcus was called back to the ED and transferred to [facility] for admission.  Infectious disease was consulted.  Repeat cultures obtained.  New events last 24 hours / Subjective: Daughter is at bedside, no acute events overnight.  Patient denies any headaches or pain otherwise.  Afebrile overnight.  Assessment & Plan:   Principal Problem:   Bacterial infection due to Staphylococcus Active Problems:   Diabetes type 2, controlled ([place])   HTN (hypertension)   Hyperlipidemia   Dementia (HCC)   Bacteremia   AKI (acute kidney injury) ([place])   Staph bacteremia -Blood culture obtained 8/10 showing staph hominis -Blood culture obtained 8/11 showing staph hominis -Repeat blood culture 8/13 pending -Echocardiogram without vegetation seen -Continue vancomycin -Infectious disease following  AKI on CKD stage IIIb -Baseline creatinine around 1.4-1.5 -Renal US consistent with medical renal disease -Resolved  Hypertension -Continue hydralazine, metoprolol, Norvasc -Resume Lasix, add Catapres today  Hyperlipidemia -Continue Lipitor  Advanced dementia -Pleasantly demented, continue Namenda  DVT prophylaxis:  enoxaparin (LOVENOX) injection 40 mg Start: 08/01/21 2200 SCDs Start: 07/30/21 1721  Code Status:     Code Status Orders  (From admission, onward)           Start     Ordered   07/30/21 1721  Full code  Continuous        07/30/21 1722            Code Status History     This patient has a current code status but no historical code status.      Advance Directive Documentation    Flowsheet Row Most Recent Value  Type of Advance Directive Healthcare Power of Attorney, Living will  Pre-existing out of facility DNR order (yellow form or pink MOST form) --  "MOST" Form in Place? --      Family Communication: Daughter at bedside Disposition Plan:  Status is: Inpatient  Remains inpatient appropriate because:IV treatments appropriate due to intensity of illness or inability to take PO  Dispo: The patient is from: Home              Anticipated d/c is to: Home              Patient currently is not medically stable to d/c.   Difficult to place patient No      Consultants:  Infectious disease  Procedures:  None   Antimicrobials:  Anti-infectives (From admission, onward)    Start     Dose/Rate Route Frequency Ordered Stop   08/02/21 1200  vancomycin (VANCOREADY) IVPB 750 mg/150 mL        750 mg 150 mL/hr over 60 Minutes Intravenous Every 24 hours 08/01/21 1611     08/01/21 1000  vancomycin (VANCOCIN) IVPB 1000 mg/200 mL premix  Status:  Discontinued        1,000 mg 200 mL/hr over 60 Minutes Intravenous Every 48 hours 07/30/21 1133 07/31/21 1120   08/01/21 1000  vancomycin (VANCOREADY) IVPB 1250 mg/250 mL  Status:  Discontinued        1,250 mg 166.7 mL/hr over 90 Minutes Intravenous Every 48 hours 07/31/21 1120 08/01/21 1611   07/30/21 1115  vancomycin (VANCOCIN) IVPB 1000 mg/200 mL premix        1,000 mg 200 mL/hr over 60 Minutes Intravenous  Once 07/30/21 1107 07/30/21 1404   07/30/21 1100  cefTRIAXone (ROCEPHIN) 2 g in sodium chloride 0.9 % 100 mL IVPB        2 g 200 mL/hr over 30 Minutes Intravenous  Once 07/30/21 1056 07/30/21 1215        Objective: Vitals:   08/01/21 1358 08/01/21 2202 08/02/21 0548 08/02/21 0800  BP: (!) 137/50 (!) 165/65 (!) 187/57 (!) 181/65  Pulse: 62 64 (!) 58 64  Resp: '17  16 18   '$ Temp: 98.9 F (37.2 C) 98.4 F (36.9 C) 98.4 F (36.9 C)   TempSrc: Oral Oral Oral   SpO2: 98% 97% 100%   Weight:      Height:        Intake/Output Summary (Last 24 hours) at 08/02/2021 1049 Last data filed at 08/02/2021 0738 Gross per 24 hour  Intake 1304.05 ml  Output 2600 ml  Net -1295.95 ml    Filed Weights   07/30/21 1000  Weight: 78.5 kg    Examination: General exam: Appears calm and comfortable  Respiratory system: Clear to auscultation. Respiratory effort normal. Cardiovascular system: S1 & S2 heard, RRR. No pedal edema. Gastrointestinal system: Abdomen is nondistended, soft and nontender. Normal bowel sounds heard. Central nervous system: Alert. Non focal exam. Speech clear  Extremities: Symmetric in appearance bilaterally  Skin: No rashes, lesions or ulcers on exposed skin  Psychiatry: Mood & affect appropriate.    Data Reviewed: I have personally reviewed following labs and imaging studies  CBC: Recent Labs  Lab 07/29/21 1100 07/30/21 1003 07/30/21 1558 07/31/21 0357 08/01/21 0400 08/02/21 0319  WBC 11.2* 9.0 9.0 6.6 6.4 6.0  NEUTROABS 10.3* 7.6  --   --   --   --   HGB 12.7 12.8 12.2 11.2* 11.2* 10.5*  HCT 37.1 37.4 37.7 33.8* 34.6* 32.4*  MCV 77.3* 77.9* 80.2 79.5* 80.7 80.4  PLT 305 306 271 267 265 0000000    Basic Metabolic Panel: Recent Labs  Lab 07/29/21 1100 07/30/21 1003 07/30/21 1558 07/31/21 0357 08/01/21 0400 08/02/21 0319  NA 138 139  --  141 140 140  K 3.7 3.2*  --  3.6 3.5 3.6  CL 104 100  --  110 110 108  CO2 21* 23  --  22 21* 25  GLUCOSE 122* 99  --  98 90 95  BUN 22 28*  --  '23 12 10  '$ CREATININE 1.51* 2.66* 2.23* 1.98* 1.34* 1.28*  CALCIUM 9.2 9.7  --  8.6* 8.3* 8.5*    GFR: Estimated Creatinine Clearance: 36.6 mL/min (A) (by C-G formula based on SCr of 1.28 mg/dL (H)). Liver Function Tests: Recent Labs  Lab 07/29/21 1100 07/30/21 1003 07/31/21 0357  AST '22 23 22  '$ ALT '11 10 11  '$ ALKPHOS 56 57 45  BILITOT  0.9 1.0 0.7  PROT 8.1 7.9 6.9  ALBUMIN 4.2 4.3 3.5    No results for input(s): LIPASE, AMYLASE in the last 168 hours. Recent Labs  Lab 07/29/21 1100  AMMONIA <9*    Coagulation Profile: Recent Labs  Lab 07/31/21 0357  INR 1.3*    Cardiac Enzymes: No results for input(s): CKTOTAL, CKMB, CKMBINDEX, TROPONINI in the  last 168 hours. BNP (last 3 results) No results for input(s): PROBNP in the last 8760 hours. HbA1C: No results for input(s): HGBA1C in the last 72 hours. CBG: Recent Labs  Lab 07/29/21 1007  GLUCAP 148*    Lipid Profile: No results for input(s): CHOL, HDL, LDLCALC, TRIG, CHOLHDL, LDLDIRECT in the last 72 hours. Thyroid Function Tests: No results for input(s): TSH, T4TOTAL, FREET4, T3FREE, THYROIDAB in the last 72 hours. Anemia Panel: No results for input(s): VITAMINB12, FOLATE, FERRITIN, TIBC, IRON, RETICCTPCT in the last 72 hours. Sepsis Labs: Recent Labs  Lab 07/29/21 1100 07/30/21 1003 07/30/21 1558 07/31/21 0357 08/01/21 0400  PROCALCITON  --   --  0.21 0.19 <0.10  LATICACIDVEN 1.7 2.6* 1.3  --   --      Recent Results (from the past 240 hour(s))  Blood Cultures (routine x 2)     Status: None (Preliminary result)   Collection Time: 07/29/21 11:10 AM   Specimen: BLOOD LEFT HAND  Result Value Ref Range Status   Specimen Description   Final    BLOOD LEFT HAND Performed at [facility], [address] [place], [place] [address]    Special Requests   Final    BOTTLES DRAWN AEROBIC AND ANAEROBIC Blood Culture results may not be optimal due to an inadequate volume of blood received in culture bottles Performed at [facility], [address] [place], [place] [address]    Culture   Final    NO GROWTH 4 DAYS Performed at [facility], [place] [address] [place], [place] [address]    Report Status PENDING  Incomplete  Blood Cultures (routine x 2)     Status: Abnormal   Collection Time: 07/29/21 11:20 AM   Specimen:  Right Antecubital; Blood  Result Value Ref Range Status   Specimen Description   Final    RIGHT ANTECUBITAL Performed at [facility], [address] [place], [place] [address]    Special Requests   Final    BOTTLES DRAWN AEROBIC AND ANAEROBIC Blood Culture adequate volume Performed at [facility], [address] [place], [place] [address]    Culture  Setup Time   Final    GRAM POSITIVE COCCI IN CLUSTERS IN BOTH AEROBIC AND ANAEROBIC BOTTLES CRITICAL RESULT CALLED TO, READ BACK BY AND VERIFIED WITH: RN Nelida Gores [phone] FCP Performed at [facility], [place] [address] [place], [place] [address]    Culture STAPHYLOCOCCUS HOMINIS (A)  Final   Report Status 08/02/2021 FINAL  Final   Organism ID, Bacteria STAPHYLOCOCCUS HOMINIS  Final      Susceptibility   Staphylococcus hominis - MIC*    CIPROFLOXACIN <=0.5 SENSITIVE Sensitive     ERYTHROMYCIN >=8 RESISTANT Resistant     GENTAMICIN <=0.5 SENSITIVE Sensitive     OXACILLIN <=0.25 SENSITIVE Sensitive     TETRACYCLINE <=1 SENSITIVE Sensitive     VANCOMYCIN 1 SENSITIVE Sensitive     TRIMETH/SULFA <=10 SENSITIVE Sensitive     CLINDAMYCIN <=0.25 SENSITIVE Sensitive     RIFAMPIN <=0.5 SENSITIVE Sensitive     Inducible Clindamycin NEGATIVE Sensitive     * STAPHYLOCOCCUS HOMINIS  Blood Culture ID Panel (Reflexed)     Status: Abnormal   Collection Time: 07/29/21 11:20 AM  Result Value Ref Range Status   Enterococcus faecalis NOT DETECTED NOT DETECTED Final   Enterococcus Faecium NOT DETECTED NOT DETECTED Final   Listeria monocytogenes  NOT DETECTED NOT DETECTED Final   Staphylococcus species DETECTED (A) NOT DETECTED Final    Comment: CRITICAL RESULT CALLED TO, READ BACK BY AND VERIFIED WITH: RN JAMIE B. 0845 [phone] FCP    Staphylococcus aureus (BCID) NOT DETECTED NOT DETECTED Final   Staphylococcus epidermidis NOT DETECTED NOT DETECTED Final   Staphylococcus lugdunensis NOT DETECTED NOT DETECTED Final    Streptococcus species NOT DETECTED NOT DETECTED Final   Streptococcus agalactiae NOT DETECTED NOT DETECTED Final   Streptococcus pneumoniae NOT DETECTED NOT DETECTED Final   Streptococcus pyogenes NOT DETECTED NOT DETECTED Final   A.calcoaceticus-baumannii NOT DETECTED NOT DETECTED Final   Bacteroides fragilis NOT DETECTED NOT DETECTED Final   Enterobacterales NOT DETECTED NOT DETECTED Final   Enterobacter cloacae complex NOT DETECTED NOT DETECTED Final   Escherichia coli NOT DETECTED NOT DETECTED Final   Klebsiella aerogenes NOT DETECTED NOT DETECTED Final   Klebsiella oxytoca NOT DETECTED NOT DETECTED Final   Klebsiella pneumoniae NOT DETECTED NOT DETECTED Final   Proteus species NOT DETECTED NOT DETECTED Final   Salmonella species NOT DETECTED NOT DETECTED Final   Serratia marcescens NOT DETECTED NOT DETECTED Final   Haemophilus influenzae NOT DETECTED NOT DETECTED Final   Neisseria meningitidis NOT DETECTED NOT DETECTED Final   Pseudomonas aeruginosa NOT DETECTED NOT DETECTED Final   Stenotrophomonas maltophilia NOT DETECTED NOT DETECTED Final   Candida albicans NOT DETECTED NOT DETECTED Final   Candida auris NOT DETECTED NOT DETECTED Final   Candida glabrata NOT DETECTED NOT DETECTED Final   Candida krusei NOT DETECTED NOT DETECTED Final   Candida parapsilosis NOT DETECTED NOT DETECTED Final   Candida tropicalis NOT DETECTED NOT DETECTED Final   Cryptococcus neoformans/gattii NOT DETECTED NOT DETECTED Final    Comment: Performed at [facility], [address], [place], [place] [address]  Resp Panel by RT-PCR (Flu A&B, Covid) Nasopharyngeal Swab     Status: None   Collection Time: 07/29/21 11:55 AM   Specimen: Nasopharyngeal Swab; Nasopharyngeal(NP) swabs in vial transport medium  Result Value Ref Range Status   SARS Coronavirus 2 by RT PCR NEGATIVE NEGATIVE Final    Comment: (NOTE) SARS-CoV-2 target nucleic acids are NOT DETECTED.  The SARS-CoV-2 RNA is generally  detectable in upper respiratory specimens during the acute phase of infection. The lowest concentration of SARS-CoV-2 viral copies this assay can detect is 138 copies/mL. A negative result does not preclude SARS-Cov-2 infection and should not be used as the sole basis for treatment or other patient management decisions. A negative result may occur with  improper specimen collection/handling, submission of specimen other than nasopharyngeal swab, presence of viral mutation(s) within the areas targeted by this assay, and inadequate number of viral copies(<138 copies/mL). A negative result must be combined with clinical observations, patient history, and epidemiological information. The expected result is Negative.  Fact Sheet for Patients:  EntrepreneurPulse.com.au  Fact Sheet for Healthcare Providers:  IncredibleEmployment.be  This test is no t yet approved or cleared by the [place] FDA and  has been authorized for detection and/or diagnosis of SARS-CoV-2 by FDA under an Emergency Use Authorization (EUA). This EUA will remain  in effect (meaning this test can be used) for the duration of the COVID-19 declaration under Section 564(b)(1) of the Act, 21 U.S.C.section 360bbb-3(b)(1), unless the authorization is terminated  or revoked sooner.       Influenza A by PCR NEGATIVE NEGATIVE Final   Influenza B by PCR NEGATIVE NEGATIVE Final    Comment: (  NOTE) The Xpert Xpress SARS-CoV-2/FLU/RSV plus assay is intended as an aid in the diagnosis of influenza from Nasopharyngeal swab specimens and should not be used as a sole basis for treatment. Nasal washings and aspirates are unacceptable for Xpert Xpress SARS-CoV-2/FLU/RSV testing.  Fact Sheet for Patients: EntrepreneurPulse.com.au  Fact Sheet for Healthcare Providers: IncredibleEmployment.be  This test is not yet approved or cleared by the [place] FDA  and has been authorized for detection and/or diagnosis of SARS-CoV-2 by FDA under an Emergency Use Authorization (EUA). This EUA will remain in effect (meaning this test can be used) for the duration of the COVID-19 declaration under Section 564(b)(1) of the Act, 21 U.S.C. section 360bbb-3(b)(1), unless the authorization is terminated or revoked.  Performed at [facility], [address] [place], [place] [address]   Blood culture (routine x 2)     Status: Abnormal   Collection Time: 07/30/21 11:12 AM   Specimen: BLOOD  Result Value Ref Range Status   Specimen Description   Final    BLOOD LEFT ANTECUBITAL Performed at [facility], [place] [address], [place], [place] [address]    Special Requests   Final    BOTTLES DRAWN AEROBIC AND ANAEROBIC Blood Culture adequate volume Performed at [facility], [address] [place], [place] [address]    Culture  Setup Time   Final    GRAM POSITIVE COCCI AEROBIC BOTTLE ONLY CRITICAL VALUE NOTED.  VALUE IS CONSISTENT WITH PREVIOUSLY REPORTED AND CALLED VALUE.    Culture (A)  Final    STAPHYLOCOCCUS HOMINIS SUSCEPTIBILITIES PERFORMED ON PREVIOUS CULTURE WITHIN THE LAST 5 DAYS. Performed at [facility], [place] [address] [place], [place] [address]    Report Status 08/02/2021 FINAL  Final  Blood culture (routine x 2)     Status: None (Preliminary result)   Collection Time: 07/30/21  3:57 PM   Specimen: BLOOD RIGHT HAND  Result Value Ref Range Status   Specimen Description   Final    BLOOD RIGHT HAND Performed at [place] [address], [place], [place] [address]    Special Requests   Final    BOTTLES DRAWN AEROBIC ONLY Blood Culture results may not be optimal due to an inadequate volume of blood received in culture bottles Performed at [facility] [address] [place], [place] [address]    Culture   Final    NO GROWTH 3 DAYS Performed at [facility], [place] [address], [place], [place] [address]    Report Status PENDING  Incomplete  Culture, blood (routine x 2)     Status: None (Preliminary result)   Collection Time: 08/01/21  8:54 AM   Specimen: BLOOD  Result Value Ref Range Status   Specimen Description   Final    BLOOD URH Performed at [facility] [address], [place], [place] [address]    Special Requests   Final    BOTTLES DRAWN AEROBIC AND ANAEROBIC Blood Culture adequate volume Performed at [facility] [place] [address], [place], [place] [address]    Culture   Final    NO GROWTH < 24 HOURS Performed at [facility] [address], [place],  [address]    Report Status PENDING  Incomplete  Culture, blood (routine x 2)     Status: None (Preliminary result)   Collection Time: 08/01/21  8:54 AM   Specimen: BLOOD  Result Value Ref Range Status   Specimen Description   Final    BLOOD LRH Performed at [facility] [address], [place], [place] [address]    Special Requests   Final    BOTTLES DRAWN AEROBIC ONLY Blood Culture results may not be optimal due to an inadequate volume of blood received in culture bottles Performed at [facility] [address] [place], [place] [address]    Culture   Final    NO GROWTH < 24 HOURS Performed at [facility] [address] [place], [place] [address]    Report Status PENDING  Incomplete       Radiology Studies: ECHOCARDIOGRAM COMPLETE  Result Date: 07/31/2021    ECHOCARDIOGRAM REPORT   Patient Name:   BRAELYNN HANIGAN Date of Exam: 07/31/2021 Medical Rec #:  [id number]       Height:       67.0 in Accession #:    [id number]      Weight:       173.1 lb Date of Birth:  08/09/1941        BSA:          1.902 m Patient Age:    40 years        BP:           126/45 mmHg Patient Gender: F               HR:           60 bpm. Exam Location:  Inpatient Procedure: 2D Echo, 3D Echo,  Cardiac Doppler and Color Doppler Indications:    Bacteremia  History:        Patient has prior history of Echocardiogram examinations, most                 recent 02/21/2018. Signs/Symptoms:Altered Mental Status,                 Alzheimer's and Syncope; Risk Factors:Hypertension, Diabetes and                 Dyslipidemia.  Sonographer:    Roseanna Rainbow RDCS Referring Phys: [id number] [place] IMPRESSIONS  1. Left ventricular ejection fraction, by estimation, is 70 to 75%. The left ventricle has hyperdynamic function. The left ventricle has no regional wall motion abnormalities. Left ventricular diastolic parameters are consistent with Grade I diastolic dysfunction (impaired relaxation).  2. Right ventricular systolic function is normal. The right ventricular size is normal. There is mildly elevated pulmonary artery systolic pressure. The estimated right ventricular systolic pressure is 0000000 mmHg.  3. The mitral valve is normal in structure. Trivial mitral valve regurgitation. No evidence of mitral stenosis.  4. The aortic valve is tricuspid. There is mild calcification of the aortic valve. There is mild thickening of the aortic valve. Aortic valve regurgitation is not visualized. Mild to moderate aortic valve sclerosis/calcification is present, without any evidence of aortic stenosis.  5. The inferior vena cava is normal in size with greater than 50% respiratory variability, suggesting right atrial pressure of 3 mmHg. Conclusion(s)/Recommendation(s): No evidence of valvular vegetations on this transthoracic echocardiogram. Would recommend a transesophageal echocardiogram to exclude infective endocarditis if clinically indicated. FINDINGS  Left Ventricle: Left ventricular ejection fraction, by estimation, is 70 to 75%. The left ventricle has hyperdynamic function. The left ventricle has no regional wall motion abnormalities. The left ventricular internal cavity size was normal in size. There is no left ventricular  hypertrophy.  Left ventricular diastolic parameters are consistent with Grade I diastolic dysfunction (impaired relaxation). Right Ventricle: The right ventricular size is normal. No increase in right ventricular wall thickness. Right ventricular systolic function is normal. There is mildly elevated pulmonary artery systolic pressure. The tricuspid regurgitant velocity is 2.71  m/s, and with an assumed right atrial pressure of 8 mmHg, the estimated right ventricular systolic pressure is 0000000 mmHg. Left Atrium: Left atrial size was normal in size. Right Atrium: Right atrial size was normal in size. Pericardium: There is no evidence of pericardial effusion. Mitral Valve: The mitral valve is normal in structure. Trivial mitral valve regurgitation. No evidence of mitral valve stenosis. Tricuspid Valve: The tricuspid valve is normal in structure. Tricuspid valve regurgitation is mild . No evidence of tricuspid stenosis. Aortic Valve: The aortic valve is tricuspid. There is mild calcification of the aortic valve. There is mild thickening of the aortic valve. Aortic valve regurgitation is not visualized. Mild to moderate aortic valve sclerosis/calcification is present, without any evidence of aortic stenosis. Aortic valve mean gradient measures 7.0 mmHg. Aortic valve peak gradient measures 12.2 mmHg. Aortic valve area, by VTI measures 1.85 cm. Pulmonic Valve: The pulmonic valve was normal in structure. Pulmonic valve regurgitation is not visualized. No evidence of pulmonic stenosis. Aorta: The aortic root is normal in size and structure. Venous: The inferior vena cava is normal in size with greater than 50% respiratory variability, suggesting right atrial pressure of 3 mmHg. IAS/Shunts: No atrial level shunt detected by color flow Doppler.  LEFT VENTRICLE PLAX 2D LVIDd:         4.30 cm     Diastology LVIDs:         2.50 cm     LV e' medial:    5.11 cm/s LV PW:         1.00 cm     LV E/e' medial:  16.3 LV IVS:        1.40  cm     LV e' lateral:   9.57 cm/s LVOT diam:     1.90 cm     LV E/e' lateral: 8.7 LV SV:         77 LV SV Index:   40 LVOT Area:     2.84 cm  LV Volumes (MOD) LV vol d, MOD A2C: 71.1 ml LV vol d, MOD A4C: 79.5 ml LV vol s, MOD A2C: 18.2 ml LV vol s, MOD A4C: 20.0 ml LV SV MOD A2C:     52.9 ml LV SV MOD A4C:     79.5 ml LV SV MOD BP:      56.3 ml RIGHT VENTRICLE            IVC RV S prime:     9.68 cm/s  IVC diam: 2.00 cm TAPSE (M-mode): 2.5 cm LEFT ATRIUM             Index       RIGHT ATRIUM           Index LA diam:        2.70 cm 1.42 cm/m  RA Area:     10.70 cm LA Vol (A2C):   78.7 ml 41.39 ml/m RA Volume:   23.70 ml  12.46 ml/m LA Vol (A4C):   62.7 ml 32.97 ml/m LA Biplane Vol: 70.7 ml 37.18 ml/m  AORTIC VALVE AV Area (Vmax):    2.50 cm AV Area (Vmean):   2.06 cm AV Area (VTI):  1.85 cm AV Vmax:           175.00 cm/s AV Vmean:          123.000 cm/s AV VTI:            0.413 m AV Peak Grad:      12.2 mmHg AV Mean Grad:      7.0 mmHg LVOT Vmax:         154.00 cm/s LVOT Vmean:        89.400 cm/s LVOT VTI:          0.270 m LVOT/AV VTI ratio: 0.65  AORTA Ao Root diam: 3.00 cm Ao Asc diam:  2.80 cm MITRAL VALVE               TRICUSPID VALVE MV Area (PHT): 2.80 cm    TR Peak grad:   29.4 mmHg MV Decel Time: 271 msec    TR Vmax:        271.00 cm/s MV E velocity: 83.20 cm/s MV A velocity: 93.60 cm/s  SHUNTS MV E/A ratio:  0.89        Systemic VTI:  0.27 m                            Systemic Diam: 1.90 cm Candee Furbish MD Electronically signed by Candee Furbish MD Signature Date/Time: 07/31/2021/5:30:28 PM    Final       Scheduled Meds:  amLODipine  10 mg Oral Daily   atorvastatin  80 mg Oral Daily   cholecalciferol  1,000 Units Oral Daily   cloNIDine  0.1 mg Oral Daily   enoxaparin (LOVENOX) injection  40 mg Subcutaneous Q24H   furosemide  40 mg Oral Daily   hydrALAZINE  25 mg Oral TID   memantine  10 mg Oral QHS   metoprolol succinate  100 mg Oral Daily   sodium chloride flush  3 mL Intravenous Q12H    Continuous Infusions:  vancomycin       LOS: 3 days      Time spent: 25 minutes   Dessa Phi, DO [facility] 08/02/2021, 10:49 AM   Available via Epic secure chat 7am-7pm After these hours, please refer to coverage provider listed on [email or web address]

## 2021-08-02 NOTE — Plan of Care (Signed)
  Problem: Clinical Measurements: Goal: Respiratory complications will improve Outcome: Progressing   Problem: Clinical Measurements: Goal: Cardiovascular complication will be avoided Outcome: Progressing   Problem: Coping: Goal: Level of anxiety will decrease Outcome: Progressing   Problem: Skin Integrity: Goal: Risk for impaired skin integrity will decrease Outcome: Progressing

## 2021-08-03 LAB — COMPREHENSIVE METABOLIC PANEL
ALT: 10 U/L (ref 0–44)
AST: 23 U/L (ref 15–41)
Albumin: 4.3 g/dL (ref 3.5–5.0)
Alkaline Phosphatase: 57 U/L (ref 38–126)
Anion gap: 16 — ABNORMAL HIGH (ref 5–15)
BUN: 28 mg/dL — ABNORMAL HIGH (ref 8–23)
CO2: 23 mmol/L (ref 22–32)
Calcium: 9.7 mg/dL (ref 8.9–10.3)
Chloride: 100 mmol/L (ref 98–111)
Creatinine, Ser: 2.66 mg/dL — ABNORMAL HIGH (ref 0.44–1.00)
GFR, Estimated: 17 mL/min — ABNORMAL LOW (ref >60)
Glucose, Bld: 99 mg/dL (ref 70–99)
Potassium: 3.2 mmol/L — ABNORMAL LOW (ref 3.5–5.1)
Sodium: 139 mmol/L (ref 135–145)
Total Bilirubin: 1 mg/dL (ref 0.3–1.2)
Total Protein: 7.9 g/dL (ref 6.5–8.1)

## 2021-08-03 LAB — CBC
HCT: 31.5 % — ABNORMAL LOW (ref 36.0–46.0)
Hemoglobin: 10.3 g/dL — ABNORMAL LOW (ref 12.0–15.0)
MCH: 26.1 pg (ref 26.0–34.0)
MCHC: 32.7 g/dL (ref 30.0–36.0)
MCV: 79.7 fL — ABNORMAL LOW (ref 80.0–100.0)
Platelets: 252 10*3/uL (ref 150–400)
RBC: 3.95 MIL/uL (ref 3.87–5.11)
RDW: 14.8 % (ref 11.5–15.5)
WBC: 5.7 10*3/uL (ref 4.0–10.5)
nRBC: 0 % (ref 0.0–0.2)

## 2021-08-03 LAB — BASIC METABOLIC PANEL
Anion gap: 3 — ABNORMAL LOW (ref 5–15)
BUN: 13 mg/dL (ref 8–23)
CO2: 24 mmol/L (ref 22–32)
Calcium: 8.2 mg/dL — ABNORMAL LOW (ref 8.9–10.3)
Chloride: 109 mmol/L (ref 98–111)
Creatinine, Ser: 1.45 mg/dL — ABNORMAL HIGH (ref 0.44–1.00)
GFR, Estimated: 36 mL/min — ABNORMAL LOW (ref >60)
Glucose, Bld: 92 mg/dL (ref 70–99)
Potassium: 3.6 mmol/L (ref 3.5–5.1)
Sodium: 136 mmol/L (ref 135–145)

## 2021-08-03 LAB — CULTURE, BLOOD (ROUTINE X 2): Culture: NO GROWTH

## 2021-08-03 MED ORDER — LINEZOLID 600 MG PO TABS
600.0000 mg | ORAL_TABLET | Freq: Two times a day (BID) | ORAL | Status: DC
  Administered 2021-08-03 – 2021-08-04 (×3): 600 mg via ORAL
  Filled 2021-08-03 (×3): qty 1

## 2021-08-03 MED ORDER — HYDRALAZINE HCL 50 MG PO TABS
50.0000 mg | ORAL_TABLET | Freq: Three times a day (TID) | ORAL | Status: DC
  Administered 2021-08-03 – 2021-08-04 (×4): 50 mg via ORAL
  Filled 2021-08-03 (×4): qty 1

## 2021-08-03 NOTE — Progress Notes (Signed)
PROGRESS NOTE    Sherry Perez  [id number] DOB: 27-Apr-1941 DOA: 07/30/2021 PCP: [name], NP     Brief Narrative:  Sherry Perez is a 80 y.o. female with medical history significant for advanced dementia, hypertension, hyperlipidemia, controlled diabetes who was seen in the ED on 8/10 with multiple complaints, not feeling well, headache, myalgia, diarrhea, had work-up in the ED that was not impressive blood cultures were drawn and patient was discharged home.  Then 2 out of 4 blood cultures tested positive for Staphylococcus was called back to the ED and transferred to [facility] for admission.  Infectious disease was consulted.  Repeat cultures obtained.  New events last 24 hours / Subjective: Patient without any complaints, BP better this morning   Assessment & Plan:   Principal Problem:   Bacterial infection due to Staphylococcus Active Problems:   Diabetes type 2, controlled (HCC)   HTN (hypertension)   Hyperlipidemia   Dementia (HCC)   Bacteremia   AKI (acute kidney injury) ([place])   Staph bacteremia -Blood culture obtained 8/10 showing staph hominis -Blood culture obtained 8/11 showing staph hominis, susceptibilities pending  -Repeat blood culture 8/13 negative to date  -Echocardiogram without vegetation seen -Vanco --> Zyvox  -Infectious disease following  AKI on CKD stage IIIb -Baseline creatinine around 1.4-1.5 -Renal US consistent with medical renal disease -Resolved  Hypertension -Continue hydralazine, metoprolol, Norvasc, Lasix, Catapres   Hyperlipidemia -Continue Lipitor  Advanced dementia -Pleasantly demented, continue Namenda  DVT prophylaxis:  enoxaparin (LOVENOX) injection 40 mg Start: 08/01/21 2200 SCDs Start: 07/30/21 1721  Code Status:     Code Status Orders  (From admission, onward)           Start     Ordered   07/30/21 1721  Full code  Continuous        07/30/21 1722           Code Status History     This patient  has a current code status but no historical code status.      Advance Directive Documentation    Flowsheet Row Most Recent Value  Type of Advance Directive Healthcare Power of Attorney, Living will  Pre-existing out of facility DNR order (yellow form or pink MOST form) --  "MOST" Form in Place? --      Family Communication: Daughter at bedside Disposition Plan:  Status is: Inpatient  Remains inpatient appropriate because:IV treatments appropriate due to intensity of illness or inability to take PO  Dispo: The patient is from: Home              Anticipated d/c is to: Home              Patient currently is not medically stable to d/c.   Difficult to place patient No      Consultants:  Infectious disease  Procedures:  None   Antimicrobials:  Anti-infectives (From admission, onward)    Start     Dose/Rate Route Frequency Ordered Stop   08/03/21 1200  linezolid (ZYVOX) tablet 600 mg        600 mg Oral Every 12 hours 08/03/21 1030 08/06/21 0959   08/02/21 1200  vancomycin (VANCOREADY) IVPB 750 mg/150 mL  Status:  Discontinued        750 mg 150 mL/hr over 60 Minutes Intravenous Every 24 hours 08/01/21 1611 08/03/21 1030   08/01/21 1000  vancomycin (VANCOCIN) IVPB 1000 mg/200 mL premix  Status:  Discontinued  1,000 mg 200 mL/hr over 60 Minutes Intravenous Every 48 hours 07/30/21 1133 07/31/21 1120   08/01/21 1000  vancomycin (VANCOREADY) IVPB 1250 mg/250 mL  Status:  Discontinued        1,250 mg 166.7 mL/hr over 90 Minutes Intravenous Every 48 hours 07/31/21 1120 08/01/21 1611   07/30/21 1115  vancomycin (VANCOCIN) IVPB 1000 mg/200 mL premix        1,000 mg 200 mL/hr over 60 Minutes Intravenous  Once 07/30/21 1107 07/30/21 1404   07/30/21 1100  cefTRIAXone (ROCEPHIN) 2 g in sodium chloride 0.9 % 100 mL IVPB        2 g 200 mL/hr over 30 Minutes Intravenous  Once 07/30/21 1056 07/30/21 1215        Objective: Vitals:   08/03/21 0645 08/03/21 0657 08/03/21 1139  08/03/21 1350  BP: (!) 191/59 (!) 181/54 (!) 111/52 (!) 116/58  Pulse: 60 64 (!) 49 (!) 51  Resp: '16  18 16  '$ Temp:  98.4 F (36.9 C) 98.1 F (36.7 C) 98 F (36.7 C)  TempSrc:  Oral Oral   SpO2: 100%  100% 99%  Weight:      Height:        Intake/Output Summary (Last 24 hours) at 08/03/2021 1434 Last data filed at 08/03/2021 1128 Gross per 24 hour  Intake 480 ml  Output 2350 ml  Net -1870 ml    Filed Weights   07/30/21 1000  Weight: 78.5 kg    Examination: General exam: Appears calm and comfortable  Respiratory system: Clear to auscultation. Respiratory effort normal. Cardiovascular system: S1 & S2 heard, RRR. No pedal edema. Gastrointestinal system: Abdomen is nondistended, soft and nontender. Normal bowel sounds heard. Central nervous system: Alert. Non focal exam. Speech clear  Extremities: Symmetric in appearance bilaterally  Skin: No rashes, lesions or ulcers on exposed skin  Psychiatry: Stable, dementia, pleasant    Data Reviewed: I have personally reviewed following labs and imaging studies  CBC: Recent Labs  Lab 07/29/21 1100 07/30/21 1003 07/30/21 1558 07/31/21 0357 08/01/21 0400 08/02/21 0319 08/03/21 0310  WBC 11.2* 9.0 9.0 6.6 6.4 6.0 5.7  NEUTROABS 10.3* 7.6  --   --   --   --   --   HGB 12.7 12.8 12.2 11.2* 11.2* 10.5* 10.3*  HCT 37.1 37.4 37.7 33.8* 34.6* 32.4* 31.5*  MCV 77.3* 77.9* 80.2 79.5* 80.7 80.4 79.7*  PLT 305 306 271 267 265 254 [id number]    Basic Metabolic Panel: Recent Labs  Lab 07/30/21 1003 07/30/21 1558 07/31/21 0357 08/01/21 0400 08/02/21 0319 08/03/21 0310  NA 139  --  141 140 140 136  K 3.2*  --  3.6 3.5 3.6 3.6  CL 100  --  110 110 108 109  CO2 23  --  22 21* 25 24  GLUCOSE 99  --  98 90 95 92  BUN 28*  --  '23 12 10 13  '$ CREATININE 2.66* 2.23* 1.98* 1.34* 1.28* 1.45*  CALCIUM 9.7  --  8.6* 8.3* 8.5* 8.2*    GFR: Estimated Creatinine Clearance: 32.3 mL/min (A) (by C-G formula based on SCr of 1.45 mg/dL (H)). Liver  Function Tests: Recent Labs  Lab 07/29/21 1100 07/30/21 1003 07/31/21 0357  AST '22 23 22  '$ ALT '11 10 11  '$ ALKPHOS 56 57 45  BILITOT 0.9 1.0 0.7  PROT 8.1 7.9 6.9  ALBUMIN 4.2 4.3 3.5    No results for input(s): LIPASE, AMYLASE in the last 168 hours. Recent  Labs  Lab 07/29/21 1100  AMMONIA <9*    Coagulation Profile: Recent Labs  Lab 07/31/21 0357  INR 1.3*    Cardiac Enzymes: No results for input(s): CKTOTAL, CKMB, CKMBINDEX, TROPONINI in the last 168 hours. BNP (last 3 results) No results for input(s): PROBNP in the last 8760 hours. HbA1C: No results for input(s): HGBA1C in the last 72 hours. CBG: Recent Labs  Lab 07/29/21 1007  GLUCAP 148*    Lipid Profile: No results for input(s): CHOL, HDL, LDLCALC, TRIG, CHOLHDL, LDLDIRECT in the last 72 hours. Thyroid Function Tests: No results for input(s): TSH, T4TOTAL, FREET4, T3FREE, THYROIDAB in the last 72 hours. Anemia Panel: No results for input(s): VITAMINB12, FOLATE, FERRITIN, TIBC, IRON, RETICCTPCT in the last 72 hours. Sepsis Labs: Recent Labs  Lab 07/29/21 1100 07/30/21 1003 07/30/21 1558 07/31/21 0357 08/01/21 0400  PROCALCITON  --   --  0.21 0.19 <0.10  LATICACIDVEN 1.7 2.6* 1.3  --   --      Recent Results (from the past 240 hour(s))  Blood Cultures (routine x 2)     Status: None   Collection Time: 07/29/21 11:10 AM   Specimen: BLOOD LEFT HAND  Result Value Ref Range Status   Specimen Description   Final    BLOOD LEFT HAND Performed at [facility], [address] [place], [place] [address]    Special Requests   Final    BOTTLES DRAWN AEROBIC AND ANAEROBIC Blood Culture results may not be optimal due to an inadequate volume of blood received in culture bottles Performed at [facility], [address] [place], [place] [address]    Culture   Final    NO GROWTH 5 DAYS Performed at [facility], [place] [address], [place], [place] [address]    Report Status  08/03/2021 FINAL  Final  Blood Cultures (routine x 2)     Status: Abnormal   Collection Time: 07/29/21 11:20 AM   Specimen: Right Antecubital; Blood  Result Value Ref Range Status   Specimen Description   Final    RIGHT ANTECUBITAL Performed at [facility], [address] [place], [place] [address]    Special Requests   Final    BOTTLES DRAWN AEROBIC AND ANAEROBIC Blood Culture adequate volume Performed at [facility], [address] [place], [place] [address]    Culture  Setup Time   Final    GRAM POSITIVE COCCI IN CLUSTERS IN BOTH AEROBIC AND ANAEROBIC BOTTLES CRITICAL RESULT CALLED TO, READ BACK BY AND VERIFIED WITH: RN Nelida Gores [phone] FCP Performed at [facility], [place] [address] [place], [place] [address]    Culture STAPHYLOCOCCUS HOMINIS (A)  Final   Report Status 08/02/2021 FINAL  Final   Organism ID, Bacteria STAPHYLOCOCCUS HOMINIS  Final      Susceptibility   Staphylococcus hominis - MIC*    CIPROFLOXACIN <=0.5 SENSITIVE Sensitive     ERYTHROMYCIN >=8 RESISTANT Resistant     GENTAMICIN <=0.5 SENSITIVE Sensitive     OXACILLIN <=0.25 SENSITIVE Sensitive     TETRACYCLINE <=1 SENSITIVE Sensitive     VANCOMYCIN 1 SENSITIVE Sensitive     TRIMETH/SULFA <=10 SENSITIVE Sensitive     CLINDAMYCIN <=0.25 SENSITIVE Sensitive     RIFAMPIN <=0.5 SENSITIVE Sensitive     Inducible Clindamycin NEGATIVE Sensitive     * STAPHYLOCOCCUS HOMINIS  Blood Culture ID Panel (Reflexed)     Status:  Abnormal   Collection Time: 07/29/21 11:20 AM  Result Value Ref Range Status   Enterococcus faecalis NOT DETECTED NOT DETECTED Final   Enterococcus Faecium NOT DETECTED NOT DETECTED Final   Listeria monocytogenes NOT DETECTED NOT DETECTED Final   Staphylococcus species DETECTED (A) NOT DETECTED Final    Comment: CRITICAL RESULT CALLED TO, READ BACK BY AND VERIFIED WITH: RN JAMIE B. [phone] [phone] FCP    Staphylococcus aureus (BCID) NOT DETECTED NOT DETECTED Final    Staphylococcus epidermidis NOT DETECTED NOT DETECTED Final   Staphylococcus lugdunensis NOT DETECTED NOT DETECTED Final   Streptococcus species NOT DETECTED NOT DETECTED Final   Streptococcus agalactiae NOT DETECTED NOT DETECTED Final   Streptococcus pneumoniae NOT DETECTED NOT DETECTED Final   Streptococcus pyogenes NOT DETECTED NOT DETECTED Final   A.calcoaceticus-baumannii NOT DETECTED NOT DETECTED Final   Bacteroides fragilis NOT DETECTED NOT DETECTED Final   Enterobacterales NOT DETECTED NOT DETECTED Final   Enterobacter cloacae complex NOT DETECTED NOT DETECTED Final   Escherichia coli NOT DETECTED NOT DETECTED Final   Klebsiella aerogenes NOT DETECTED NOT DETECTED Final   Klebsiella oxytoca NOT DETECTED NOT DETECTED Final   Klebsiella pneumoniae NOT DETECTED NOT DETECTED Final   Proteus species NOT DETECTED NOT DETECTED Final   Salmonella species NOT DETECTED NOT DETECTED Final   Serratia marcescens NOT DETECTED NOT DETECTED Final   Haemophilus influenzae NOT DETECTED NOT DETECTED Final   Neisseria meningitidis NOT DETECTED NOT DETECTED Final   Pseudomonas aeruginosa NOT DETECTED NOT DETECTED Final   Stenotrophomonas maltophilia NOT DETECTED NOT DETECTED Final   Candida albicans NOT DETECTED NOT DETECTED Final   Candida auris NOT DETECTED NOT DETECTED Final   Candida glabrata NOT DETECTED NOT DETECTED Final   Candida krusei NOT DETECTED NOT DETECTED Final   Candida parapsilosis NOT DETECTED NOT DETECTED Final   Candida tropicalis NOT DETECTED NOT DETECTED Final   Cryptococcus neoformans/gattii NOT DETECTED NOT DETECTED Final    Comment: Performed at [facility], [address], [place], [place] [address]  Resp Panel by RT-PCR (Flu A&B, Covid) Nasopharyngeal Swab     Status: None   Collection Time: 07/29/21 11:55 AM   Specimen: Nasopharyngeal Swab; Nasopharyngeal(NP) swabs in vial transport medium  Result Value Ref Range Status   SARS Coronavirus 2 by RT PCR NEGATIVE  NEGATIVE Final    Comment: (NOTE) SARS-CoV-2 target nucleic acids are NOT DETECTED.  The SARS-CoV-2 RNA is generally detectable in upper respiratory specimens during the acute phase of infection. The lowest concentration of SARS-CoV-2 viral copies this assay can detect is 138 copies/mL. A negative result does not preclude SARS-Cov-2 infection and should not be used as the sole basis for treatment or other patient management decisions. A negative result may occur with  improper specimen collection/handling, submission of specimen other than nasopharyngeal swab, presence of viral mutation(s) within the areas targeted by this assay, and inadequate number of viral copies(<138 copies/mL). A negative result must be combined with clinical observations, patient history, and epidemiological information. The expected result is Negative.  Fact Sheet for Patients:  EntrepreneurPulse.com.au  Fact Sheet for Healthcare Providers:  IncredibleEmployment.be  This test is no t yet approved or cleared by the [place] FDA and  has been authorized for detection and/or diagnosis of SARS-CoV-2 by FDA under an Emergency Use Authorization (EUA). This EUA will remain  in effect (meaning this test can be used) for the duration of the COVID-19 declaration under Section 564(b)(1) of the Act, 21 U.S.C.section  360bbb-3(b)(1), unless the authorization is terminated  or revoked sooner.       Influenza A by PCR NEGATIVE NEGATIVE Final   Influenza B by PCR NEGATIVE NEGATIVE Final    Comment: (NOTE) The Xpert Xpress SARS-CoV-2/FLU/RSV plus assay is intended as an aid in the diagnosis of influenza from Nasopharyngeal swab specimens and should not be used as a sole basis for treatment. Nasal washings and aspirates are unacceptable for Xpert Xpress SARS-CoV-2/FLU/RSV testing.  Fact Sheet for Patients: EntrepreneurPulse.com.au  Fact Sheet for Healthcare  Providers: IncredibleEmployment.be  This test is not yet approved or cleared by the [place] FDA and has been authorized for detection and/or diagnosis of SARS-CoV-2 by FDA under an Emergency Use Authorization (EUA). This EUA will remain in effect (meaning this test can be used) for the duration of the COVID-19 declaration under Section 564(b)(1) of the Act, 21 U.S.C. section 360bbb-3(b)(1), unless the authorization is terminated or revoked.  Performed at [facility], [address] [place], [place] [address]   Blood culture (routine x 2)     Status: Abnormal (Preliminary result)   Collection Time: 07/30/21 11:12 AM   Specimen: BLOOD  Result Value Ref Range Status   Specimen Description   Final    BLOOD LEFT ANTECUBITAL Performed at [facility], [place] [address] [place], [place] [address]    Special Requests   Final    BOTTLES DRAWN AEROBIC AND ANAEROBIC Blood Culture adequate volume Performed at [facility], [address] [place], [place] [address]    Culture  Setup Time   Final    GRAM POSITIVE COCCI AEROBIC BOTTLE ONLY CRITICAL VALUE NOTED.  VALUE IS CONSISTENT WITH PREVIOUSLY REPORTED AND CALLED VALUE.    Culture (A)  Final    STAPHYLOCOCCUS HOMINIS SUSCEPTIBILITIES TO FOLLOW Performed at [facility], [place] [address], [place], [place] [address]    Report Status PENDING  Incomplete  Blood culture (routine x 2)     Status: None (Preliminary result)   Collection Time: 07/30/21  3:57 PM   Specimen: BLOOD RIGHT HAND  Result Value Ref Range Status   Specimen Description   Final    BLOOD RIGHT HAND Performed at [facility] [address], [place], [place] [address]    Special Requests   Final    BOTTLES DRAWN AEROBIC ONLY Blood Culture results may not be optimal due to an inadequate volume of blood received in culture bottles Performed at [facility] [address], [place], [place] [address]    Culture   Final    NO GROWTH 4 DAYS Performed at [facility], [place] [address], [place], [place] [address]    Report Status PENDING  Incomplete  Culture, blood (routine x 2)     Status: None (Preliminary result)   Collection Time: 08/01/21  8:54 AM   Specimen: BLOOD  Result Value Ref Range Status   Specimen Description   Final    BLOOD URH Performed at [facility] [address], [place], [place] [address]    Special Requests   Final    BOTTLES DRAWN AEROBIC AND ANAEROBIC Blood Culture adequate volume Performed at [facility] [address] [place], [place] [address]    Culture   Final    NO GROWTH 2 DAYS Performed at [facility] [address] [place], [place] [address]    Report Status  PENDING  Incomplete  Culture, blood (routine x 2)     Status: None (Preliminary result)   Collection Time: 08/01/21  8:54 AM   Specimen: BLOOD  Result Value Ref Range Status   Specimen Description   Final    BLOOD LRH Performed at [facility] [address] [place], [place] [address]    Special Requests   Final    BOTTLES DRAWN AEROBIC ONLY Blood Culture results may not be optimal due to an inadequate volume of blood received in culture bottles Performed at [facility] [address] [place], [place] [address]    Culture   Final    NO GROWTH 2 DAYS Performed at [facility] [address], [place],  [address]    Report Status PENDING  Incomplete       Radiology Studies: No results found.    Scheduled Meds:  amLODipine  10 mg Oral Daily   atorvastatin  80 mg Oral Daily   cholecalciferol  1,000 Units Oral Daily   cloNIDine  0.1 mg Oral Daily   enoxaparin (LOVENOX) injection  40 mg Subcutaneous Q24H   furosemide  40 mg Oral Daily   hydrALAZINE  50 mg Oral TID   linezolid  600 mg Oral Q12H   memantine  10 mg Oral QHS   metoprolol  succinate  100 mg Oral Daily   sodium chloride flush  3 mL Intravenous Q12H   Continuous Infusions:     LOS: 4 days      Time spent: 25 minutes   Dessa Phi, DO [facility] 08/03/2021, 2:34 PM   Available via Epic secure chat 7am-7pm After these hours, please refer to coverage provider listed on [email or web address]

## 2021-08-03 NOTE — Plan of Care (Signed)
  Problem: Clinical Measurements: Goal: Diagnostic test results will improve Outcome: Progressing   

## 2021-08-03 NOTE — Progress Notes (Signed)
[facility]ious [facility]  Date of Admission:  07/30/2021     CC: bacteremia  Lines: Peripheral iv's  Abx: 8/15-c linezolid  8/11-15 vanc  ASSESSMENT: 80 yo female advanced dementia, dm2 initially seen in ED 8/10 for multiple complaints for brief ams, right sided body pain, urinary incontinence which is new recalled 8/11 after bcx grew staph hominis   8/10 bcx 1 of 2 set staph hominis (sensitive tetra, bactrim, oxacillin, cipro, clinda) 8/11 bcx repeat  1 of 2 set staph hominis 8/13 bcx ngtd   No hardware/catheter   No peripheral stigmata endocarditis   ConS would be an extremely rare organism to cause native valve endocarditis. Tte so far negative. Will await the 8/11 susceptibility profile. If similar to 8/10, will discuss with patient approaches --> 1) more reasonable to treat 5 days abx and observe vs 2) pursue immediate tee.   Favor 1 given lack of sepsis/abnormal labs and staph hominis of low burden  PLAN: Switch abx to linezolid F/u 8/11 bcx susceptibility Discussed with primary team  I spent more than 35 minute reviewing data/chart, and coordinating care and >50% direct face to face time providing counseling/discussing diagnostics/treatment plan with patient   Principal Problem:   Bacterial infection due to Staphylococcus Active Problems:   Diabetes type 2, controlled ([place])   HTN (hypertension)   Hyperlipidemia   Dementia ([place])   Bacteremia   AKI (acute kidney injury) ([place])   Allergies  Allergen Reactions   Lisinopril     Renal insufficiency    Scheduled Meds:  amLODipine  10 mg Oral Daily   atorvastatin  80 mg Oral Daily   cholecalciferol  1,000 Units Oral Daily   cloNIDine  0.1 mg Oral Daily   enoxaparin (LOVENOX) injection  40 mg Subcutaneous Q24H   furosemide  40 mg Oral Daily   hydrALAZINE  50 mg Oral TID   linezolid  600 mg Oral Q12H   memantine  10 mg Oral QHS   metoprolol succinate  100 mg Oral Daily   sodium  chloride flush  3 mL Intravenous Q12H   Continuous Infusions: PRN Meds:.acetaminophen **OR** acetaminophen, hydrALAZINE, ondansetron **OR** ondansetron (ZOFRAN) IV, polyethylene glycol, senna-docusate, traMADol   SUBJECTIVE: No complaint today No rash No diarrhea Bcx 8/11 still pending susceptibility testing  No focal pain  Daughter by bedside  Review of Systems: ROS All other ROS was negative, except mentioned above     OBJECTIVE: Vitals:   08/03/21 0645 08/03/21 0657 08/03/21 1139 08/03/21 1350  BP: (!) 191/59 (!) 181/54 (!) 111/52 (!) 116/58  Pulse: 60 64 (!) 49 (!) 51  Resp: '16  18 16  '$ Temp:  98.4 F (36.9 C) 98.1 F (36.7 C) 98 F (36.7 C)  TempSrc:  Oral Oral   SpO2: 100%  100% 99%  Weight:      Height:       Body mass index is 27.11 kg/m.  Physical Exam  General/constitutional: no distress, pleasant HEENT: Normocephalic, PER, Conj Clear, EOMI, Oropharynx clear Neck supple CV: rrr no mrg Lungs: clear to auscultation, normal respiratory effort Abd: Soft, Nontender Ext: no edema Skin: No Rash Neuro: nonfocal MSK: no peripheral joint swelling/tenderness/warmth; back spines nontender   Central line presence: no  Lab Results Lab Results  Component Value Date   WBC 5.7 08/03/2021   HGB 10.3 (L) 08/03/2021   HCT 31.5 (L) 08/03/2021   MCV 79.7 (L) 08/03/2021   PLT 252 08/03/2021  Lab Results  Component Value Date   CREATININE 1.45 (H) 08/03/2021   BUN 13 08/03/2021   NA 136 08/03/2021   K 3.6 08/03/2021   CL 109 08/03/2021   CO2 24 08/03/2021    Lab Results  Component Value Date   ALT 11 07/31/2021   AST 22 07/31/2021   ALKPHOS 45 07/31/2021   BILITOT 0.7 07/31/2021      Microbiology: Recent Results (from the past 240 hour(s))  Blood Cultures (routine x 2)     Status: None   Collection Time: 07/29/21 11:10 AM   Specimen: BLOOD LEFT HAND  Result Value Ref Range Status   Specimen Description   Final    BLOOD LEFT HAND Performed  at [facility], [address] [place], [place] [address]    Special Requests   Final    BOTTLES DRAWN AEROBIC AND ANAEROBIC Blood Culture results may not be optimal due to an inadequate volume of blood received in culture bottles Performed at [facility], [address] [place], [place] [address]    Culture   Final    NO GROWTH 5 DAYS Performed at [facility], [employer] [address], [place], [place] [address]    Report Status 08/03/2021 FINAL  Final  Blood Cultures (routine x 2)     Status: Abnormal   Collection Time: 07/29/21 11:20 AM   Specimen: Right Antecubital; Blood  Result Value Ref Range Status   Specimen Description   Final    RIGHT ANTECUBITAL Performed at [facility], [address] [place], [place] [address]    Special Requests   Final    BOTTLES DRAWN AEROBIC AND ANAEROBIC Blood Culture adequate volume Performed at [facility], [address] [place], [place] [address]    Culture  Setup Time   Final    GRAM POSITIVE COCCI IN CLUSTERS IN BOTH AEROBIC AND ANAEROBIC BOTTLES CRITICAL RESULT CALLED TO, READ BACK BY AND VERIFIED WITH: RN Nelida Gores [phone] FCP Performed at [facility], [place] [address], [place], [place] [address]    Culture STAPHYLOCOCCUS HOMINIS (A)  Final   Report Status 08/02/2021 FINAL  Final   Organism ID, Bacteria STAPHYLOCOCCUS HOMINIS  Final      Susceptibility   Staphylococcus hominis - MIC*    CIPROFLOXACIN <=0.5 SENSITIVE Sensitive     ERYTHROMYCIN >=8 RESISTANT Resistant     GENTAMICIN <=0.5 SENSITIVE Sensitive     OXACILLIN <=0.25 SENSITIVE Sensitive     TETRACYCLINE <=1 SENSITIVE Sensitive     VANCOMYCIN 1 SENSITIVE Sensitive     TRIMETH/SULFA <=10 SENSITIVE Sensitive     CLINDAMYCIN <=0.25 SENSITIVE Sensitive     RIFAMPIN <=0.5 SENSITIVE Sensitive     Inducible Clindamycin NEGATIVE Sensitive     * STAPHYLOCOCCUS HOMINIS  Blood Culture ID Panel (Reflexed)     Status: Abnormal    Collection Time: 07/29/21 11:20 AM  Result Value Ref Range Status   Enterococcus faecalis NOT DETECTED NOT DETECTED Final   Enterococcus Faecium NOT DETECTED NOT DETECTED Final   Listeria monocytogenes NOT DETECTED NOT DETECTED Final   Staphylococcus species DETECTED (A) NOT DETECTED Final    Comment: CRITICAL RESULT CALLED TO, READ BACK BY AND VERIFIED WITH: RN JAMIE B. [id number] [phone] FCP    Staphylococcus aureus (BCID) NOT DETECTED NOT DETECTED Final   Staphylococcus epidermidis NOT DETECTED NOT DETECTED Final   Staphylococcus lugdunensis NOT DETECTED NOT DETECTED  Final   Streptococcus species NOT DETECTED NOT DETECTED Final   Streptococcus agalactiae NOT DETECTED NOT DETECTED Final   Streptococcus pneumoniae NOT DETECTED NOT DETECTED Final   Streptococcus pyogenes NOT DETECTED NOT DETECTED Final   A.calcoaceticus-baumannii NOT DETECTED NOT DETECTED Final   Bacteroides fragilis NOT DETECTED NOT DETECTED Final   Enterobacterales NOT DETECTED NOT DETECTED Final   Enterobacter cloacae complex NOT DETECTED NOT DETECTED Final   Escherichia coli NOT DETECTED NOT DETECTED Final   Klebsiella aerogenes NOT DETECTED NOT DETECTED Final   Klebsiella oxytoca NOT DETECTED NOT DETECTED Final   Klebsiella pneumoniae NOT DETECTED NOT DETECTED Final   Proteus species NOT DETECTED NOT DETECTED Final   Salmonella species NOT DETECTED NOT DETECTED Final   Serratia marcescens NOT DETECTED NOT DETECTED Final   Haemophilus influenzae NOT DETECTED NOT DETECTED Final   Neisseria meningitidis NOT DETECTED NOT DETECTED Final   Pseudomonas aeruginosa NOT DETECTED NOT DETECTED Final   Stenotrophomonas maltophilia NOT DETECTED NOT DETECTED Final   Candida albicans NOT DETECTED NOT DETECTED Final   Candida auris NOT DETECTED NOT DETECTED Final   Candida glabrata NOT DETECTED NOT DETECTED Final   Candida krusei NOT DETECTED NOT DETECTED Final   Candida parapsilosis NOT DETECTED NOT DETECTED Final   Candida  tropicalis NOT DETECTED NOT DETECTED Final   Cryptococcus neoformans/gattii NOT DETECTED NOT DETECTED Final    Comment: Performed at [facility], [address], [place],  [address]  Resp Panel by RT-PCR (Flu A&B, Covid) Nasopharyngeal Swab     Status: None   Collection Time: 07/29/21 11:55 AM   Specimen: Nasopharyngeal Swab; Nasopharyngeal(NP) swabs in vial transport medium  Result Value Ref Range Status   SARS Coronavirus 2 by RT PCR NEGATIVE NEGATIVE Final    Comment: (NOTE) SARS-CoV-2 target nucleic acids are NOT DETECTED.  The SARS-CoV-2 RNA is generally detectable in upper respiratory specimens during the acute phase of infection. The lowest concentration of SARS-CoV-2 viral copies this assay can detect is 138 copies/mL. A negative result does not preclude SARS-Cov-2 infection and should not be used as the sole basis for treatment or other patient management decisions. A negative result may occur with  improper specimen collection/handling, submission of specimen other than nasopharyngeal swab, presence of viral mutation(s) within the areas targeted by this assay, and inadequate number of viral copies(<138 copies/mL). A negative result must be combined with clinical observations, patient history, and epidemiological information. The expected result is Negative.  Fact Sheet for Patients:  EntrepreneurPulse.com.au  Fact Sheet for Healthcare Providers:  IncredibleEmployment.be  This test is no t yet approved or cleared by the [place] FDA and  has been authorized for detection and/or diagnosis of SARS-CoV-2 by FDA under an Emergency Use Authorization (EUA). This EUA will remain  in effect (meaning this test can be used) for the duration of the COVID-19 declaration under Section 564(b)(1) of the Act, 21 U.S.C.section 360bbb-3(b)(1), unless the authorization is terminated  or revoked sooner.       Influenza A by PCR  NEGATIVE NEGATIVE Final   Influenza B by PCR NEGATIVE NEGATIVE Final    Comment: (NOTE) The Xpert Xpress SARS-CoV-2/FLU/RSV plus assay is intended as an aid in the diagnosis of influenza from Nasopharyngeal swab specimens and should not be used as a sole basis for treatment. Nasal washings and aspirates are unacceptable for Xpert Xpress SARS-CoV-2/FLU/RSV testing.  Fact Sheet for Patients: EntrepreneurPulse.com.au  Fact Sheet for Healthcare Providers: IncredibleEmployment.be  This test is not yet  approved or cleared by the [place] and has been authorized for detection and/or diagnosis of SARS-CoV-2 by FDA under an Emergency Use Authorization (EUA). This EUA will remain in effect (meaning this test can be used) for the duration of the COVID-19 declaration under Section 564(b)(1) of the Act, 21 U.S.C. section 360bbb-3(b)(1), unless the authorization is terminated or revoked.  Performed at [facility], [address] [place], [place] [address]   Blood culture (routine x 2)     Status: Abnormal (Preliminary result)   Collection Time: 07/30/21 11:12 AM   Specimen: BLOOD  Result Value Ref Range Status   Specimen Description   Final    BLOOD LEFT ANTECUBITAL Performed at [facility], [place] [address] [place], [place] [address]    Special Requests   Final    BOTTLES DRAWN AEROBIC AND ANAEROBIC Blood Culture adequate volume Performed at [facility], [address] [place], [place] [address]    Culture  Setup Time   Final    GRAM POSITIVE COCCI AEROBIC BOTTLE ONLY CRITICAL VALUE NOTED.  VALUE IS CONSISTENT WITH PREVIOUSLY REPORTED AND CALLED VALUE.    Culture (A)  Final    STAPHYLOCOCCUS HOMINIS SUSCEPTIBILITIES TO FOLLOW Performed at [facility], [place] [address] [place], [place] [address]    Report Status PENDING  Incomplete  Blood culture (routine x 2)     Status: None (Preliminary result)    Collection Time: 07/30/21  3:57 PM   Specimen: BLOOD RIGHT HAND  Result Value Ref Range Status   Specimen Description   Final    BLOOD RIGHT HAND Performed at [facility] [address], [place], [place] [address]    Special Requests   Final    BOTTLES DRAWN AEROBIC ONLY Blood Culture results may not be optimal due to an inadequate volume of blood received in culture bottles Performed at [facility] [address] [place], [place] [address]    Culture   Final    NO GROWTH 4 DAYS Performed at [facility], [place] [address] [place], [place] [address]    Report Status PENDING  Incomplete  Culture, blood (routine x 2)     Status: None (Preliminary result)   Collection Time: 08/01/21  8:54 AM   Specimen: BLOOD  Result Value Ref Range Status   Specimen Description   Final    BLOOD URH Performed at [facility] [address] [place], [place] [address]    Special Requests   Final    BOTTLES DRAWN AEROBIC AND ANAEROBIC Blood Culture adequate volume Performed at [facility] [address], [place], [place] [address]    Culture   Final    NO GROWTH 2 DAYS Performed at [facility] [address], [place], [place] [address]    Report Status PENDING  Incomplete  Culture, blood (routine x 2)     Status: None (Preliminary result)   Collection Time: 08/01/21  8:54 AM   Specimen: BLOOD  Result Value Ref Range Status   Specimen Description   Final    BLOOD LRH Performed at [facility] [address], [place], [place] [address]    Special Requests   Final    BOTTLES DRAWN AEROBIC ONLY Blood Culture results may not be optimal due to an inadequate volume of blood received in culture bottles Performed at [facility],  [place] [address], [place], [place] [address]    Culture   Final    NO GROWTH 2 DAYS Performed at [facility] [address],  [place], [place] [address]    Report Status PENDING  Incomplete     Serology:   Imaging: If present, new imagings (plain films, ct scans, and mri) have been personally visualized and interpreted; radiology reports have been reviewed. Decision making incorporated into the Impression / Recommendations.  8/12 tte No evidence of valvular vegetations on  this transthoracic echocardiogram. Would recommend a transesophageal  echocardiogram to exclude infective endocarditis if clinically indicated  Jabier Mutton, [place] for [place] [phone] pager    08/03/2021, 2:34 PM

## 2021-08-04 LAB — CULTURE, BLOOD (ROUTINE X 2): Culture: NO GROWTH

## 2021-08-04 LAB — BASIC METABOLIC PANEL
Anion gap: 7 (ref 5–15)
BUN: 16 mg/dL (ref 8–23)
CO2: 25 mmol/L (ref 22–32)
Calcium: 8.3 mg/dL — ABNORMAL LOW (ref 8.9–10.3)
Chloride: 104 mmol/L (ref 98–111)
Creatinine, Ser: 1.53 mg/dL — ABNORMAL HIGH (ref 0.44–1.00)
GFR, Estimated: 34 mL/min — ABNORMAL LOW (ref >60)
Glucose, Bld: 98 mg/dL (ref 70–99)
Potassium: 3.4 mmol/L — ABNORMAL LOW (ref 3.5–5.1)
Sodium: 136 mmol/L (ref 135–145)

## 2021-08-04 MED ORDER — HYDRALAZINE HCL 50 MG PO TABS: 50.0000 mg | ORAL_TABLET | Freq: Three times a day (TID) | ORAL | 2 refills | Status: DC

## 2021-08-04 MED ORDER — CLONIDINE HCL 0.1 MG PO TABS: 0.1000 mg | ORAL_TABLET | Freq: Every day | ORAL | 2 refills | Status: DC

## 2021-08-04 MED ORDER — POTASSIUM CHLORIDE CRYS ER 20 MEQ PO TBCR
40.0000 meq | EXTENDED_RELEASE_TABLET | Freq: Once | ORAL | Status: AC
  Administered 2021-08-04: 40 meq via ORAL
  Filled 2021-08-04: qty 2

## 2021-08-04 MED ORDER — LINEZOLID 600 MG PO TABS: 600.0000 mg | ORAL_TABLET | Freq: Two times a day (BID) | ORAL | 0 refills | Status: AC

## 2021-08-04 NOTE — Discharge Summary (Signed)
Physician Discharge Summary  Sherry Perez [id number] DOB: Dec 07, 1941 DOA: 07/30/2021  PCP: [name], NP  Admit date: 07/30/2021 Discharge date: 08/04/2021  Admitted From: Home Disposition: Home  Recommendations for Outpatient Follow-up:  Follow up with PCP in 1 week  Discharge Condition: Stable CODE STATUS: Full code Diet recommendation:  Diet Orders (From admission, onward)     Start     Ordered   07/30/21 1721  DIET DYS 3 Room service appropriate? Yes; Fluid consistency: Thin  Diet effective now       Question Answer Comment  Room service appropriate? Yes   Fluid consistency: Thin      07/30/21 1722             Brief/Interim Summary: Sherry Perez is a 80 y.o. female with medical history significant for advanced dementia, hypertension, hyperlipidemia, controlled diabetes who was seen in the ED on 8/10 with multiple complaints, not feeling well, headache, myalgia, diarrhea, had work-up in the ED that was not impressive blood cultures were drawn and patient was discharged home.  Then 2 out of 4 blood cultures tested positive for Staphylococcus was called back to the ED and transferred to [facility] for admission.  Infectious disease was consulted.  Repeat cultures obtained.  Patient was initially treated with IV vancomycin.  This was transitioned to Zyvox for discharge.  TTE was negative for vegetation.  Patient remained afebrile and in stable condition.  Discharge Diagnoses:  Principal Problem:   Bacterial infection due to Staphylococcus Active Problems:   Diabetes type 2, controlled ([facility])   HTN (hypertension)   Hyperlipidemia   Dementia (HCC)   Bacteremia   AKI (acute kidney injury) ([place])   Staph bacteremia -Blood culture obtained 8/10 showing staph hominis -Blood culture obtained 8/11 showing staph hominis, susceptibilities pending  -Repeat blood culture 8/13 negative to date  -Echocardiogram without vegetation seen -Vanco --> Zyvox  -Infectious disease  following  AKI on CKD stage IIIb -Baseline creatinine around 1.4-1.5 -Renal US consistent with medical renal disease  Hypertension -Continue hydralazine, metoprolol, Norvasc, Catapres, Lasix  Hyperlipidemia -Continue Lipitor  Advanced dementia -Pleasantly demented, continue Namenda   Hypokalemia -Replace   Discharge Instructions  Discharge Instructions     Call MD for:  difficulty breathing, headache or visual disturbances   Complete by: As directed    Call MD for:  extreme fatigue   Complete by: As directed    Call MD for:  hives   Complete by: As directed    Call MD for:  persistant dizziness or light-headedness   Complete by: As directed    Call MD for:  persistant nausea and vomiting   Complete by: As directed    Call MD for:  severe uncontrolled pain   Complete by: As directed    Call MD for:  temperature >100.4   Complete by: As directed    Increase activity slowly   Complete by: As directed       Allergies as of 08/04/2021       Reactions   Lisinopril    Renal insufficiency        Medication List     TAKE these medications    acetaminophen 500 MG tablet Commonly known as: TYLENOL Take 2 tablets (1,000 mg total) by mouth 2 (two) times daily. What changed:  when to take this reasons to take this   amLODipine 10 MG tablet Commonly known as: NORVASC TAKE ONE TABLET BY MOUTH ONE TIME DAILY   atorvastatin 80 MG  tablet Commonly known as: LIPITOR TAKE ONE TABLET BY MOUTH ONE TIME DAILY   cloNIDine 0.1 MG tablet Commonly known as: CATAPRES Take 1 tablet (0.1 mg total) by mouth daily. Start taking on: August 05, 2021   furosemide 40 MG tablet Commonly known as: LASIX TAKE ONE TABLET BY MOUTH ONE TIME DAILY   hydrALAZINE 50 MG tablet Commonly known as: APRESOLINE Take 1 tablet (50 mg total) by mouth 3 (three) times daily. What changed:  medication strength how much to take   linezolid 600 MG tablet Commonly known as: ZYVOX Take 1  tablet (600 mg total) by mouth every 12 (twelve) hours for 5 days.   memantine 10 MG tablet Commonly known as: NAMENDA Take 1 tablet (10 mg at night) for 2 weeks, then increase to 1 tablet (10 mg) twice a day What changed:  how much to take how to take this when to take this additional instructions   metoprolol succinate 100 MG 24 hr tablet Commonly known as: TOPROL-XL TAKE ONE TABLET BY MOUTH ONE TIME DAILY TAKE WITH OR IMMEDIATELY FOLLOWING A MEAL What changed: See the new instructions.   potassium chloride SA 20 MEQ tablet Commonly known as: KLOR-CON TAKE ONE-HALF TABLET BY MOUTH ONE TIME DAILY What changed: See the new instructions.   traMADol 50 MG tablet Commonly known as: ULTRAM Take 1 tablet (50 mg total) by mouth every 6 (six) hours as needed.   VITAMIN B12 PO Take 1 tablet by mouth daily.        Follow-up Information     [name], NP Follow up.   Specialty: Internal Medicine Contact information: [place] [address] [phone]                Allergies  Allergen Reactions   Lisinopril     Renal insufficiency    Consultations: Infectious disease   Procedures/Studies: DG Pelvis 1-2 Views  Result Date: 07/29/2021 CLINICAL DATA:  Right lower extremity pain.  No known injury. EXAM: PELVIS - 1-2 VIEW COMPARISON:  None. FINDINGS: There is no evidence of pelvic fracture or diastasis. No pelvic bone lesions are seen. IMPRESSION: Negative. Electronically Signed   By: Marijo Conception M.D.   On: 07/29/2021 14:28   CT HEAD WO CONTRAST  Result Date: 07/29/2021 CLINICAL DATA:  Trauma EXAM: CT HEAD WITHOUT CONTRAST TECHNIQUE: Contiguous axial images were obtained from the base of the skull through the vertex without intravenous contrast. COMPARISON:  Head CT 02/25/2020 FINDINGS: Brain: There is no evidence of acute intracranial hemorrhage, extra-axial fluid collection, or infarct. Foci of hypodensity in the  subcortical and periventricular white matter are nonspecific but likely reflect sequela of chronic white matter microangiopathy, not significantly changed. Mild parenchymal volume loss is unchanged. The ventricles are stable in size. There is no midline shift. No mass lesion is identified. Vascular: There is calcification of the bilateral cavernous ICAs. Skull: There is minimal stranding in the left parietal scalp without underlying calvarial fracture. There is no suspicious osseous lesion. Sinuses/Orbits: The imaged paranasal sinuses are clear. The mastoid air cells are clear. A left lens implant is noted. The globes and orbits are otherwise unremarkable. Other: There is degenerative change of the left temporomandibular joint. IMPRESSION: 1. No acute intracranial hemorrhage or calvarial fracture. 2. Stable mild parenchymal volume loss and chronic white matter microangiopathy. Electronically Signed   By: Valetta Mole MD   On: 07/29/2021 11:17   CT ABDOMEN PELVIS W CONTRAST  Result  Date: 07/29/2021 CLINICAL DATA:  Abdominal distension. EXAM: CT ABDOMEN AND PELVIS WITH CONTRAST TECHNIQUE: Multidetector CT imaging of the abdomen and pelvis was performed using the standard protocol following bolus administration of intravenous contrast. CONTRAST:  54m OMNIPAQUE IOHEXOL 300 MG/ML  SOLN COMPARISON:  July 03, 2021. FINDINGS: Lower chest: No acute abnormality. Hepatobiliary: No cholelithiasis or biliary dilatation is noted. Stable probable hepatic cysts are noted. Pancreas: Unremarkable. No pancreatic ductal dilatation or surrounding inflammatory changes. Spleen: Normal in size without focal abnormality. Adrenals/Urinary Tract: Adrenal glands appear normal. Stable bilateral renal cortical scarring is noted. No hydronephrosis or renal obstruction is noted. No renal or ureteral calculi are noted. Mild urinary bladder distention is noted. Stomach/Bowel: Stomach is within normal limits. Appendix appears normal. No evidence  of bowel wall thickening, distention, or inflammatory changes. Vascular/Lymphatic: Aortic atherosclerosis. No enlarged abdominal or pelvic lymph nodes. Reproductive: Status post hysterectomy. No adnexal masses. Other: No abdominal wall hernia or abnormality. No abdominopelvic ascites. Musculoskeletal: No acute or significant osseous findings. IMPRESSION: Stable hepatic cysts. Stable bilateral renal cortical scarring. No acute abnormality seen in the abdomen or pelvis. Aortic Atherosclerosis (ICD10-I70.0). Electronically Signed   By: JMarijo ConceptionM.D.   On: 07/29/2021 16:17   [place]NAL  Result Date: 07/30/2021 CLINICAL DATA:  Acute kidney injury EXAM: RENAL / URINARY TRACT ULTRASOUND COMPLETE COMPARISON:  CT 07/29/2021 FINDINGS: Right Kidney: Renal measurements: 8.3 x 3.6 x 3.5 cm = volume: 54 mL. Cortex is echogenic. No mass or hydronephrosis. Left Kidney: Renal measurements: 8 x 5.3 x 3.7 cm = volume: 82.2 mL. Cortex is echogenic. No mass or hydronephrosis. Bladder: Appears normal for degree of bladder distention. Other: None. IMPRESSION: Atrophic echogenic kidneys bilaterally consistent with medical renal disease. No hydronephrosis Electronically Signed   By: KDonavan FoilM.D.   On: 07/30/2021 20:29   DG Chest Port 1 View  Result Date: 07/29/2021 CLINICAL DATA:  The altered Mental status EXAM: PORTABLE CHEST 1 VIEW COMPARISON:  Chest radiograph 04/08/2021 FINDINGS: The cardiomediastinal silhouette is stable. Calcified atherosclerotic plaque is again noted at the aortic arch. The lungs are clear, with no focal consolidation or pulmonary edema. There is no pleural effusion or pneumothorax. A small density in the right apex may reflect a calcified granuloma. The bones are unremarkable. IMPRESSION: No radiographic evidence of acute cardiopulmonary process. Electronically Signed   By: PValetta MoleMD   On: 07/29/2021 11:20   ECHOCARDIOGRAM COMPLETE  Result Date: 07/31/2021    ECHOCARDIOGRAM REPORT    Patient Name:   Sherry FELKINSDate of Exam: 07/31/2021 Medical Rec #:  [id number]      Height:       67.0 in Accession #:    [id number]     Weight:       173.1 lb Date of Birth:  412-13-1942       BSA:          1.902 m Patient Age:    89years        BP:           126/45 mmHg Patient Gender: F               HR:           60 bpm. Exam Location:  Inpatient Procedure: 2D Echo, 3D Echo, Cardiac Doppler and Color Doppler Indications:    Bacteremia  History:        Patient has prior history of Echocardiogram examinations, most  recent 02/21/2018. Signs/Symptoms:Altered Mental Status,                 Alzheimer's and Syncope; Risk Factors:Hypertension, Diabetes and                 Dyslipidemia.  Sonographer:    Roseanna Rainbow RDCS Referring Phys: [id number] [place] IMPRESSIONS  1. Left ventricular ejection fraction, by estimation, is 70 to 75%. The left ventricle has hyperdynamic function. The left ventricle has no regional wall motion abnormalities. Left ventricular diastolic parameters are consistent with Grade I diastolic dysfunction (impaired relaxation).  2. Right ventricular systolic function is normal. The right ventricular size is normal. There is mildly elevated pulmonary artery systolic pressure. The estimated right ventricular systolic pressure is 0000000 mmHg.  3. The mitral valve is normal in structure. Trivial mitral valve regurgitation. No evidence of mitral stenosis.  4. The aortic valve is tricuspid. There is mild calcification of the aortic valve. There is mild thickening of the aortic valve. Aortic valve regurgitation is not visualized. Mild to moderate aortic valve sclerosis/calcification is present, without any evidence of aortic stenosis.  5. The inferior vena cava is normal in size with greater than 50% respiratory variability, suggesting right atrial pressure of 3 mmHg. Conclusion(s)/Recommendation(s): No evidence of valvular vegetations on this transthoracic echocardiogram. Would recommend a  transesophageal echocardiogram to exclude infective endocarditis if clinically indicated. FINDINGS  Left Ventricle: Left ventricular ejection fraction, by estimation, is 70 to 75%. The left ventricle has hyperdynamic function. The left ventricle has no regional wall motion abnormalities. The left ventricular internal cavity size was normal in size. There is no left ventricular hypertrophy. Left ventricular diastolic parameters are consistent with Grade I diastolic dysfunction (impaired relaxation). Right Ventricle: The right ventricular size is normal. No increase in right ventricular wall thickness. Right ventricular systolic function is normal. There is mildly elevated pulmonary artery systolic pressure. The tricuspid regurgitant velocity is 2.71  m/s, and with an assumed right atrial pressure of 8 mmHg, the estimated right ventricular systolic pressure is 0000000 mmHg. Left Atrium: Left atrial size was normal in size. Right Atrium: Right atrial size was normal in size. Pericardium: There is no evidence of pericardial effusion. Mitral Valve: The mitral valve is normal in structure. Trivial mitral valve regurgitation. No evidence of mitral valve stenosis. Tricuspid Valve: The tricuspid valve is normal in structure. Tricuspid valve regurgitation is mild . No evidence of tricuspid stenosis. Aortic Valve: The aortic valve is tricuspid. There is mild calcification of the aortic valve. There is mild thickening of the aortic valve. Aortic valve regurgitation is not visualized. Mild to moderate aortic valve sclerosis/calcification is present, without any evidence of aortic stenosis. Aortic valve mean gradient measures 7.0 mmHg. Aortic valve peak gradient measures 12.2 mmHg. Aortic valve area, by VTI measures 1.85 cm. Pulmonic Valve: The pulmonic valve was normal in structure. Pulmonic valve regurgitation is not visualized. No evidence of pulmonic stenosis. Aorta: The aortic root is normal in size and structure. Venous: The  inferior vena cava is normal in size with greater than 50% respiratory variability, suggesting right atrial pressure of 3 mmHg. IAS/Shunts: No atrial level shunt detected by color flow Doppler.  LEFT VENTRICLE PLAX 2D LVIDd:         4.30 cm     Diastology LVIDs:         2.50 cm     LV e' medial:    5.11 cm/s LV PW:         1.00 cm  LV E/e' medial:  16.3 LV IVS:        1.40 cm     LV e' lateral:   9.57 cm/s LVOT diam:     1.90 cm     LV E/e' lateral: 8.7 LV SV:         77 LV SV Index:   40 LVOT Area:     2.84 cm  LV Volumes (MOD) LV vol d, MOD A2C: 71.1 ml LV vol d, MOD A4C: 79.5 ml LV vol s, MOD A2C: 18.2 ml LV vol s, MOD A4C: 20.0 ml LV SV MOD A2C:     52.9 ml LV SV MOD A4C:     79.5 ml LV SV MOD BP:      56.3 ml RIGHT VENTRICLE            IVC RV S prime:     9.68 cm/s  IVC diam: 2.00 cm TAPSE (M-mode): 2.5 cm LEFT ATRIUM             Index       RIGHT ATRIUM           Index LA diam:        2.70 cm 1.42 cm/m  RA Area:     10.70 cm LA Vol (A2C):   78.7 ml 41.39 ml/m RA Volume:   23.70 ml  12.46 ml/m LA Vol (A4C):   62.7 ml 32.97 ml/m LA Biplane Vol: 70.7 ml 37.18 ml/m  AORTIC VALVE AV Area (Vmax):    2.50 cm AV Area (Vmean):   2.06 cm AV Area (VTI):     1.85 cm AV Vmax:           175.00 cm/s AV Vmean:          123.000 cm/s AV VTI:            0.413 m AV Peak Grad:      12.2 mmHg AV Mean Grad:      7.0 mmHg LVOT Vmax:         154.00 cm/s LVOT Vmean:        89.400 cm/s LVOT VTI:          0.270 m LVOT/AV VTI ratio: 0.65  AORTA Ao Root diam: 3.00 cm Ao Asc diam:  2.80 cm MITRAL VALVE               TRICUSPID VALVE MV Area (PHT): 2.80 cm    TR Peak grad:   29.4 mmHg MV Decel Time: 271 msec    TR Vmax:        271.00 cm/s MV E velocity: 83.20 cm/s MV A velocity: 93.60 cm/s  SHUNTS MV E/A ratio:  0.89        Systemic VTI:  0.27 m                            Systemic Diam: 1.90 cm Candee Furbish MD Electronically signed by Candee Furbish MD Signature Date/Time: 07/31/2021/5:30:28 PM    Final    DG FEMUR, MIN 2 VIEWS  RIGHT  Result Date: 07/29/2021 CLINICAL DATA:  Right lower extremity pain without known injury. EXAM: RIGHT FEMUR 2 VIEWS COMPARISON:  None. FINDINGS: There is no evidence of fracture or other focal bone lesions. Soft tissues are unremarkable. IMPRESSION: Negative. Electronically Signed   By: Marijo Conception M.D.   On: 07/29/2021 14:30       Discharge Exam: Vitals:  08/04/21 0454 08/04/21 1346  BP: (!) 137/51 (!) 125/51  Pulse: (!) 56 (!) 49  Resp: 16 17  Temp: 98.6 F (37 C) 98.2 F (36.8 C)  SpO2: 100% 97%    General exam: Appears calm and comfortable  Respiratory system: Clear to auscultation. Respiratory effort normal. Cardiovascular system: S1 & S2 heard, RRR. No pedal edema. Gastrointestinal system: Abdomen is nondistended, soft and nontender. Normal bowel sounds heard. Central nervous system: Alert. Non focal exam.  Extremities: Symmetric in appearance bilaterally  Skin: No rashes, lesions or ulcers on exposed skin  Psychiatry: Stable    The results of significant diagnostics from this hospitalization (including imaging, microbiology, ancillary and laboratory) are listed below for reference.     Microbiology: Recent Results (from the past 240 hour(s))  Blood Cultures (routine x 2)     Status: None   Collection Time: 07/29/21 11:10 AM   Specimen: BLOOD LEFT HAND  Result Value Ref Range Status   Specimen Description   Final    BLOOD LEFT HAND Performed at [facility], [address] [place], [place] [address]    Special Requests   Final    BOTTLES DRAWN AEROBIC AND ANAEROBIC Blood Culture results may not be optimal due to an inadequate volume of blood received in culture bottles Performed at [facility], [address] [place], [place] [address]    Culture   Final    NO GROWTH 5 DAYS Performed at [facility], [place] [address], [place], [place] [address]    Report Status 08/03/2021 FINAL  Final  Blood Cultures (routine x 2)      Status: Abnormal   Collection Time: 07/29/21 11:20 AM   Specimen: Right Antecubital; Blood  Result Value Ref Range Status   Specimen Description   Final    RIGHT ANTECUBITAL Performed at [facility], [address] [place], [place] [address]    Special Requests   Final    BOTTLES DRAWN AEROBIC AND ANAEROBIC Blood Culture adequate volume Performed at [facility], [address] [place], [place] [address]    Culture  Setup Time   Final    GRAM POSITIVE COCCI IN CLUSTERS IN BOTH AEROBIC AND ANAEROBIC BOTTLES CRITICAL RESULT CALLED TO, READ BACK BY AND VERIFIED WITH: RN Nelida Gores [phone] FCP Performed at [facility], [place] [address], [place], [place] [address]    Culture STAPHYLOCOCCUS HOMINIS (A)  Final   Report Status 08/02/2021 FINAL  Final   Organism ID, Bacteria STAPHYLOCOCCUS HOMINIS  Final      Susceptibility   Staphylococcus hominis - MIC*    CIPROFLOXACIN <=0.5 SENSITIVE Sensitive     ERYTHROMYCIN >=8 RESISTANT Resistant     GENTAMICIN <=0.5 SENSITIVE Sensitive     OXACILLIN <=0.25 SENSITIVE Sensitive     TETRACYCLINE <=1 SENSITIVE Sensitive     VANCOMYCIN 1 SENSITIVE Sensitive     TRIMETH/SULFA <=10 SENSITIVE Sensitive     CLINDAMYCIN <=0.25 SENSITIVE Sensitive     RIFAMPIN <=0.5 SENSITIVE Sensitive     Inducible Clindamycin NEGATIVE Sensitive     * STAPHYLOCOCCUS HOMINIS  Blood Culture ID Panel (Reflexed)     Status: Abnormal   Collection Time: 07/29/21 11:20 AM  Result Value Ref Range Status   Enterococcus faecalis NOT DETECTED NOT DETECTED Final   Enterococcus Faecium NOT DETECTED NOT DETECTED Final   Listeria monocytogenes NOT DETECTED NOT DETECTED Final   Staphylococcus  species DETECTED (A) NOT DETECTED Final    Comment: CRITICAL RESULT CALLED TO, READ BACK BY AND VERIFIED WITH: RN JAMIE B. 0845 [phone] FCP    Staphylococcus aureus (BCID) NOT DETECTED NOT DETECTED Final   Staphylococcus epidermidis NOT DETECTED NOT DETECTED  Final   Staphylococcus lugdunensis NOT DETECTED NOT DETECTED Final   Streptococcus species NOT DETECTED NOT DETECTED Final   Streptococcus agalactiae NOT DETECTED NOT DETECTED Final   Streptococcus pneumoniae NOT DETECTED NOT DETECTED Final   Streptococcus pyogenes NOT DETECTED NOT DETECTED Final   A.calcoaceticus-baumannii NOT DETECTED NOT DETECTED Final   Bacteroides fragilis NOT DETECTED NOT DETECTED Final   Enterobacterales NOT DETECTED NOT DETECTED Final   Enterobacter cloacae complex NOT DETECTED NOT DETECTED Final   Escherichia coli NOT DETECTED NOT DETECTED Final   Klebsiella aerogenes NOT DETECTED NOT DETECTED Final   Klebsiella oxytoca NOT DETECTED NOT DETECTED Final   Klebsiella pneumoniae NOT DETECTED NOT DETECTED Final   Proteus species NOT DETECTED NOT DETECTED Final   Salmonella species NOT DETECTED NOT DETECTED Final   Serratia marcescens NOT DETECTED NOT DETECTED Final   Haemophilus influenzae NOT DETECTED NOT DETECTED Final   Neisseria meningitidis NOT DETECTED NOT DETECTED Final   Pseudomonas aeruginosa NOT DETECTED NOT DETECTED Final   Stenotrophomonas maltophilia NOT DETECTED NOT DETECTED Final   Candida albicans NOT DETECTED NOT DETECTED Final   Candida auris NOT DETECTED NOT DETECTED Final   Candida glabrata NOT DETECTED NOT DETECTED Final   Candida krusei NOT DETECTED NOT DETECTED Final   Candida parapsilosis NOT DETECTED NOT DETECTED Final   Candida tropicalis NOT DETECTED NOT DETECTED Final   Cryptococcus neoformans/gattii NOT DETECTED NOT DETECTED Final    Comment: Performed at [facility], [address], [place], [place] [address]  Resp Panel by RT-PCR (Flu A&B, Covid) Nasopharyngeal Swab     Status: None   Collection Time: 07/29/21 11:55 AM   Specimen: Nasopharyngeal Swab; Nasopharyngeal(NP) swabs in vial transport medium  Result Value Ref Range Status   SARS Coronavirus 2 by RT PCR NEGATIVE NEGATIVE Final    Comment: (NOTE) SARS-CoV-2 target  nucleic acids are NOT DETECTED.  The SARS-CoV-2 RNA is generally detectable in upper respiratory specimens during the acute phase of infection. The lowest concentration of SARS-CoV-2 viral copies this assay can detect is 138 copies/mL. A negative result does not preclude SARS-Cov-2 infection and should not be used as the sole basis for treatment or other patient management decisions. A negative result may occur with  improper specimen collection/handling, submission of specimen other than nasopharyngeal swab, presence of viral mutation(s) within the areas targeted by this assay, and inadequate number of viral copies(<138 copies/mL). A negative result must be combined with clinical observations, patient history, and epidemiological information. The expected result is Negative.  Fact Sheet for Patients:  EntrepreneurPulse.com.au  Fact Sheet for Healthcare Providers:  IncredibleEmployment.be  This test is no t yet approved or cleared by the [place] FDA and  has been authorized for detection and/or diagnosis of SARS-CoV-2 by FDA under an Emergency Use Authorization (EUA). This EUA will remain  in effect (meaning this test can be used) for the duration of the COVID-19 declaration under Section 564(b)(1) of the Act, 21 U.S.C.section 360bbb-3(b)(1), unless the authorization is terminated  or revoked sooner.       Influenza A by PCR NEGATIVE NEGATIVE Final   Influenza B by PCR NEGATIVE NEGATIVE Final    Comment: (NOTE) The Xpert Xpress SARS-CoV-2/FLU/RSV plus assay is  intended as an aid in the diagnosis of influenza from Nasopharyngeal swab specimens and should not be used as a sole basis for treatment. Nasal washings and aspirates are unacceptable for Xpert Xpress SARS-CoV-2/FLU/RSV testing.  Fact Sheet for Patients: EntrepreneurPulse.com.au  Fact Sheet for Healthcare  Providers: IncredibleEmployment.be  This test is not yet approved or cleared by the [place] FDA and has been authorized for detection and/or diagnosis of SARS-CoV-2 by FDA under an Emergency Use Authorization (EUA). This EUA will remain in effect (meaning this test can be used) for the duration of the COVID-19 declaration under Section 564(b)(1) of the Act, 21 U.S.C. section 360bbb-3(b)(1), unless the authorization is terminated or revoked.  Performed at [facility], [address] [place], [place] [address]   Blood culture (routine x 2)     Status: Abnormal (Preliminary result)   Collection Time: 07/30/21 11:12 AM   Specimen: BLOOD  Result Value Ref Range Status   Specimen Description   Final    BLOOD LEFT ANTECUBITAL Performed at [facility], [place] [address] [place], [place] [address]    Special Requests   Final    BOTTLES DRAWN AEROBIC AND ANAEROBIC Blood Culture adequate volume Performed at [facility], [address] [place], [place] [address]    Culture  Setup Time   Final    GRAM POSITIVE COCCI AEROBIC BOTTLE ONLY CRITICAL VALUE NOTED.  VALUE IS CONSISTENT WITH PREVIOUSLY REPORTED AND CALLED VALUE.    Culture (A)  Final    STAPHYLOCOCCUS HOMINIS SUSCEPTIBILITIES TO FOLLOW Performed at [facility], [place] [address], [place], [place] [address]    Report Status PENDING  Incomplete  Blood culture (routine x 2)     Status: None   Collection Time: 07/30/21  3:57 PM   Specimen: BLOOD RIGHT HAND  Result Value Ref Range Status   Specimen Description   Final    BLOOD RIGHT HAND Performed at [facility] [address], [place], [place] [address]    Special Requests   Final    BOTTLES DRAWN AEROBIC ONLY Blood Culture results may not be optimal due to an inadequate volume of blood received in culture bottles Performed at [facility] [address], [place], [place]  [address]    Culture   Final    NO GROWTH 5 DAYS Performed at [facility], [place] [address] [place], [place] [address]    Report Status 08/04/2021 FINAL  Final  Culture, blood (routine x 2)     Status: None (Preliminary result)   Collection Time: 08/01/21  8:54 AM   Specimen: BLOOD  Result Value Ref Range Status   Specimen Description   Final    BLOOD URH Performed at [facility] [address], [place], [place] [address]    Special Requests   Final    BOTTLES DRAWN AEROBIC AND ANAEROBIC Blood Culture adequate volume Performed at [facility] [address], [place], [place] [address]    Culture   Final    NO GROWTH 3 DAYS Performed at [facility], [place] [address], [place], [place] [address]    Report Status PENDING  Incomplete  Culture, blood (routine x 2)     Status: None (Preliminary result)   Collection Time: 08/01/21  8:54 AM   Specimen: BLOOD  Result Value Ref Range Status   Specimen Description   Final  BLOOD LRH Performed at [facility], [place] [address] [place], [place] [address]    Special Requests   Final    BOTTLES DRAWN AEROBIC ONLY Blood Culture results may not be optimal due to an inadequate volume of blood received in culture bottles Performed at [facility] [address], [place], [place] [address]    Culture   Final    NO GROWTH 3 DAYS Performed at [facility], [place] [address] [place], [place] [address]    Report Status PENDING  Incomplete     Labs: BNP (last 3 results) No results for input(s): BNP in the last 8760 hours. Basic Metabolic Panel: Recent Labs  Lab 07/31/21 0357 08/01/21 0400 08/02/21 0319 08/03/21 0310 08/04/21 0310  NA 141 140 140 136 136  K 3.6 3.5 3.6 3.6 3.4*  CL 110 110 108 109 104  CO2 22 21* '25 24 25  '$ GLUCOSE 98 90 95 92 98  BUN '23 12 10 13 16  '$ CREATININE 1.98* 1.34* 1.28* 1.45* 1.53*  CALCIUM 8.6* 8.3* 8.5* 8.2* 8.3*    Liver Function Tests: Recent Labs  Lab 07/29/21 1100 07/30/21 1003 07/31/21 0357  AST '22 23 22  '$ ALT '11 10 11  '$ ALKPHOS 56 57 45  BILITOT 0.9 1.0 0.7  PROT 8.1 7.9 6.9  ALBUMIN 4.2 4.3 3.5   No results for input(s): LIPASE, AMYLASE in the last 168 hours. Recent Labs  Lab 07/29/21 1100  AMMONIA <9*   CBC: Recent Labs  Lab 07/29/21 1100 07/30/21 1003 07/30/21 1558 07/31/21 0357 08/01/21 0400 08/02/21 0319 08/03/21 0310  WBC 11.2* 9.0 9.0 6.6 6.4 6.0 5.7  NEUTROABS 10.3* 7.6  --   --   --   --   --   HGB 12.7 12.8 12.2 11.2* 11.2* 10.5* 10.3*  HCT 37.1 37.4 37.7 33.8* 34.6* 32.4* 31.5*  MCV 77.3* 77.9* 80.2 79.5* 80.7 80.4 79.7*  PLT 305 306 271 267 265 254 252   Cardiac Enzymes: No results for input(s): CKTOTAL, CKMB, CKMBINDEX, TROPONINI in the last 168 hours. BNP: Invalid input(s): POCBNP CBG: Recent Labs  Lab 07/29/21 1007  GLUCAP 148*   D-Dimer No results for input(s): DDIMER in the last 72 hours. Hgb A1c No results for input(s): HGBA1C in the last 72 hours. Lipid Profile No results for input(s): CHOL, HDL, LDLCALC, TRIG, CHOLHDL, LDLDIRECT in the last 72 hours. Thyroid function studies No results for input(s): TSH, T4TOTAL, T3FREE, THYROIDAB in the last 72 hours.  Invalid input(s): FREET3 Anemia work up No results for input(s): VITAMINB12, FOLATE, FERRITIN, TIBC, IRON, RETICCTPCT in the last 72 hours. Urinalysis    Component Value Date/Time   COLORURINE STRAW (A) 07/29/2021 1310   APPEARANCEUR CLEAR 07/29/2021 1310   LABSPEC 1.010 07/29/2021 1310   PHURINE 7.0 07/29/2021 1310   GLUCOSEU NEGATIVE 07/29/2021 1310   HGBUR NEGATIVE 07/29/2021 1310   BILIRUBINUR NEGATIVE 07/29/2021 1310   KETONESUR NEGATIVE 07/29/2021 1310   PROTEINUR NEGATIVE 07/29/2021 1310   NITRITE NEGATIVE 07/29/2021 1310   LEUKOCYTESUR TRACE (A) 07/29/2021 1310   Sepsis Labs Invalid input(s): PROCALCITONIN,  WBC,  LACTICIDVEN Microbiology Recent Results (from the past  240 hour(s))  Blood Cultures (routine x 2)     Status: None   Collection Time: 07/29/21 11:10 AM   Specimen: BLOOD LEFT HAND  Result Value Ref Range Status   Specimen Description   Final    BLOOD LEFT HAND Performed at [facility], [address] [place], [place] [address]    Special Requests   Final    BOTTLES DRAWN AEROBIC AND ANAEROBIC Blood Culture results may not be optimal due to an inadequate volume of blood received in culture bottles Performed at [facility], [address] [place], [place] [address]    Culture   Final    NO GROWTH 5 DAYS Performed at [facility], [place] [address], [place], [place] [address]    Report Status 08/03/2021 FINAL  Final  Blood Cultures (routine x 2)     Status: Abnormal   Collection Time: 07/29/21 11:20 AM   Specimen: Right Antecubital; Blood  Result Value Ref Range Status   Specimen Description   Final    RIGHT ANTECUBITAL Performed at [facility], [address] [place], [place] [address]    Special Requests   Final    BOTTLES DRAWN AEROBIC AND ANAEROBIC Blood Culture adequate volume Performed at [facility], [place] [place], [place] [address]    Culture  Setup Time   Final    GRAM POSITIVE COCCI IN CLUSTERS IN BOTH AEROBIC AND ANAEROBIC BOTTLES CRITICAL RESULT CALLED TO, READ BACK BY AND VERIFIED WITH: RN Nelida Gores [phone] FCP Performed at [facility], [place] [address] [place], [place] [address]    Culture STAPHYLOCOCCUS HOMINIS (A)  Final   Report Status 08/02/2021 FINAL  Final   Organism ID, Bacteria STAPHYLOCOCCUS HOMINIS  Final      Susceptibility   Staphylococcus hominis - MIC*    CIPROFLOXACIN <=0.5 SENSITIVE Sensitive     ERYTHROMYCIN >=8 RESISTANT Resistant     GENTAMICIN <=0.5 SENSITIVE Sensitive     OXACILLIN <=0.25 SENSITIVE Sensitive     TETRACYCLINE <=1 SENSITIVE Sensitive     VANCOMYCIN 1 SENSITIVE Sensitive     TRIMETH/SULFA <=10 SENSITIVE  Sensitive     CLINDAMYCIN <=0.25 SENSITIVE Sensitive     RIFAMPIN <=0.5 SENSITIVE Sensitive     Inducible Clindamycin NEGATIVE Sensitive     * STAPHYLOCOCCUS HOMINIS  Blood Culture ID Panel (Reflexed)     Status: Abnormal   Collection Time: 07/29/21 11:20 AM  Result Value Ref Range Status   Enterococcus faecalis NOT DETECTED NOT DETECTED Final   Enterococcus Faecium NOT DETECTED NOT DETECTED Final   Listeria monocytogenes NOT DETECTED NOT DETECTED Final   Staphylococcus species DETECTED (A) NOT DETECTED Final    Comment: CRITICAL RESULT CALLED TO, READ BACK BY AND VERIFIED WITH: RN JAMIE B. [phone] FCP    Staphylococcus aureus (BCID) NOT DETECTED NOT DETECTED Final   Staphylococcus epidermidis NOT DETECTED NOT DETECTED Final   Staphylococcus lugdunensis NOT DETECTED NOT DETECTED Final   Streptococcus species NOT DETECTED NOT DETECTED Final   Streptococcus agalactiae NOT DETECTED NOT DETECTED Final   Streptococcus pneumoniae NOT DETECTED NOT DETECTED Final   Streptococcus pyogenes NOT DETECTED NOT DETECTED Final   A.calcoaceticus-baumannii NOT DETECTED NOT DETECTED Final   Bacteroides fragilis NOT DETECTED NOT DETECTED Final   Enterobacterales NOT DETECTED NOT DETECTED Final   Enterobacter cloacae complex NOT DETECTED NOT DETECTED Final   Escherichia coli NOT DETECTED NOT DETECTED Final   Klebsiella aerogenes NOT DETECTED NOT DETECTED Final   Klebsiella oxytoca NOT DETECTED NOT DETECTED Final   Klebsiella pneumoniae NOT DETECTED NOT DETECTED Final   Proteus species NOT DETECTED NOT DETECTED Final   Salmonella species NOT DETECTED NOT DETECTED Final   Serratia marcescens NOT DETECTED NOT  DETECTED Final   Haemophilus influenzae NOT DETECTED NOT DETECTED Final   Neisseria meningitidis NOT DETECTED NOT DETECTED Final   Pseudomonas aeruginosa NOT DETECTED NOT DETECTED Final   Stenotrophomonas maltophilia NOT DETECTED NOT DETECTED Final   Candida albicans NOT DETECTED NOT DETECTED  Final   Candida auris NOT DETECTED NOT DETECTED Final   Candida glabrata NOT DETECTED NOT DETECTED Final   Candida krusei NOT DETECTED NOT DETECTED Final   Candida parapsilosis NOT DETECTED NOT DETECTED Final   Candida tropicalis NOT DETECTED NOT DETECTED Final   Cryptococcus neoformans/gattii NOT DETECTED NOT DETECTED Final    Comment: Performed at [facility], [place] [address] [place], [place] [address]  Resp Panel by RT-PCR (Flu A&B, Covid) Nasopharyngeal Swab     Status: None   Collection Time: 07/29/21 11:55 AM   Specimen: Nasopharyngeal Swab; Nasopharyngeal(NP) swabs in vial transport medium  Result Value Ref Range Status   SARS Coronavirus 2 by RT PCR NEGATIVE NEGATIVE Final    Comment: (NOTE) SARS-CoV-2 target nucleic acids are NOT DETECTED.  The SARS-CoV-2 RNA is generally detectable in upper respiratory specimens during the acute phase of infection. The lowest concentration of SARS-CoV-2 viral copies this assay can detect is 138 copies/mL. A negative result does not preclude SARS-Cov-2 infection and should not be used as the sole basis for treatment or other patient management decisions. A negative result may occur with  improper specimen collection/handling, submission of specimen other than nasopharyngeal swab, presence of viral mutation(s) within the areas targeted by this assay, and inadequate number of viral copies(<138 copies/mL). A negative result must be combined with clinical observations, patient history, and epidemiological information. The expected result is Negative.  Fact Sheet for Patients:  EntrepreneurPulse.com.au  Fact Sheet for Healthcare Providers:  IncredibleEmployment.be  This test is no t yet approved or cleared by the [place] FDA and  has been authorized for detection and/or diagnosis of SARS-CoV-2 by FDA under an Emergency Use Authorization (EUA). This EUA will remain  in effect (meaning this test  can be used) for the duration of the COVID-19 declaration under Section 564(b)(1) of the Act, 21 U.S.C.section 360bbb-3(b)(1), unless the authorization is terminated  or revoked sooner.       Influenza A by PCR NEGATIVE NEGATIVE Final   Influenza B by PCR NEGATIVE NEGATIVE Final    Comment: (NOTE) The Xpert Xpress SARS-CoV-2/FLU/RSV plus assay is intended as an aid in the diagnosis of influenza from Nasopharyngeal swab specimens and should not be used as a sole basis for treatment. Nasal washings and aspirates are unacceptable for Xpert Xpress SARS-CoV-2/FLU/RSV testing.  Fact Sheet for Patients: EntrepreneurPulse.com.au  Fact Sheet for Healthcare Providers: IncredibleEmployment.be  This test is not yet approved or cleared by the [place] FDA and has been authorized for detection and/or diagnosis of SARS-CoV-2 by FDA under an Emergency Use Authorization (EUA). This EUA will remain in effect (meaning this test can be used) for the duration of the COVID-19 declaration under Section 564(b)(1) of the Act, 21 U.S.C. section 360bbb-3(b)(1), unless the authorization is terminated or revoked.  Performed at [facility], [address] [place], [place] [address]   Blood culture (routine x 2)     Status: Abnormal (Preliminary result)   Collection Time: 07/30/21 11:12 AM   Specimen: BLOOD  Result Value Ref Range Status   Specimen Description   Final    BLOOD LEFT ANTECUBITAL Performed at [facility], [place],  [place] [address]    Special Requests   Final    BOTTLES DRAWN AEROBIC AND ANAEROBIC Blood Culture adequate volume Performed at [facility], [address] [place], [place] [address]    Culture  Setup Time   Final    GRAM POSITIVE COCCI AEROBIC BOTTLE ONLY CRITICAL VALUE NOTED.  VALUE IS CONSISTENT WITH PREVIOUSLY REPORTED AND CALLED VALUE.    Culture (A)  Final    STAPHYLOCOCCUS  HOMINIS SUSCEPTIBILITIES TO FOLLOW Performed at [facility], [place] [address] [place], [place] [address]    Report Status PENDING  Incomplete  Blood culture (routine x 2)     Status: None   Collection Time: 07/30/21  3:57 PM   Specimen: BLOOD RIGHT HAND  Result Value Ref Range Status   Specimen Description   Final    BLOOD RIGHT HAND Performed at [facility] [address] [place], [place] [address]    Special Requests   Final    BOTTLES DRAWN AEROBIC ONLY Blood Culture results may not be optimal due to an inadequate volume of blood received in culture bottles Performed at [facility] [address] [place], [place] [address]    Culture   Final    NO GROWTH 5 DAYS Performed at [facility], [place] [address] [place], [place] [address]    Report Status 08/04/2021 FINAL  Final  Culture, blood (routine x 2)     Status: None (Preliminary result)   Collection Time: 08/01/21  8:54 AM   Specimen: BLOOD  Result Value Ref Range Status   Specimen Description   Final    BLOOD URH Performed at [facility] [address] [place], [place] [address]    Special Requests   Final    BOTTLES DRAWN AEROBIC AND ANAEROBIC Blood Culture adequate volume Performed at [facility] [address], [place], [place] [address]    Culture   Final    NO GROWTH 3 DAYS Performed at [facility], [place] [address] [place], [place] [address]    Report Status PENDING  Incomplete  Culture, blood (routine x 2)     Status: None (Preliminary result)   Collection Time: 08/01/21  8:54 AM   Specimen: BLOOD  Result Value Ref Range Status   Specimen Description   Final    BLOOD LRH Performed at [facility] [address], [place], [place] [address]    Special Requests   Final    BOTTLES DRAWN AEROBIC ONLY Blood Culture results may not be optimal due to an inadequate volume of blood received in  culture bottles Performed at [facility] [address], [place], [place] [address]    Culture   Final    NO GROWTH 3 DAYS Performed at [facility], [place] [address], [place], [place] [address]    Report Status PENDING  Incomplete     Patient was seen and examined on the day of discharge and was found to be in stable condition. Time coordinating discharge: 25 minutes including assessment and coordination of care, as well as examination of the patient.   SIGNED:  Dessa Phi, DO [facility] 08/04/2021, 7:11 PM

## 2021-08-04 NOTE — Progress Notes (Signed)
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

## 2021-08-04 NOTE — Progress Notes (Addendum)
PROGRESS NOTE    Sherry Perez  [id number] DOB: [birth year] DOA: 07/30/2021 PCP: [name], NP     Brief Narrative:  Sherry Perez is a 80 y.o. female with medical history significant for advanced dementia, hypertension, hyperlipidemia, controlled diabetes who was seen in the ED on 8/10 with multiple complaints, not feeling well, headache, myalgia, diarrhea, had work-up in the ED that was not impressive blood cultures were drawn and patient was discharged home.  Then 2 out of 4 blood cultures tested positive for Staphylococcus was called back to the ED and transferred to [facility] for admission.  Infectious disease was consulted.  Repeat cultures obtained.  New events last 24 hours / Subjective: No new issues, BP better. I spoke with [facility], they expect susceptibility from blood culture on 8/11 to be available tomorrow.   Assessment & Plan:   Principal Problem:   Bacterial infection due to Staphylococcus Active Problems:   Diabetes type 2, controlled ([place])   HTN (hypertension)   Hyperlipidemia   Dementia (HCC)   Bacteremia   AKI (acute kidney injury) ([place])   Staph bacteremia -Blood culture obtained 8/10 showing staph hominis -Blood culture obtained 8/11 showing staph hominis, susceptibilities pending  -Repeat blood culture 8/13 negative to date  -Echocardiogram without vegetation seen -Vanco --> Zyvox  -Infectious disease following  AKI on CKD stage IIIb -Baseline creatinine around 1.4-1.5 -Renal US consistent with medical renal disease -Resolved but Cr creeping up again, hold lasix   Hypertension -Continue hydralazine, metoprolol, Norvasc, Catapres   Hyperlipidemia -Continue Lipitor  Advanced dementia -Pleasantly demented, continue Namenda  Hypokalemia -Replace    DVT prophylaxis:  enoxaparin (LOVENOX) injection 40 mg Start: 08/01/21 2200 SCDs Start: 07/30/21 1721  Code Status:     Code Status Orders  (From admission, onward)            Start     Ordered   07/30/21 1721  Full code  Continuous        07/30/21 1722           Code Status History     This patient has a current code status but no historical code status.      Advance Directive Documentation    Flowsheet Row Most Recent Value  Type of Advance Directive Healthcare Power of Attorney, Living will  Pre-existing out of facility DNR order (yellow form or pink MOST form) --  "MOST" Form in Place? --      Family Communication: Daughter at bedside Disposition Plan:  Status is: Inpatient  Remains inpatient appropriate because:Inpatient level of care appropriate due to severity of illness  Dispo: The patient is from: Home              Anticipated d/c is to: Home              Patient currently is not medically stable to d/c.   Difficult to place patient No      Consultants:  Infectious disease  Procedures:  None   Antimicrobials:  Anti-infectives (From admission, onward)    Start     Dose/Rate Route Frequency Ordered Stop   08/03/21 1200  linezolid (ZYVOX) tablet 600 mg        600 mg Oral Every 12 hours 08/03/21 1030 08/06/21 0959   08/02/21 1200  vancomycin (VANCOREADY) IVPB 750 mg/150 mL  Status:  Discontinued        750 mg 150 mL/hr over 60 Minutes Intravenous Every 24 hours 08/01/21 1611 08/03/21 1030  08/01/21 1000  vancomycin (VANCOCIN) IVPB 1000 mg/200 mL premix  Status:  Discontinued        1,000 mg 200 mL/hr over 60 Minutes Intravenous Every 48 hours 07/30/21 1133 07/31/21 1120   08/01/21 1000  vancomycin (VANCOREADY) IVPB 1250 mg/250 mL  Status:  Discontinued        1,250 mg 166.7 mL/hr over 90 Minutes Intravenous Every 48 hours 07/31/21 1120 08/01/21 1611   07/30/21 1115  vancomycin (VANCOCIN) IVPB 1000 mg/200 mL premix        1,000 mg 200 mL/hr over 60 Minutes Intravenous  Once 07/30/21 1107 07/30/21 1404   07/30/21 1100  cefTRIAXone (ROCEPHIN) 2 g in sodium chloride 0.9 % 100 mL IVPB        2 g 200 mL/hr over 30 Minutes  Intravenous  Once 07/30/21 1056 07/30/21 1215        Objective: Vitals:   08/03/21 2102 08/04/21 0127 08/04/21 0454 08/04/21 1346  BP: (!) 167/60 (!) 136/50 (!) 137/51 (!) 125/51  Pulse: (!) 51 (!) 58 (!) 56 (!) 49  Resp: '16 18 16 17  '$ Temp: 98 F (36.7 C)  98.6 F (37 C) 98.2 F (36.8 C)  TempSrc: Oral  Oral Oral  SpO2: 100% 99% 100% 97%  Weight:      Height:        Intake/Output Summary (Last 24 hours) at 08/04/2021 1441 Last data filed at 08/04/2021 1005 Gross per 24 hour  Intake 660 ml  Output 1500 ml  Net -840 ml    Filed Weights   07/30/21 1000  Weight: 78.5 kg   Examination: General exam: Appears calm and comfortable  Respiratory system: Clear to auscultation. Respiratory effort normal. Cardiovascular system: S1 & S2 heard, RRR. No pedal edema. Gastrointestinal system: Abdomen is nondistended, soft and nontender. Normal bowel sounds heard. Central nervous system: Alert. Non focal exam.  Extremities: Symmetric in appearance bilaterally  Skin: No rashes, lesions or ulcers on exposed skin  Psychiatry: Stable    Data Reviewed: I have personally reviewed following labs and imaging studies  CBC: Recent Labs  Lab 07/29/21 1100 07/30/21 1003 07/30/21 1558 07/31/21 0357 08/01/21 0400 08/02/21 0319 08/03/21 0310  WBC 11.2* 9.0 9.0 6.6 6.4 6.0 5.7  NEUTROABS 10.3* 7.6  --   --   --   --   --   HGB 12.7 12.8 12.2 11.2* 11.2* 10.5* 10.3*  HCT 37.1 37.4 37.7 33.8* 34.6* 32.4* 31.5*  MCV 77.3* 77.9* 80.2 79.5* 80.7 80.4 79.7*  PLT 305 306 271 267 265 254 [id number]    Basic Metabolic Panel: Recent Labs  Lab 07/31/21 0357 08/01/21 0400 08/02/21 0319 08/03/21 0310 08/04/21 0310  NA 141 140 140 136 136  K 3.6 3.5 3.6 3.6 3.4*  CL 110 110 108 109 104  CO2 22 21* '25 24 25  '$ GLUCOSE 98 90 95 92 98  BUN '23 12 10 13 16  '$ CREATININE 1.98* 1.34* 1.28* 1.45* 1.53*  CALCIUM 8.6* 8.3* 8.5* 8.2* 8.3*    GFR: Estimated Creatinine Clearance: 30.6 mL/min (A) (by C-G  formula based on SCr of 1.53 mg/dL (H)). Liver Function Tests: Recent Labs  Lab 07/29/21 1100 07/30/21 1003 07/31/21 0357  AST '22 23 22  '$ ALT '11 10 11  '$ ALKPHOS 56 57 45  BILITOT 0.9 1.0 0.7  PROT 8.1 7.9 6.9  ALBUMIN 4.2 4.3 3.5    No results for input(s): LIPASE, AMYLASE in the last 168 hours. Recent Labs  Lab 07/29/21 1100  AMMONIA <9*    Coagulation Profile: Recent Labs  Lab 07/31/21 0357  INR 1.3*    Cardiac Enzymes: No results for input(s): CKTOTAL, CKMB, CKMBINDEX, TROPONINI in the last 168 hours. BNP (last 3 results) No results for input(s): PROBNP in the last 8760 hours. HbA1C: No results for input(s): HGBA1C in the last 72 hours. CBG: Recent Labs  Lab 07/29/21 1007  GLUCAP 148*    Lipid Profile: No results for input(s): CHOL, HDL, LDLCALC, TRIG, CHOLHDL, LDLDIRECT in the last 72 hours. Thyroid Function Tests: No results for input(s): TSH, T4TOTAL, FREET4, T3FREE, THYROIDAB in the last 72 hours. Anemia Panel: No results for input(s): VITAMINB12, FOLATE, FERRITIN, TIBC, IRON, RETICCTPCT in the last 72 hours. Sepsis Labs: Recent Labs  Lab 07/29/21 1100 07/30/21 1003 07/30/21 1558 07/31/21 0357 08/01/21 0400  PROCALCITON  --   --  0.21 0.19 <0.10  LATICACIDVEN 1.7 2.6* 1.3  --   --      Recent Results (from the past 240 hour(s))  Blood Cultures (routine x 2)     Status: None   Collection Time: 07/29/21 11:10 AM   Specimen: BLOOD LEFT HAND  Result Value Ref Range Status   Specimen Description   Final    BLOOD LEFT HAND Performed at [facility], [address] [place], [place] [address]    Special Requests   Final    BOTTLES DRAWN AEROBIC AND ANAEROBIC Blood Culture results may not be optimal due to an inadequate volume of blood received in culture bottles Performed at [facility], [address] [place], [place] [address]    Culture   Final    NO GROWTH 5 DAYS Performed at [facility], [place] [address], [place], [place] [address]    Report Status 08/03/2021 FINAL  Final  Blood Cultures (routine x 2)     Status: Abnormal   Collection Time: 07/29/21 11:20 AM   Specimen: Right Antecubital; Blood  Result Value Ref Range Status   Specimen Description   Final    RIGHT ANTECUBITAL Performed at [facility], [address] [place], [place] [address]    Special Requests   Final    BOTTLES DRAWN AEROBIC AND ANAEROBIC Blood Culture adequate volume Performed at [facility], [address] [place], [place] [address]    Culture  Setup Time   Final    GRAM POSITIVE COCCI IN CLUSTERS IN BOTH AEROBIC AND ANAEROBIC BOTTLES CRITICAL RESULT CALLED TO, READ BACK BY AND VERIFIED WITH: RN Nelida Gores [phone] FCP Performed at [facility], [place] [address] [place], [place] [address]    Culture STAPHYLOCOCCUS HOMINIS (A)  Final   Report Status 08/02/2021 FINAL  Final   Organism ID, Bacteria STAPHYLOCOCCUS HOMINIS  Final      Susceptibility   Staphylococcus hominis - MIC*    CIPROFLOXACIN <=0.5 SENSITIVE Sensitive     ERYTHROMYCIN >=8 RESISTANT Resistant     GENTAMICIN <=0.5 SENSITIVE Sensitive     OXACILLIN <=0.25 SENSITIVE Sensitive     TETRACYCLINE <=1 SENSITIVE Sensitive     VANCOMYCIN 1 SENSITIVE Sensitive     TRIMETH/SULFA <=10 SENSITIVE Sensitive     CLINDAMYCIN <=0.25 SENSITIVE Sensitive     RIFAMPIN <=0.5 SENSITIVE Sensitive     Inducible Clindamycin NEGATIVE Sensitive     * STAPHYLOCOCCUS HOMINIS  Blood Culture ID Panel (Reflexed)     Status: Abnormal   Collection Time: 07/29/21  11:20 AM  Result Value Ref Range Status   Enterococcus faecalis NOT DETECTED NOT DETECTED Final   Enterococcus Faecium NOT DETECTED NOT DETECTED Final   Listeria monocytogenes NOT DETECTED NOT DETECTED Final   Staphylococcus species DETECTED (A) NOT DETECTED Final    Comment: CRITICAL RESULT CALLED TO, READ BACK BY AND VERIFIED WITH: RN JAMIE B. 0845 [phone] FCP    Staphylococcus  aureus (BCID) NOT DETECTED NOT DETECTED Final   Staphylococcus epidermidis NOT DETECTED NOT DETECTED Final   Staphylococcus lugdunensis NOT DETECTED NOT DETECTED Final   Streptococcus species NOT DETECTED NOT DETECTED Final   Streptococcus agalactiae NOT DETECTED NOT DETECTED Final   Streptococcus pneumoniae NOT DETECTED NOT DETECTED Final   Streptococcus pyogenes NOT DETECTED NOT DETECTED Final   A.calcoaceticus-baumannii NOT DETECTED NOT DETECTED Final   Bacteroides fragilis NOT DETECTED NOT DETECTED Final   Enterobacterales NOT DETECTED NOT DETECTED Final   Enterobacter cloacae complex NOT DETECTED NOT DETECTED Final   Escherichia coli NOT DETECTED NOT DETECTED Final   Klebsiella aerogenes NOT DETECTED NOT DETECTED Final   Klebsiella oxytoca NOT DETECTED NOT DETECTED Final   Klebsiella pneumoniae NOT DETECTED NOT DETECTED Final   Proteus species NOT DETECTED NOT DETECTED Final   Salmonella species NOT DETECTED NOT DETECTED Final   Serratia marcescens NOT DETECTED NOT DETECTED Final   Haemophilus influenzae NOT DETECTED NOT DETECTED Final   Neisseria meningitidis NOT DETECTED NOT DETECTED Final   Pseudomonas aeruginosa NOT DETECTED NOT DETECTED Final   Stenotrophomonas maltophilia NOT DETECTED NOT DETECTED Final   Candida albicans NOT DETECTED NOT DETECTED Final   Candida auris NOT DETECTED NOT DETECTED Final   Candida glabrata NOT DETECTED NOT DETECTED Final   Candida krusei NOT DETECTED NOT DETECTED Final   Candida parapsilosis NOT DETECTED NOT DETECTED Final   Candida tropicalis NOT DETECTED NOT DETECTED Final   Cryptococcus neoformans/gattii NOT DETECTED NOT DETECTED Final    Comment: Performed at [facility], [address], [place], [place] [address]  Resp Panel by RT-PCR (Flu A&B, Covid) Nasopharyngeal Swab     Status: None   Collection Time: 07/29/21 11:55 AM   Specimen: Nasopharyngeal Swab; Nasopharyngeal(NP) swabs in vial transport medium  Result Value Ref Range  Status   SARS Coronavirus 2 by RT PCR NEGATIVE NEGATIVE Final    Comment: (NOTE) SARS-CoV-2 target nucleic acids are NOT DETECTED.  The SARS-CoV-2 RNA is generally detectable in upper respiratory specimens during the acute phase of infection. The lowest concentration of SARS-CoV-2 viral copies this assay can detect is 138 copies/mL. A negative result does not preclude SARS-Cov-2 infection and should not be used as the sole basis for treatment or other patient management decisions. A negative result may occur with  improper specimen collection/handling, submission of specimen other than nasopharyngeal swab, presence of viral mutation(s) within the areas targeted by this assay, and inadequate number of viral copies(<138 copies/mL). A negative result must be combined with clinical observations, patient history, and epidemiological information. The expected result is Negative.  Fact Sheet for Patients:  EntrepreneurPulse.com.au  Fact Sheet for Healthcare Providers:  IncredibleEmployment.be  This test is no t yet approved or cleared by the [place] FDA and  has been authorized for detection and/or diagnosis of SARS-CoV-2 by FDA under an Emergency Use Authorization (EUA). This EUA will remain  in effect (meaning this test can be used) for the duration of the COVID-19 declaration under Section 564(b)(1) of the Act, 21 U.S.C.section 360bbb-3(b)(1), unless the authorization is terminated  or revoked sooner.       Influenza A by PCR NEGATIVE NEGATIVE Final   Influenza B by PCR NEGATIVE NEGATIVE Final    Comment: (NOTE) The Xpert Xpress SARS-CoV-2/FLU/RSV plus assay is intended as an aid in the diagnosis of influenza from Nasopharyngeal swab specimens and should not be used as a sole basis for treatment. Nasal washings and aspirates are unacceptable for Xpert Xpress SARS-CoV-2/FLU/RSV testing.  Fact Sheet for  Patients: EntrepreneurPulse.com.au  Fact Sheet for Healthcare Providers: IncredibleEmployment.be  This test is not yet approved or cleared by the [place] FDA and has been authorized for detection and/or diagnosis of SARS-CoV-2 by FDA under an Emergency Use Authorization (EUA). This EUA will remain in effect (meaning this test can be used) for the duration of the COVID-19 declaration under Section 564(b)(1) of the Act, 21 U.S.C. section 360bbb-3(b)(1), unless the authorization is terminated or revoked.  Performed at [facility], [address] [place], [place] [address]   Blood culture (routine x 2)     Status: Abnormal (Preliminary result)   Collection Time: 07/30/21 11:12 AM   Specimen: BLOOD  Result Value Ref Range Status   Specimen Description   Final    BLOOD LEFT ANTECUBITAL Performed at [facility], [place] [address], [place], [place] [address]    Special Requests   Final    BOTTLES DRAWN AEROBIC AND ANAEROBIC Blood Culture adequate volume Performed at [facility], [address] [place], [place] [address]    Culture  Setup Time   Final    GRAM POSITIVE COCCI AEROBIC BOTTLE ONLY CRITICAL VALUE NOTED.  VALUE IS CONSISTENT WITH PREVIOUSLY REPORTED AND CALLED VALUE.    Culture (A)  Final    STAPHYLOCOCCUS HOMINIS SUSCEPTIBILITIES TO FOLLOW Performed at [facility], [place] [address], [place], [place] [address]    Report Status PENDING  Incomplete  Blood culture (routine x 2)     Status: None (Preliminary result)   Collection Time: 07/30/21  3:57 PM   Specimen: BLOOD RIGHT HAND  Result Value Ref Range Status   Specimen Description   Final    BLOOD RIGHT HAND Performed at [facility] [address], [place], [place] [address]    Special Requests   Final    BOTTLES DRAWN AEROBIC ONLY Blood Culture results may not be optimal due to an inadequate volume of blood received in  culture bottles Performed at [facility] [address] [place], [place] [address]    Culture   Final    NO GROWTH 4 DAYS Performed at [facility], [place] [address], [place], [place] [address]    Report Status PENDING  Incomplete  Culture, blood (routine x 2)     Status: None (Preliminary result)   Collection Time: 08/01/21  8:54 AM   Specimen: BLOOD  Result Value Ref Range Status   Specimen Description   Final    BLOOD URH Performed at [place] [address] [place], [place] [address]    Special Requests   Final    BOTTLES DRAWN AEROBIC AND ANAEROBIC Blood Culture adequate volume Performed at [facility] [address], [place], [place] [address]    Culture   Final    NO GROWTH 2 DAYS Performed at [facility] [address] [place], [place] [address]    Report Status PENDING  Incomplete  Culture, blood (routine  x 2)     Status: None (Preliminary result)   Collection Time: 08/01/21  8:54 AM   Specimen: BLOOD  Result Value Ref Range Status   Specimen Description   Final    BLOOD LRH Performed at [facility] [address] [place], [place] [address]    Special Requests   Final    BOTTLES DRAWN AEROBIC ONLY Blood Culture results may not be optimal due to an inadequate volume of blood received in culture bottles Performed at [facility] [address], [place], [place] [address]    Culture   Final    NO GROWTH 2 DAYS Performed at [facility] [address], [place], [place] [address]    Report Status PENDING  Incomplete       Radiology Studies: No results found.    Scheduled Meds:  amLODipine  10 mg Oral Daily   atorvastatin  80 mg Oral Daily   cholecalciferol  1,000 Units Oral Daily   cloNIDine  0.1 mg Oral Daily   enoxaparin (LOVENOX) injection  40 mg Subcutaneous Q24H   hydrALAZINE  50 mg Oral TID   linezolid  600 mg Oral Q12H    memantine  10 mg Oral QHS   metoprolol succinate  100 mg Oral Daily   sodium chloride flush  3 mL Intravenous Q12H   Continuous Infusions:     LOS: 5 days      Time spent: 20 minutes   Dessa Phi, DO [facility] 08/04/2021, 2:41 PM   Available via Epic secure chat 7am-7pm After these hours, please refer to coverage provider listed on [email or web address]

## 2021-08-04 NOTE — Progress Notes (Signed)
Occupational Therapy Treatment Patient Details Name: Sherry Perez MRN: [id number] DOB: 1941/10/28 Today's Date: 08/04/2021    History of present illness Sherry Perez is a 80 y.o. female with medical history significant for advanced dementia, hypertension, hyperlipidemia, controlled diabetes who was seen in the ED on 8/10 with multiple complaints, not feeling well, headache, myalgia, diarrhea, had work-up in the ED that was not impressive blood cultures were drawn and patient was discharged home.  Then 2 out of 4 blood cultures tested positive for Staphylococcus was called back to the ED and transferred to [facility] for admission.   OT comments  Patient wanting to use toilet, pure wick leaked and bed soiled with urine. Patient modified independent with bed mobility, supervision to power up to standing. Initial unsteadiness as this is patient's first time up this AM, hand held assist to bathroom. Did not need physical assist on/off toilet or for peri care post voiding. Supervision at sink for hand hygiene and progress to min G assist for safety without hand held assist to ambulate back to bed. Patient appears close to baseline with mobility/self care. Patient/family member hopeful she will get to go home today.    Follow Up Recommendations  No OT follow up    Equipment Recommendations  Tub/shower seat       Precautions / Restrictions Precautions Precautions: Fall       Mobility Bed Mobility Overal bed mobility: Modified Independent             General bed mobility comments: Increased time. Partially elevated HOB.    Transfers Overall transfer level: Needs assistance Equipment used: None Transfers: Sit to/from Stand Sit to Stand: Supervision         General transfer comment: did not need physical assistance to power up to standing from edge of bed or from toilet, initial hand held assist with ambulation and progressed to min G without HHA    Balance Overall balance  assessment: Mild deficits observed, not formally tested                                         ADL either performed or assessed with clinical judgement   ADL Overall ADL's : Needs assistance/impaired     Grooming: Wash/dry hands;Supervision/safety;Standing                   Toilet Transfer: Minimal assistance;Min guard;Ambulation;Regular Glass blower/designer Details (indicate cue type and reason): initially patient hand held assistance to ambulate to bathroom, first time up this AM. Progress to min G without hand held assist to ambulate back to bed. did not need any physical assistance on/off toilet just increased time Toileting- Clothing Manipulation and Hygiene: Independent;Sitting/lateral lean;Sit to/from stand       Functional mobility during ADLs: Min guard;Minimal assistance General ADL Comments: patient and family member reporting hopeful to go home today                       Cognition Arousal/Alertness: Awake/alert Behavior During Therapy: WFL for tasks assessed/performed Overall Cognitive Status: History of cognitive impairments - at baseline                                 General Comments: Baseline memory impairments. Pt pleasant and cooperative. Follows 1-2 step instructions well.  Pertinent Vitals/ Pain       Pain Assessment: No/denies pain                                                Frequency  Min 2X/week        Progress Toward Goals  OT Goals(current goals can now be found in the care plan section)  Progress towards OT goals: Progressing toward goals  Acute Rehab OT Goals Patient Stated Goal: Take a shower OT Goal Formulation: With patient/family Time For Goal Achievement: 08/15/21 Potential to Achieve Goals: Good ADL Goals Pt Will Perform Grooming: standing;with supervision Pt Will Perform Upper Body Bathing: standing;with supervision Pt  Will Perform Lower Body Bathing: sitting/lateral leans;sit to/from stand;with supervision Pt Will Perform Lower Body Dressing: with supervision;sit to/from stand Pt Will Transfer to Toilet: with modified independence;ambulating Pt Will Perform Toileting - Clothing Manipulation and hygiene: with modified independence;sitting/lateral leans;sit to/from stand  Plan Discharge plan remains appropriate       AM-PAC OT "6 Clicks" Daily Activity     Outcome Measure   Help from another person eating meals?: None Help from another person taking care of personal grooming?: A Little Help from another person toileting, which includes using toliet, bedpan, or urinal?: A Little Help from another person bathing (including washing, rinsing, drying)?: A Little Help from another person to put on and taking off regular upper body clothing?: A Little Help from another person to put on and taking off regular lower body clothing?: A Little 6 Click Score: 19    End of Session   OT Visit Diagnosis: Unsteadiness on feet (R26.81)   Activity Tolerance Patient tolerated treatment well   Patient Left in bed;with call bell/phone within reach;with family/visitor present   Nurse Communication Mobility status        Time: [id number] OT Time Calculation (min): 15 min  Charges: OT General Charges $OT Visit: 1 Visit OT Treatments $Self Care/Home Management : 8-22 mins  Delbert Phenix OT OT pager: [place] 08/04/2021, 9:54 AM

## 2021-08-05 LAB — CULTURE, BLOOD (ROUTINE X 2): Special Requests: ADEQUATE

## 2021-08-06 ENCOUNTER — Telehealth: Payer: Self-pay

## 2021-08-06 ENCOUNTER — Encounter: Payer: Self-pay | Admitting: Family

## 2021-08-06 LAB — CULTURE, BLOOD (ROUTINE X 2)
Culture: NO GROWTH
Culture: NO GROWTH
Special Requests: ADEQUATE

## 2021-08-06 NOTE — Telephone Encounter (Signed)
Transition Care Management Unsuccessful Follow-up Telephone Call  Date of discharge and from where:  08/04/21 from [facility]   Attempts:  1st Attempt  Reason for unsuccessful TCM follow-up call:  Unable to leave message. Will follow.

## 2021-08-07 NOTE — Telephone Encounter (Signed)
Transition Care Management Unsuccessful Follow-up Telephone Call  Date of discharge and from where:  08/04/21 from [facility]  Attempts:  2nd Attempt  Reason for unsuccessful TCM follow-up call:  Unable to reach patient

## 2021-08-14 ENCOUNTER — Encounter: Payer: Self-pay | Admitting: Family

## 2021-08-16 ENCOUNTER — Other Ambulatory Visit: Payer: Self-pay | Admitting: Family

## 2021-08-18 ENCOUNTER — Ambulatory Visit (INDEPENDENT_AMBULATORY_CARE_PROVIDER_SITE_OTHER): Payer: Medicare HMO | Admitting: Family

## 2021-08-18 ENCOUNTER — Other Ambulatory Visit: Payer: Self-pay

## 2021-08-18 VITALS — BP 161/61 | HR 81 | Temp 98.7°F | Resp 16 | Wt 171.0 lb

## 2021-08-18 DIAGNOSIS — N179 Acute kidney failure, unspecified: Secondary | ICD-10-CM

## 2021-08-18 DIAGNOSIS — Z23 Encounter for immunization: Secondary | ICD-10-CM | POA: Diagnosis not present

## 2021-08-18 DIAGNOSIS — R7881 Bacteremia: Secondary | ICD-10-CM

## 2021-08-18 DIAGNOSIS — I10 Essential (primary) hypertension: Secondary | ICD-10-CM

## 2021-08-18 DIAGNOSIS — E782 Mixed hyperlipidemia: Secondary | ICD-10-CM

## 2021-08-18 DIAGNOSIS — R69 Illness, unspecified: Secondary | ICD-10-CM | POA: Diagnosis not present

## 2021-08-18 DIAGNOSIS — F0390 Unspecified dementia without behavioral disturbance: Secondary | ICD-10-CM

## 2021-08-18 DIAGNOSIS — D509 Iron deficiency anemia, unspecified: Secondary | ICD-10-CM

## 2021-08-18 DIAGNOSIS — B958 Unspecified staphylococcus as the cause of diseases classified elsewhere: Secondary | ICD-10-CM

## 2021-08-18 NOTE — Assessment & Plan Note (Signed)
Obtain follow up CMET.

## 2021-08-18 NOTE — Assessment & Plan Note (Signed)
Resolved. Monitor.  

## 2021-08-18 NOTE — Assessment & Plan Note (Signed)
--  Continue lipitor 80mg  ?

## 2021-08-18 NOTE — Patient Instructions (Signed)
Please complete lab work prior to leaving.   

## 2021-08-18 NOTE — Assessment & Plan Note (Addendum)
The BP readings from her hospitalizations looked better than the readings we typically have in the office.  I suspect that there are some compliance issue with her memory.   Addendum: spoke with daughter and she has not been giving patient hydralazine. Advised her to begin TID.

## 2021-08-18 NOTE — Assessment & Plan Note (Signed)
Stable, unchanged. Continue namenda 10 mg.

## 2021-08-18 NOTE — Progress Notes (Signed)
Subjective:     Patient ID: Sherry Perez, female    DOB: 04/11/1941, 80 y.o.   MRN: [id number]  Chief Complaint  Patient presents with   Follow-up    Here for hospital follow up    HPI Patient is in today for hospital follow up. She presented to the ED on 8/10 with malaise, HA, Myalgia, and diarrhea. She had blood cultures drawn and was sent home.  Subsequently, 2 out of 4 blood cultures tested positive for staph.  She was called back to the ED for admission.  She was initially treated with IV vanc an ultimately Zyvox. TTE was performed and was negative for vegetation. ID was consulted during her admission.   Denies fevers or diarrhea.  Energy is improving.  HTN- She reports good compliance with medication.   BP Readings from Last 3 Encounters:  08/18/21 (!) 161/61  08/04/21 (!) 125/51  07/29/21 (!) 156/74   Hyperlipidemia- maintained on lipitor.  Lab Results  Component Value Date   CHOL 198 10/10/2020   HDL 79 10/10/2020   LDLCALC 104 (H) 10/10/2020   TRIG 64 10/10/2020   CHOLHDL 2.5 10/10/2020     Health Maintenance Due  Topic Date Due   OPHTHALMOLOGY EXAM  02/17/2017   HEMOGLOBIN A1C  04/10/2021   Zoster Vaccines- Shingrix (2 of 2) 08/05/2021    Past Medical History:  Diagnosis Date   Diabetes mellitus without complication (HCC)    HTN (hypertension)    Hyperlipidemia     Past Surgical History:  Procedure Laterality Date   ABDOMINAL HYSTERECTOMY     cataract surgery Left 02/04/2021    Family History  Problem Relation Age of Onset   Diabetes Mother    Diabetes Brother     Social History   Socioeconomic History   Marital status: Widowed    Spouse name: Not on file   Number of children: Not on file   Years of education: Not on file   Highest education level: Not on file  Occupational History   Not on file  Tobacco Use   Smoking status: Never   Smokeless tobacco: Never  Vaping Use   Vaping Use: Never used  Substance and Sexual Activity    Alcohol use: No    Alcohol/week: 0.0 standard drinks   Drug use: Never   Sexual activity: Not on file  Other Topics Concern   Not on file  Social History Narrative   4 children (3 daughters 1 son)- all local   7 grandchildren    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)   Widowed (husband passed 2016)   Lives with [name]   Enjoys cross word puzzle, adult coloring books, reads bible   Right handed    Social Determinants of Health   Financial Resource Strain: Low Risk    Difficulty of Paying Living Expenses: Not hard at all  Food Insecurity: No Food Insecurity   Worried About Charity fundraiser in the Last Year: Never true   [place] in the Last Year: Never true  Transportation Needs: No Transportation Needs   Lack of Transportation (Medical): No   Lack of Transportation (Non-Medical): No  Physical Activity: Inactive   Days of Exercise per Week: 0 days   Minutes of Exercise per Session: 0 min  Stress: No Stress Concern Present   Feeling of Stress : Not at all  Social Connections: Moderately Isolated   Frequency of Communication with Friends and Family: More  than three times a week   Frequency of Social Gatherings with Friends and Family: More than three times a week   Attends Religious Services: More than 4 times per year   Active Member of Clubs or Organizations: No   Attends Archivist Meetings: Never   Marital Status: Widowed  Human resources officer Violence: Not At Risk   Fear of Current or Ex-Partner: No   Emotionally Abused: No   Physically Abused: No   Sexually Abused: No    Outpatient Medications Prior to Visit  Medication Sig Dispense Refill   acetaminophen (TYLENOL) 500 MG tablet Take 2 tablets (1,000 mg total) by mouth 2 (two) times daily. 30 tablet 0   amLODipine (NORVASC) 10 MG tablet TAKE ONE TABLET BY MOUTH ONE TIME DAILY (Patient taking differently: Take 10 mg by mouth daily.) 90 tablet 1   atorvastatin (LIPITOR) 80 MG tablet TAKE ONE  TABLET BY MOUTH ONE TIME DAILY (Patient taking differently: Take 80 mg by mouth daily.) 90 tablet 1   cloNIDine (CATAPRES) 0.1 MG tablet Take 1 tablet (0.1 mg total) by mouth daily. 30 tablet 2   Cyanocobalamin (VITAMIN B12 PO) Take 1 tablet by mouth daily.     furosemide (LASIX) 40 MG tablet TAKE ONE TABLET BY MOUTH ONE TIME DAILY (Patient taking differently: Take 40 mg by mouth daily.) 90 tablet 1   hydrALAZINE (APRESOLINE) 50 MG tablet Take 1 tablet (50 mg total) by mouth 3 (three) times daily. 90 tablet 2   memantine (NAMENDA) 10 MG tablet Take 1 tablet (10 mg at night) for 2 weeks, then increase to 1 tablet (10 mg) twice a day (Patient taking differently: Take 10 mg by mouth 2 (two) times daily.) 60 tablet 11   metoprolol succinate (TOPROL-XL) 100 MG 24 hr tablet TAKE ONE TABLET BY MOUTH ONE TIME DAILY TAKE WITH OR IMMEDIATELY FOLLOWING A MEAL 90 tablet 1   potassium chloride SA (KLOR-CON) 20 MEQ tablet TAKE ONE-HALF TABLET BY MOUTH ONE TIME DAILY (Patient taking differently: Take 10 mEq by mouth daily.) 45 tablet 1   traMADol (ULTRAM) 50 MG tablet Take 1 tablet (50 mg total) by mouth every 6 (six) hours as needed. 15 tablet 0   No facility-administered medications prior to visit.    Allergies  Allergen Reactions   Lisinopril     Renal insufficiency    ROS     Objective:    Physical Exam Constitutional:      Appearance: She is well-developed.  Cardiovascular:     Rate and Rhythm: Normal rate and regular rhythm.     Heart sounds: Normal heart sounds. No murmur heard. Pulmonary:     Effort: Pulmonary effort is normal. No respiratory distress.     Breath sounds: Normal breath sounds. No wheezing.  Skin:    General: Skin is warm and dry.  Neurological:     Mental Status: She is oriented to person, place, and time.  Psychiatric:        Behavior: Behavior normal.        Thought Content: Thought content normal.        Judgment: Judgment normal.     Comments: Pleasantly  confused.      BP (!) 161/61 (BP Location: Right Arm, Patient Position: Sitting, Cuff Size: Small)   Pulse 81   Temp 98.7 F (37.1 C) (Oral)   Resp 16   Wt 171 lb (77.6 kg)   SpO2 100%   BMI 26.78 kg/m  Wt Readings from  Last 3 Encounters:  08/18/21 171 lb (77.6 kg)  07/30/21 173 lb 1 oz (78.5 kg)  06/30/21 173 lb (78.5 kg)       Assessment & Plan:   Problem List Items Addressed This Visit       Unprioritized   Hyperlipidemia    Continue lipitor 37m.        Relevant Orders   Lipid panel   HTN (hypertension) - Primary    The BP readings from her hospitalizations looked better than the readings we typically have in the office.  I suspect that there are some compliance issue with her memory.   Addendum: spoke with daughter and she has not been giving patient hydralazine. Advised her to begin TID.        Relevant Orders   Comp Met (CMET)   Dementia (HCC)    Stable, unchanged. Continue namenda 10 mg.       Bacterial infection due to Staphylococcus    Resolved. Monitor.       RESOLVED: Bacteremia due to Staphylococcus   AKI (acute kidney injury) ([place]    Obtain follow up CMET.       Other Visit Diagnoses     Microcytic anemia       Relevant Orders   CBC with Differential/Platelet   Iron   Ferritin   Needs flu shot       Relevant Orders   Flu Vaccine QUAD High Dose(Fluad) (Completed)       I am having Lynsie L. BTowerymaintain her acetaminophen, amLODipine, memantine, atorvastatin, potassium chloride SA, furosemide, traMADol, Cyanocobalamin (VITAMIN B12 PO), cloNIDine, hydrALAZINE, and metoprolol succinate.  No orders of the defined types were placed in this encounter.

## 2021-08-19 LAB — COMPREHENSIVE METABOLIC PANEL
ALT: 14 U/L (ref 0–35)
AST: 24 U/L (ref 0–37)
Albumin: 4.2 g/dL (ref 3.5–5.2)
Alkaline Phosphatase: 54 U/L (ref 39–117)
BUN: 8 mg/dL (ref 6–23)
CO2: 24 mEq/L (ref 19–32)
Calcium: 9.5 mg/dL (ref 8.4–10.5)
Chloride: 91 mEq/L — ABNORMAL LOW (ref 96–112)
Creatinine, Ser: 1.68 mg/dL — ABNORMAL HIGH (ref 0.40–1.20)
GFR: 28.17 mL/min — ABNORMAL LOW (ref >60.00)
Glucose, Bld: 89 mg/dL (ref 70–99)
Potassium: 4.5 mEq/L (ref 3.5–5.1)
Sodium: 126 mEq/L — ABNORMAL LOW (ref 135–145)
Total Bilirubin: 0.6 mg/dL (ref 0.2–1.2)
Total Protein: 7.5 g/dL (ref 6.0–8.3)

## 2021-08-19 LAB — FERRITIN: Ferritin: 118.7 ng/mL (ref 10.0–291.0)

## 2021-08-19 LAB — LIPID PANEL
Cholesterol: 192 mg/dL (ref 0–200)
HDL: 88.6 mg/dL (ref >39.00)
LDL Cholesterol: 91 mg/dL (ref 0–99)
NonHDL: 103.66
Total CHOL/HDL Ratio: 2
Triglycerides: 62 mg/dL (ref 0.0–149.0)
VLDL: 12.4 mg/dL (ref 0.0–40.0)

## 2021-08-19 LAB — CBC WITH DIFFERENTIAL/PLATELET
Basophils Absolute: 0 10*3/uL (ref 0.0–0.1)
Basophils Relative: 0.6 % (ref 0.0–3.0)
Eosinophils Absolute: 0.1 10*3/uL (ref 0.0–0.7)
Eosinophils Relative: 1.1 % (ref 0.0–5.0)
HCT: 32.6 % — ABNORMAL LOW (ref 36.0–46.0)
Hemoglobin: 10.7 g/dL — ABNORMAL LOW (ref 12.0–15.0)
Lymphocytes Relative: 12.7 % (ref 12.0–46.0)
Lymphs Abs: 1 10*3/uL (ref 0.7–4.0)
MCHC: 32.9 g/dL (ref 30.0–36.0)
MCV: 79.7 fl (ref 78.0–100.0)
Monocytes Absolute: 0.9 10*3/uL (ref 0.1–1.0)
Monocytes Relative: 11.6 % (ref 3.0–12.0)
Neutro Abs: 5.6 10*3/uL (ref 1.4–7.7)
Neutrophils Relative %: 74 % (ref 43.0–77.0)
Platelets: 326 10*3/uL (ref 150.0–400.0)
RBC: 4.09 Mil/uL (ref 3.87–5.11)
RDW: 15.2 % (ref 11.5–15.5)
WBC: 7.5 10*3/uL (ref 4.0–10.5)

## 2021-08-19 LAB — IRON: Iron: 57 ug/dL (ref 42–145)

## 2021-08-21 ENCOUNTER — Telehealth: Payer: Self-pay | Admitting: Family

## 2021-08-21 NOTE — Telephone Encounter (Signed)
Please advise daughter that pt's sodium is low.   I would like her to cut lasix in half and take 1/2 tab once daily and follow up with me in 1 week.

## 2021-08-25 ENCOUNTER — Encounter: Payer: Self-pay | Admitting: Family

## 2021-08-25 NOTE — Telephone Encounter (Signed)
Patient's daughter reach back on a mychart message, information sent to her on a message and advised to call for a one week follow up

## 2021-08-25 NOTE — Telephone Encounter (Signed)
Lvm for patient's daughter to call back for results and instructions

## 2021-08-26 NOTE — Telephone Encounter (Signed)
Information given to patient's daughter and scheduled to follow up 09-02-21

## 2021-08-31 ENCOUNTER — Other Ambulatory Visit (HOSPITAL_BASED_OUTPATIENT_CLINIC_OR_DEPARTMENT_OTHER): Payer: Self-pay

## 2021-09-01 ENCOUNTER — Encounter: Payer: Self-pay | Admitting: Family

## 2021-09-01 ENCOUNTER — Ambulatory Visit (INDEPENDENT_AMBULATORY_CARE_PROVIDER_SITE_OTHER): Payer: Medicare HMO | Admitting: Family

## 2021-09-01 ENCOUNTER — Other Ambulatory Visit: Payer: Self-pay

## 2021-09-01 VITALS — BP 145/52 | HR 53 | Temp 98.3°F | Resp 16 | Wt 167.0 lb

## 2021-09-01 DIAGNOSIS — E871 Hypo-osmolality and hyponatremia: Secondary | ICD-10-CM | POA: Diagnosis not present

## 2021-09-01 DIAGNOSIS — I10 Essential (primary) hypertension: Secondary | ICD-10-CM

## 2021-09-01 NOTE — Progress Notes (Addendum)
Subjective:   By signing my name below, I, Sherry Perez, attest that this documentation has been prepared under the direction and in the presence of Sherry Alar, NP, 09/01/2021   Patient ID: Sherry Perez, female    DOB: February 21, 1941, 80 y.o.   MRN: [id number]  Chief Complaint  Patient presents with   Hyponatremia    Follow up on low sodium levels.     HPI Patient is in today for an office visit.  Medication changes: She has been cutting her 40 mg furosemide in half to take 20 mg per day due to hyponatremia. She denies any increase in swelling and her weight shows no signs of fluid retention since decreasing medication.  Blood pressure: Her repeat blood pressure was measured at 145/52 with a manual cuff today.  Health Maintenance Due  Topic Date Due   OPHTHALMOLOGY EXAM  02/17/2017   HEMOGLOBIN A1C  04/10/2021   Zoster Vaccines- Shingrix (2 of 2) 08/05/2021    Past Medical History:  Diagnosis Date   Diabetes mellitus without complication (HCC)    HTN (hypertension)    Hyperlipidemia     Past Surgical History:  Procedure Laterality Date   ABDOMINAL HYSTERECTOMY     cataract surgery Left 02/04/2021    Family History  Problem Relation Age of Onset   Diabetes Mother    Diabetes Brother     Social History   Socioeconomic History   Marital status: Widowed    Spouse name: Not on file   Number of children: Not on file   Years of education: Not on file   Highest education level: Not on file  Occupational History   Not on file  Tobacco Use   Smoking status: Never   Smokeless tobacco: Never  Vaping Use   Vaping Use: Never used  Substance and Sexual Activity   Alcohol use: No    Alcohol/week: 0.0 standard drinks   Drug use: Never   Sexual activity: Not on file  Other Topics Concern   Not on file  Social History Narrative   4 children (3 daughters 1 son)- all local   7 grandchildren    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)   Widowed (husband  passed 2016)   Lives with [name]   Enjoys cross word puzzle, adult coloring books, reads bible   Right handed    Social Determinants of Health   Financial Resource Strain: Low Risk    Difficulty of Paying Living Expenses: Not hard at all  Food Insecurity: No Food Insecurity   Worried About Charity fundraiser in the Last Year: Never true   [place] in the Last Year: Never true  Transportation Needs: No Transportation Needs   Lack of Transportation (Medical): No   Lack of Transportation (Non-Medical): No  Physical Activity: Inactive   Days of Exercise per Week: 0 days   Minutes of Exercise per Session: 0 min  Stress: No Stress Concern Present   Feeling of Stress : Not at all  Social Connections: Moderately Isolated   Frequency of Communication with Friends and Family: More than three times a week   Frequency of Social Gatherings with Friends and Family: More than three times a week   Attends Religious Services: More than 4 times per year   Active Member of [employer] or Organizations: No   Attends Archivist Meetings: Never   Marital Status: Widowed  Intimate Partner Violence: Not At Risk   Fear  of Current or Ex-Partner: No   Emotionally Abused: No   Physically Abused: No   Sexually Abused: No    Outpatient Medications Prior to Visit  Medication Sig Dispense Refill   acetaminophen (TYLENOL) 500 MG tablet Take 2 tablets (1,000 mg total) by mouth 2 (two) times daily. 30 tablet 0   amLODipine (NORVASC) 10 MG tablet TAKE ONE TABLET BY MOUTH ONE TIME DAILY (Patient taking differently: Take 10 mg by mouth daily.) 90 tablet 1   atorvastatin (LIPITOR) 80 MG tablet TAKE ONE TABLET BY MOUTH ONE TIME DAILY (Patient taking differently: Take 80 mg by mouth daily.) 90 tablet 1   cloNIDine (CATAPRES) 0.1 MG tablet Take 1 tablet (0.1 mg total) by mouth daily. 30 tablet 2   Cyanocobalamin (VITAMIN B12 PO) Take 1 tablet by mouth daily.     furosemide (LASIX) 40 MG tablet TAKE  ONE TABLET BY MOUTH ONE TIME DAILY (Patient taking differently: Take 40 mg by mouth daily.) 90 tablet 1   hydrALAZINE (APRESOLINE) 50 MG tablet Take 1 tablet (50 mg total) by mouth 3 (three) times daily. 90 tablet 2   memantine (NAMENDA) 10 MG tablet Take 1 tablet (10 mg at night) for 2 weeks, then increase to 1 tablet (10 mg) twice a day (Patient taking differently: Take 10 mg by mouth 2 (two) times daily.) 60 tablet 11   metoprolol succinate (TOPROL-XL) 100 MG 24 hr tablet TAKE ONE TABLET BY MOUTH ONE TIME DAILY TAKE WITH OR IMMEDIATELY FOLLOWING A MEAL 90 tablet 1   potassium chloride SA (KLOR-CON) 20 MEQ tablet TAKE ONE-HALF TABLET BY MOUTH ONE TIME DAILY (Patient taking differently: Take 10 mEq by mouth daily.) 45 tablet 1   traMADol (ULTRAM) 50 MG tablet Take 1 tablet (50 mg total) by mouth every 6 (six) hours as needed. 15 tablet 0   No facility-administered medications prior to visit.    Allergies  Allergen Reactions   Lisinopril     Renal insufficiency    ROS See HPI    Objective:    Physical Exam Constitutional:      General: She is not in acute distress.    Appearance: Normal appearance. She is not ill-appearing.  HENT:     Head: Normocephalic and atraumatic.     Right Ear: External ear normal.     Left Ear: External ear normal.  Eyes:     Extraocular Movements: Extraocular movements intact.     Pupils: Pupils are equal, round, and reactive to light.  Cardiovascular:     Rate and Rhythm: Normal rate and regular rhythm.     Heart sounds: No murmur heard.   No gallop.  Pulmonary:     Effort: Pulmonary effort is normal. No respiratory distress.     Breath sounds: Normal breath sounds. No wheezing or rales.     Comments: (+) JVD detected in carotid Skin:    General: Skin is warm and dry.  Neurological:     Mental Status: She is alert.  Psychiatric:        Behavior: Behavior normal.        Judgment: Judgment normal.    BP (!) 145/52   Pulse (!) 53   Temp 98.3  F (36.8 C) (Oral)   Resp 16   Wt 167 lb (75.8 kg)   SpO2 100%   BMI 26.16 kg/m  Wt Readings from Last 3 Encounters:  09/01/21 167 lb (75.8 kg)  08/18/21 171 lb (77.6 kg)  07/30/21 173 lb 1  oz (78.5 kg)       Assessment & Plan:   Problem List Items Addressed This Visit       Unprioritized   Hyponatremia - Primary    Wt Readings from Last 3 Encounters:  09/01/21 167 lb (75.8 kg)  08/18/21 171 lb (77.6 kg)  07/30/21 173 lb 1 oz (78.5 kg)   Will repeat bmet.  She appears euvolemic despite decrease in lasix. Will continue lasix '20mg'$  once daily.       Relevant Orders   Basic metabolic panel   HTN (hypertension)    BP Readings from Last 3 Encounters:  09/01/21 (!) 145/52  08/18/21 (!) 161/61  08/04/21 (!) 125/51  Initial bp was elevated but repeat bp was improved. Continue amlodipine '10mg'$ . Metoprolol '50mg'$  and hydralazine '25mg'$  TID.         No orders of the defined types were placed in this encounter.   I, Sherry Alar, NP, personally preformed the services described in this documentation.  All medical record entries made by the scribe were at my direction and in my presence.  I have reviewed the chart and discharge instructions (if applicable) and agree that the record reflects my personal performance and is accurate and complete. 09/01/2021   I,Sherry Perez,acting as a Education administrator for Nance Pear, NP.,have documented all relevant documentation on the behalf of Nance Pear, NP,as directed by  Nance Pear, NP while in the presence of Nance Pear, NP.    Nance Pear, NP

## 2021-09-01 NOTE — Patient Instructions (Addendum)
Please complete lab work prior to leaving.  Please continue furosemide 1/2 tab once daily.  Call if you develop swelling or shortness of breath.

## 2021-09-01 NOTE — Assessment & Plan Note (Signed)
BP Readings from Last 3 Encounters:  09/01/21 (!) 145/52  08/18/21 (!) 161/61  08/04/21 (!) 125/51   Initial bp was elevated but repeat bp was improved. Continue amlodipine '10mg'$ . Metoprolol '50mg'$  and hydralazine '25mg'$  TID.

## 2021-09-01 NOTE — Assessment & Plan Note (Addendum)
Wt Readings from Last 3 Encounters:  09/01/21 167 lb (75.8 kg)  08/18/21 171 lb (77.6 kg)  07/30/21 173 lb 1 oz (78.5 kg)    Will repeat bmet.  She appears euvolemic despite decrease in lasix. Will continue lasix '20mg'$  once daily.

## 2021-09-02 LAB — BASIC METABOLIC PANEL
BUN: 11 mg/dL (ref 6–23)
CO2: 25 mEq/L (ref 19–32)
Calcium: 9.5 mg/dL (ref 8.4–10.5)
Chloride: 97 mEq/L (ref 96–112)
Creatinine, Ser: 1.42 mg/dL — ABNORMAL HIGH (ref 0.40–1.20)
GFR: 34.46 mL/min — ABNORMAL LOW (ref >60.00)
Glucose, Bld: 94 mg/dL (ref 70–99)
Potassium: 4.9 mEq/L (ref 3.5–5.1)
Sodium: 132 mEq/L — ABNORMAL LOW (ref 135–145)

## 2021-09-13 ENCOUNTER — Emergency Department (HOSPITAL_BASED_OUTPATIENT_CLINIC_OR_DEPARTMENT_OTHER): Payer: Medicare HMO

## 2021-09-13 ENCOUNTER — Emergency Department (HOSPITAL_BASED_OUTPATIENT_CLINIC_OR_DEPARTMENT_OTHER)
Admission: EM | Admit: 2021-09-13 | Discharge: 2021-09-13 | Disposition: A | Discharge status: Home / Self Care | Payer: Medicare HMO | Attending: Emergency Medicine | Admitting: Emergency Medicine

## 2021-09-13 ENCOUNTER — Other Ambulatory Visit: Payer: Self-pay

## 2021-09-13 DIAGNOSIS — R69 Illness, unspecified: Secondary | ICD-10-CM | POA: Diagnosis not present

## 2021-09-13 DIAGNOSIS — E119 Type 2 diabetes mellitus without complications: Secondary | ICD-10-CM | POA: Insufficient documentation

## 2021-09-13 DIAGNOSIS — R6883 Chills (without fever): Secondary | ICD-10-CM | POA: Diagnosis not present

## 2021-09-13 DIAGNOSIS — R0789 Other chest pain: Secondary | ICD-10-CM | POA: Insufficient documentation

## 2021-09-13 DIAGNOSIS — Z0389 Encounter for observation for other suspected diseases and conditions ruled out: Secondary | ICD-10-CM | POA: Diagnosis not present

## 2021-09-13 DIAGNOSIS — F0390 Unspecified dementia without behavioral disturbance: Secondary | ICD-10-CM | POA: Diagnosis not present

## 2021-09-13 DIAGNOSIS — R0602 Shortness of breath: Secondary | ICD-10-CM | POA: Diagnosis not present

## 2021-09-13 DIAGNOSIS — R5383 Other fatigue: Secondary | ICD-10-CM

## 2021-09-13 DIAGNOSIS — Z20822 Contact with and (suspected) exposure to covid-19: Secondary | ICD-10-CM | POA: Diagnosis not present

## 2021-09-13 DIAGNOSIS — R0600 Dyspnea, unspecified: Secondary | ICD-10-CM | POA: Diagnosis not present

## 2021-09-13 DIAGNOSIS — R197 Diarrhea, unspecified: Secondary | ICD-10-CM | POA: Insufficient documentation

## 2021-09-13 DIAGNOSIS — I10 Essential (primary) hypertension: Secondary | ICD-10-CM | POA: Insufficient documentation

## 2021-09-13 DIAGNOSIS — R112 Nausea with vomiting, unspecified: Secondary | ICD-10-CM | POA: Diagnosis not present

## 2021-09-13 DIAGNOSIS — R42 Dizziness and giddiness: Secondary | ICD-10-CM | POA: Diagnosis not present

## 2021-09-13 DIAGNOSIS — R1013 Epigastric pain: Secondary | ICD-10-CM | POA: Insufficient documentation

## 2021-09-13 DIAGNOSIS — R531 Weakness: Secondary | ICD-10-CM | POA: Diagnosis not present

## 2021-09-13 DIAGNOSIS — K5790 Diverticulosis of intestine, part unspecified, without perforation or abscess without bleeding: Secondary | ICD-10-CM | POA: Diagnosis not present

## 2021-09-13 DIAGNOSIS — K449 Diaphragmatic hernia without obstruction or gangrene: Secondary | ICD-10-CM | POA: Diagnosis not present

## 2021-09-13 DIAGNOSIS — Z79899 Other long term (current) drug therapy: Secondary | ICD-10-CM | POA: Diagnosis not present

## 2021-09-13 DIAGNOSIS — N3289 Other specified disorders of bladder: Secondary | ICD-10-CM | POA: Diagnosis not present

## 2021-09-13 DIAGNOSIS — D7389 Other diseases of spleen: Secondary | ICD-10-CM | POA: Diagnosis not present

## 2021-09-13 DIAGNOSIS — I700 Atherosclerosis of aorta: Secondary | ICD-10-CM | POA: Diagnosis not present

## 2021-09-13 LAB — RESP PANEL BY RT-PCR (FLU A&B, COVID) ARPGX2
Influenza A by PCR: NEGATIVE
Influenza B by PCR: NEGATIVE
SARS Coronavirus 2 by RT PCR: NEGATIVE

## 2021-09-13 LAB — CBC WITH DIFFERENTIAL/PLATELET
Abs Immature Granulocytes: 0.02 10*3/uL (ref 0.00–0.07)
Basophils Absolute: 0 10*3/uL (ref 0.0–0.1)
Basophils Relative: 1 %
Eosinophils Absolute: 0.1 10*3/uL (ref 0.0–0.5)
Eosinophils Relative: 1 %
HCT: 35.4 % — ABNORMAL LOW (ref 36.0–46.0)
Hemoglobin: 12.3 g/dL (ref 12.0–15.0)
Immature Granulocytes: 0 %
Lymphocytes Relative: 17 %
Lymphs Abs: 1.2 10*3/uL (ref 0.7–4.0)
MCH: 26.5 pg (ref 26.0–34.0)
MCHC: 34.7 g/dL (ref 30.0–36.0)
MCV: 76.3 fL — ABNORMAL LOW (ref 80.0–100.0)
Monocytes Absolute: 0.5 10*3/uL (ref 0.1–1.0)
Monocytes Relative: 7 %
Neutro Abs: 5.4 10*3/uL (ref 1.7–7.7)
Neutrophils Relative %: 74 %
Platelets: 348 10*3/uL (ref 150–400)
RBC: 4.64 MIL/uL (ref 3.87–5.11)
RDW: 14.5 % (ref 11.5–15.5)
WBC: 7.4 10*3/uL (ref 4.0–10.5)
nRBC: 0 % (ref 0.0–0.2)

## 2021-09-13 LAB — TROPONIN I (HIGH SENSITIVITY): Troponin I (High Sensitivity): 14 ng/L (ref <18)

## 2021-09-13 LAB — LIPASE, BLOOD: Lipase: 52 U/L — ABNORMAL HIGH (ref 11–51)

## 2021-09-13 LAB — COMPREHENSIVE METABOLIC PANEL
ALT: 12 U/L (ref 0–44)
AST: 20 U/L (ref 15–41)
Albumin: 3.9 g/dL (ref 3.5–5.0)
Alkaline Phosphatase: 61 U/L (ref 38–126)
Anion gap: 11 (ref 5–15)
BUN: 10 mg/dL (ref 8–23)
CO2: 22 mmol/L (ref 22–32)
Calcium: 8.7 mg/dL — ABNORMAL LOW (ref 8.9–10.3)
Chloride: 99 mmol/L (ref 98–111)
Creatinine, Ser: 1.11 mg/dL — ABNORMAL HIGH (ref 0.44–1.00)
GFR, Estimated: 50 mL/min — ABNORMAL LOW (ref >60)
Glucose, Bld: 95 mg/dL (ref 70–99)
Potassium: 3.7 mmol/L (ref 3.5–5.1)
Sodium: 132 mmol/L — ABNORMAL LOW (ref 135–145)
Total Bilirubin: 0.6 mg/dL (ref 0.3–1.2)
Total Protein: 7.8 g/dL (ref 6.5–8.1)

## 2021-09-13 LAB — LACTIC ACID, PLASMA: Lactic Acid, Venous: 1.5 mmol/L (ref 0.5–1.9)

## 2021-09-13 LAB — PROTIME-INR
INR: 1.2 (ref 0.8–1.2)
Prothrombin Time: 14.9 seconds (ref 11.4–15.2)

## 2021-09-13 LAB — APTT: aPTT: 24 seconds (ref 24–36)

## 2021-09-13 MED ORDER — LACTATED RINGERS IV BOLUS (SEPSIS): 1000.0000 mL | Freq: Once | INTRAVENOUS | Status: DC

## 2021-09-13 MED ORDER — LACTATED RINGERS IV SOLN: INTRAVENOUS | Status: DC

## 2021-09-13 MED ORDER — SODIUM CHLORIDE 0.9 % IV SOLN
2.0000 g | Freq: Once | INTRAVENOUS | Status: AC
  Administered 2021-09-13: 2 g via INTRAVENOUS
  Filled 2021-09-13: qty 2

## 2021-09-13 MED ORDER — LACTATED RINGERS IV BOLUS (SEPSIS): 250.0000 mL | Freq: Once | INTRAVENOUS | Status: DC

## 2021-09-13 MED ORDER — VANCOMYCIN HCL IN DEXTROSE 1-5 GM/200ML-% IV SOLN
1000.0000 mg | Freq: Once | INTRAVENOUS | Status: DC
  Filled 2021-09-13: qty 200

## 2021-09-13 MED ORDER — LACTATED RINGERS IV BOLUS (SEPSIS)
1000.0000 mL | Freq: Once | INTRAVENOUS | Status: AC
  Administered 2021-09-13: 1000 mL via INTRAVENOUS

## 2021-09-13 MED ORDER — METRONIDAZOLE 500 MG/100ML IV SOLN
500.0000 mg | Freq: Once | INTRAVENOUS | Status: DC
  Filled 2021-09-13: qty 100

## 2021-09-13 MED ORDER — SODIUM CHLORIDE 0.9 % IV SOLN: 2.0000 g | Freq: Two times a day (BID) | INTRAVENOUS | Status: DC

## 2021-09-13 NOTE — ED Notes (Signed)
Unsuccessful IV attempt L wrist, pt tolerated well. Donnie Coffin, RT will attempt with U/S

## 2021-09-13 NOTE — ED Provider Notes (Signed)
[facility] EMERGENCY DEPARTMENT Provider Note   CSN: [id number] Arrival date & time: 09/13/21  1321     History Chief Complaint  Patient presents with   Emesis    [name] is a 80 y.o. female.  HPI     80 year old female with a history of diabetes, hypertension, hyperlipidemia, admission for bacteremia secondary to staph hominis discharged from the hospital on Zyvox, who presents with concern for generalized weakness, nausea, vomiting.  Daughter reports that the symptoms she has today are consistent with the symptoms she had with her previous bacteremia and admission.  Reports that starting yesterday she developed fatigue generalized weakness, last night had significant nausea, with one episode of vomiting.  She has some lower chest/epigastric pain.  Has not had fevers, has felt some chills.  Feels lightheadedness.  Daughter reports it looks like she is grunting when she breathes.  Patient reports she has been short of breath.  Denies any cough.  Had some diarrhea but associated it with laxative use.  Past Medical History:  Diagnosis Date   Diabetes mellitus without complication (Sherry Perez)    HTN (hypertension)    Hyperlipidemia     Patient Active Problem List   Diagnosis Date Noted   Hyponatremia 09/01/2021   Bacterial infection due to Staphylococcus 07/30/2021   Dementia (Sherry Perez) 04/08/2021   Right knee pain 06/12/2018   Right shoulder pain 11/07/2017   Osteoarthritis 09/13/2016   Diabetes type 2, controlled (Sherry Perez) 06/07/2016   HTN (hypertension) 06/07/2016   Vitamin D deficiency 06/07/2016   Hyperlipidemia 06/07/2016    Past Surgical History:  Procedure Laterality Date   ABDOMINAL HYSTERECTOMY     cataract surgery Left 02/04/2021     OB History   No obstetric history on file.     Family History  Problem Relation Age of Onset   Diabetes Mother    Diabetes Brother     Social History   Tobacco Use   Smoking status: Never   Smokeless tobacco:  Never  Vaping Use   Vaping Use: Never used  Substance Use Topics   Alcohol use: No    Alcohol/week: 0.0 standard drinks   Drug use: Never    Home Medications Prior to Admission medications   Medication Sig Start Date End Date Taking? Authorizing Provider  acetaminophen (TYLENOL) 500 MG tablet Take 2 tablets (1,000 mg total) by mouth 2 (two) times daily. 06/19/20   [name], NP  amLODipine (NORVASC) 10 MG tablet TAKE ONE TABLET BY MOUTH ONE TIME DAILY Patient taking differently: Take 10 mg by mouth daily. 12/12/20   [name], NP  atorvastatin (LIPITOR) 80 MG tablet TAKE ONE TABLET BY MOUTH ONE TIME DAILY Patient taking differently: Take 80 mg by mouth daily. 07/28/21   [name], NP  cloNIDine (CATAPRES) 0.1 MG tablet Take 1 tablet (0.1 mg total) by mouth daily. 08/05/21 11/03/21  Dessa Phi, DO  Cyanocobalamin (VITAMIN B12 PO) Take 1 tablet by mouth daily.    [provider]  furosemide (LASIX) 40 MG tablet TAKE ONE TABLET BY MOUTH ONE TIME DAILY Patient taking differently: Take 40 mg by mouth daily. 07/28/21   [name], NP  hydrALAZINE (APRESOLINE) 50 MG tablet Take 1 tablet (50 mg total) by mouth 3 (three) times daily. 08/04/21 11/02/21  Dessa Phi, DO  memantine (NAMENDA) 10 MG tablet Take 1 tablet (10 mg at night) for 2 weeks, then increase to 1 tablet (10 mg) twice a day Patient taking differently: Take 10 mg  by mouth 2 (two) times daily. 06/25/21   Rondel Jumbo, PA-C  metoprolol succinate (TOPROL-XL) 100 MG 24 hr tablet TAKE ONE TABLET BY MOUTH ONE TIME DAILY TAKE WITH OR IMMEDIATELY FOLLOWING A MEAL 08/17/21   [name], NP  potassium chloride SA (KLOR-CON) 20 MEQ tablet TAKE ONE-HALF TABLET BY MOUTH ONE TIME DAILY Patient taking differently: Take 10 mEq by mouth daily. 07/28/21   [name], NP  traMADol (ULTRAM) 50 MG tablet Take 1 tablet (50 mg total) by mouth every 6 (six) hours as needed. 07/29/21   Margarita Mail, PA-C    Allergies    Lisinopril  Review of Systems   Review of Systems  Constitutional:  Positive for chills and fatigue. Negative for fever.  HENT:  Negative for sore throat.   Eyes:  Negative for visual disturbance.  Respiratory:  Positive for shortness of breath. Negative for cough.   Cardiovascular:  Positive for chest pain.  Gastrointestinal:  Positive for abdominal pain, nausea and vomiting. Negative for constipation. Diarrhea: not today but had recnetly from laxative. Genitourinary:  Negative for difficulty urinating.  Musculoskeletal:  Negative for back pain and neck pain.  Skin:  Negative for rash.  Neurological:  Positive for light-headedness. Negative for syncope.   Physical Exam Updated Vital Signs BP (!) 148/66   Pulse 65   Temp 98.8 F (37.1 C) (Oral)   Resp (!) 29   Wt 74.6 kg   SpO2 100%   BMI 25.76 kg/m   Physical Exam Vitals and nursing note reviewed.  Constitutional:      General: She is not in acute distress.    Appearance: She is well-developed. She is not diaphoretic.  HENT:     Head: Normocephalic and atraumatic.  Eyes:     Conjunctiva/sclera: Conjunctivae normal.  Cardiovascular:     Rate and Rhythm: Normal rate and regular rhythm.     Heart sounds: Normal heart sounds. No murmur heard.   No friction rub. No gallop.  Pulmonary:     Effort: Pulmonary effort is normal. No respiratory distress.     Breath sounds: Normal breath sounds. No wheezing or rales.  Abdominal:     General: There is no distension.     Palpations: Abdomen is soft.     Tenderness: There is abdominal tenderness (mild epigastric). There is no guarding.  Musculoskeletal:        General: No tenderness.     Cervical back: Normal range of motion.  Skin:    General: Skin is warm and dry.     Findings: No erythema or rash.  Neurological:     Mental Status: She is alert.    ED Results / Procedures / Treatments   Labs (all labs ordered are listed, but only  abnormal results are displayed) Labs Reviewed  COMPREHENSIVE METABOLIC PANEL - Abnormal; Notable for the following components:      Result Value   Sodium 132 (*)    Creatinine, Ser 1.11 (*)    Calcium 8.7 (*)    GFR, Estimated 50 (*)    All other components within normal limits  CBC WITH DIFFERENTIAL/PLATELET - Abnormal; Notable for the following components:   HCT 35.4 (*)    MCV 76.3 (*)    All other components within normal limits  LIPASE, BLOOD - Abnormal; Notable for the following components:   Lipase 52 (*)    All other components within normal limits  RESP PANEL BY RT-PCR (FLU A&B, COVID) ARPGX2  CULTURE,  BLOOD (ROUTINE X 2)  CULTURE, BLOOD (ROUTINE X 2)  URINE CULTURE  LACTIC ACID, PLASMA  PROTIME-INR  APTT  LACTIC ACID, PLASMA  URINALYSIS, ROUTINE W REFLEX MICROSCOPIC  TROPONIN I (HIGH SENSITIVITY)  TROPONIN I (HIGH SENSITIVITY)    EKG EKG Interpretation  Date/Time:  'Sunday September 13 2021 14:21:54 EDT Ventricular Rate:  65 PR Interval:  199 QRS Duration: 86 QT Interval:  429 QTC Calculation: 447 R Axis:   30 Text Interpretation: Sinus rhythm Minimal ST elevation, anterior leads No significant change since last tracing Confirmed by ,  ([id number]) on 09/13/2021 2:58:34 PM  Radiology CT ABDOMEN PELVIS WO CONTRAST  Result Date: 09/13/2021 CLINICAL DATA:  Abdominal pain, acute (Ped 0-18y) EXAM: CT ABDOMEN AND PELVIS WITHOUT CONTRAST TECHNIQUE: Multidetector CT imaging of the abdomen and pelvis was performed following the standard protocol without IV contrast. COMPARISON:  July 29, 2021 FINDINGS: Evaluation is limited by lack of IV contrast. Lower chest: Calcified RIGHT lower lobe granulomas. No acute abnormality. Atherosclerotic calcifications of the aorta. Hepatobiliary: Revisualization of a cyst of the caudate lobe. Unchanged hypodense mass of the RIGHT peripheral liver which is too small to accurately characterize but likely reflects a cyst. Gallbladder  is unremarkable. Pancreas: No peripancreatic fat stranding. Spleen: Punctate calcifications consistent with sequela of remote prior granulomatous infection. Adrenals/Urinary Tract: Adrenal glands are unremarkable. No hydronephrosis. Similar appearance of cortical scarring. No obstructive nephrolithiasis. Bladder is distended. Stomach/Bowel: Small hiatal hernia. No evidence of bowel obstruction. Scattered diverticulosis without evidence of acute diverticulitis. Intermittent fluid throughout the colon most consistent with diarrhea. Appendix is normal. Vascular/Lymphatic: Severe atherosclerotic calcifications without aneurysm. No new suspicious lymphadenopathy. Reproductive: Status post hysterectomy. No adnexal masses. Other: No free air.  Small volume pelvic free fluid, unchanged. Musculoskeletal: Acute osseous abnormality. IMPRESSION: No suspicious CT etiology for abdominal pain identified. Intermittent fluid throughout the colon likely reflecting diarrhea. Aortic Atherosclerosis (ICD10-I70.0). Electronically Signed   By: Stephanie  Peacock M.D.   On: 09/13/2021 15:54   DG Chest Port 1 View  Result Date: 09/13/2021 CLINICAL DATA:  Questionable sepsis - evaluate for abnormality EXAM: PORTABLE CHEST 1 VIEW COMPARISON:  July 29, 2021 FINDINGS: The cardiomediastinal silhouette is unchanged in contour.Atherosclerotic calcifications. No pleural effusion. No pneumothorax. No acute pleuroparenchymal abnormality. Visualized abdomen is unremarkable. IMPRESSION: No acute cardiopulmonary abnormality. Electronically Signed   By: Stephanie  Peacock M.D.   On: 09/13/2021 15:43    Procedures Procedures   Medications Ordered in ED Medications  lactated ringers infusion (has no administration in time range)  lactated ringers bolus 1,000 mL (has no administration in time range)    And  lactated ringers bolus 1,000 mL (1,000 mLs Intravenous New Bag/Given 09/13/21 1607)    And  lactated ringers bolus 250 mL (has no  administration in time range)  metroNIDAZOLE (FLAGYL) IVPB 500 mg (has no administration in time range)  ceFEPIme (MAXIPIME) 2 g in sodium chloride 0.9 % 100 mL IVPB (2 g Intravenous New Bag/Given 09/13/21 1610)    ED Course  I have reviewed the triage vital signs and the nursing notes.  Pertinent labs & imaging results that were available during my care of the patient were reviewed by me and considered in my medical decision making (see chart for details).    MDM Rules/Calculators/A&P                            80'$  year old female with a history of diabetes, hypertension, hyperlipidemia, admission for bacteremia  secondary to staph hominis discharged from the hospital on Zyvox, who presents with concern for generalized weakness, nausea, vomiting. Initial blood pressures 90s over 40s, and given history of similar symptoms with prior sepsis admission and bacteremia, initiated code sepsis and ordered empiric antibiotics for infection of unknown source and fluids.  On recheck of blood pressures, they improved after initial triage blood pressure.  Given abdominal pain, CT abdomen pelvis was also ordered--ordered WO contrast given last GFR low.  And troponin was ordered for evaluation of ACS as possible etiology of epigastric pain, nausea and vomiting and dyspnea.  Lab work and imaging are pending at time of transfer of care to Dr. Darl Householder.    Final Clinical Impression(s) / ED Diagnoses Final diagnoses:  Non-intractable vomiting with nausea, unspecified vomiting type  Generalized weakness  Other fatigue  Dyspnea, unspecified type    Rx / DC Orders ED Discharge Orders     None        Gareth Morgan, MD 09/13/21 1645

## 2021-09-13 NOTE — Progress Notes (Signed)
Elink monitoring for sepsis protocol 

## 2021-09-13 NOTE — ED Notes (Signed)
Patient transported to CT 

## 2021-09-13 NOTE — ED Notes (Signed)
Pt up to restroom with assistance, given hat to urinate in with instructions not to contaminate.

## 2021-09-13 NOTE — Progress Notes (Signed)
Pharmacy Antibiotic Note  Sherry Perez is a 80 y.o. female admitted on 09/13/2021 with sepsis.  Pharmacy has been consulted for cefepime dosing.  Patient with a history of diabetes, hypertension, hyperlipidemia, admission for bacteremia secondary to staph hominis discharged from the hospital on Zyvox. Patient presenting with weakness. Patient states that symptoms are c/w previous bacteremia admission.  SCr 1.11 which is at baseline WBC 7.4; LA 1.5  Plan: Cefepime 2g q12h Trend WBC, Fever, clinical course De-escalate when able  Weight: 74.6 kg (164 lb 8 oz)  Temp (24hrs), Avg:98.8 F (37.1 C), Min:98.8 F (37.1 C), Max:98.8 F (37.1 C)  No results for input(s): WBC, CREATININE, LATICACIDVEN, VANCOTROUGH, VANCOPEAK, VANCORANDOM, GENTTROUGH, GENTPEAK, GENTRANDOM, TOBRATROUGH, TOBRAPEAK, TOBRARND, AMIKACINPEAK, AMIKACINTROU, AMIKACIN in the last 168 hours.  Estimated Creatinine Clearance: 32.2 mL/min (A) (by C-G formula based on SCr of 1.42 mg/dL (H)).    Allergies  Allergen Reactions   Lisinopril     Renal insufficiency   Antimicrobials this admission: cefepime 9/25 >>   Microbiology results: Pending  Thank you for allowing pharmacy to be a part of this patient's care. Lorelei Pont, PharmD, BCPS 09/13/2021 5:25 PM ED Clinical Pharmacist -  [phone]

## 2021-09-13 NOTE — ED Triage Notes (Signed)
Pt's daughter states pt has been "grunting" today. Reports not feeling well since last night. Vomited this morning. Reports "last time she got admitted to the hospital"

## 2021-09-13 NOTE — Discharge Instructions (Signed)
Please stay hydrated.  Your work-up is unremarkable today.   Your CT scan showed likely you have a stomach virus.  Expect to have some loose stools or diarrhea  If your blood cultures positive, you will be called and you will need to return to the ER.  Return to ER if you have worse abdominal pain, vomiting, fever, dehydration, passing out, shortness of breath, chest pain

## 2021-09-13 NOTE — ED Notes (Signed)
Pt defectaed in urine, MD made aware

## 2021-09-13 NOTE — ED Provider Notes (Signed)
  Physical Exam  BP (!) 162/74   Pulse 69   Temp 98.8 F (37.1 C) (Oral)   Resp (!) 26   Wt 74.6 kg   SpO2 99%   BMI 25.76 kg/m   Physical Exam  ED Course/Procedures     Procedures  MDM  Care assumed at 3 PM.  Patient was here for generalized abdominal pain.  Patient had a recent admission for bacteremia.  Patient is no longer on antibiotics and afebrile.  Patient was noted to be hypotensive initially but rapidly improved with IV fluids.  Signout pending CT abdomen pelvis and sepsis work-up.  5:22 PM Patient is a difficult IV stick.  I was able to place ultrasound IV.  Patient received IV fluids and blood pressure is normal now.  Patient's white blood cell count is normal and lactate is normal.  CT abdomen pelvis showed likely diarrheal stool and no obvious colitis.  Patient had multiple loose stools in the ED.  Patient has no urinary symptoms.  At this point I think she has viral gastroenteritis.  Patient is tolerated p.o.  Stable for discharge at this point.  If she has positive blood culture she will be called to return to the ED.       Drenda Freeze, MD 09/13/21 [phone]

## 2021-09-13 NOTE — ED Notes (Signed)
Attempted IV 2x. Was able to get small amount of blood off one stick for [facility]

## 2021-09-15 ENCOUNTER — Encounter: Payer: Self-pay | Admitting: Family

## 2021-09-18 ENCOUNTER — Encounter: Payer: Self-pay | Admitting: Family

## 2021-09-18 LAB — CULTURE, BLOOD (ROUTINE X 2)
Culture: NO GROWTH
Culture: NO GROWTH
Special Requests: ADEQUATE

## 2021-09-21 ENCOUNTER — Ambulatory Visit (INDEPENDENT_AMBULATORY_CARE_PROVIDER_SITE_OTHER): Payer: Medicare HMO | Admitting: Family

## 2021-09-21 ENCOUNTER — Other Ambulatory Visit: Payer: Self-pay

## 2021-09-21 VITALS — BP 133/68 | HR 57 | Temp 99.1°F | Resp 16 | Ht 65.0 in | Wt 158.0 lb

## 2021-09-21 DIAGNOSIS — I10 Essential (primary) hypertension: Secondary | ICD-10-CM

## 2021-09-21 DIAGNOSIS — F03B0 Unspecified dementia, moderate, without behavioral disturbance, psychotic disturbance, mood disturbance, and anxiety: Secondary | ICD-10-CM

## 2021-09-21 DIAGNOSIS — R69 Illness, unspecified: Secondary | ICD-10-CM | POA: Diagnosis not present

## 2021-09-21 DIAGNOSIS — R197 Diarrhea, unspecified: Secondary | ICD-10-CM | POA: Diagnosis not present

## 2021-09-21 DIAGNOSIS — E871 Hypo-osmolality and hyponatremia: Secondary | ICD-10-CM

## 2021-09-21 LAB — COMPREHENSIVE METABOLIC PANEL
ALT: 9 U/L (ref 0–35)
AST: 14 U/L (ref 0–37)
Albumin: 4 g/dL (ref 3.5–5.2)
Alkaline Phosphatase: 56 U/L (ref 39–117)
BUN: 21 mg/dL (ref 6–23)
CO2: 30 mEq/L (ref 19–32)
Calcium: 9.5 mg/dL (ref 8.4–10.5)
Chloride: 100 mEq/L (ref 96–112)
Creatinine, Ser: 1.77 mg/dL — ABNORMAL HIGH (ref 0.40–1.20)
GFR: 26.45 mL/min — ABNORMAL LOW (ref >60.00)
Glucose, Bld: 92 mg/dL (ref 70–99)
Potassium: 4.5 mEq/L (ref 3.5–5.1)
Sodium: 141 mEq/L (ref 135–145)
Total Bilirubin: 0.5 mg/dL (ref 0.2–1.2)
Total Protein: 7.1 g/dL (ref 6.0–8.3)

## 2021-09-21 LAB — LIPASE: Lipase: 55 U/L (ref 11.0–59.0)

## 2021-09-21 NOTE — Patient Instructions (Signed)
Please complete lab work prior to leaving.  Complete stool sample and return at your earliest convenience.  

## 2021-09-21 NOTE — Progress Notes (Addendum)
Subjective:   By signing my name below, I, Shehryar Baig, attest that this documentation has been prepared under the direction and in the presence of [name] NP. 09/21/2021    Patient ID: Sherry Perez, female    DOB: 1941-12-14, 80 y.o.   MRN: [id number]  Chief Complaint  Patient presents with   Hypertension    Here for follow up   Diabetes    Here for follow    Hypertension  Diabetes  Patient is in today for a office visit. Her oldest daughter is present with her during this visit.  Family recently contacted office because patient had wandered out of her home. She is currently staying at home with her eldest daughter.   ER visit- She went to the ER on 09/13/2021 with abdominal pain. She was noted to be dehydrated but improved with IV fluids. CT abdomen was without any acute findings.  Diarrhea/frequency- She is eating and drinking normally at this time but her daughter reports she is frequently urinating and having diarrhea. She was taking anti-biotics after her last ER visit.  Aricept- Her daughter reports patient feels bad after taking Aricept. She has not taken it since. Her neurologist stopped prescribing it due to side effects. She was switched to namenda to manage her dementia.    Health Maintenance Due  Topic Date Due   OPHTHALMOLOGY EXAM  02/17/2017   HEMOGLOBIN A1C  04/10/2021   Zoster Vaccines- Shingrix (2 of 2) 08/05/2021    Past Medical History:  Diagnosis Date   Diabetes mellitus without complication (HCC)    HTN (hypertension)    Hyperlipidemia     Past Surgical History:  Procedure Laterality Date   ABDOMINAL HYSTERECTOMY     cataract surgery Left 02/04/2021    Family History  Problem Relation Age of Onset   Diabetes Mother    Diabetes Brother     Social History   Socioeconomic History   Marital status: Widowed    Spouse name: Not on file   Number of children: Not on file   Years of education: Not on file   Highest education  level: Not on file  Occupational History   Not on file  Tobacco Use   Smoking status: Never   Smokeless tobacco: Never  Vaping Use   Vaping Use: Never used  Substance and Sexual Activity   Alcohol use: No    Alcohol/week: 0.0 standard drinks   Drug use: Never   Sexual activity: Not on file  Other Topics Concern   Not on file  Social History Narrative   4 children (3 daughters 1 son)- all local   7 grandchildren    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)   Widowed (husband passed 2016)   Lives with [name]   Enjoys cross word puzzle, adult coloring books, reads bible   Right handed    Social Determinants of Health   Financial Resource Strain: Low Risk    Difficulty of Paying Living Expenses: Not hard at all  Food Insecurity: No Food Insecurity   Worried About Charity fundraiser in the Last Year: Never true   [place] in the Last Year: Never true  Transportation Needs: No Transportation Needs   Lack of Transportation (Medical): No   Lack of Transportation (Non-Medical): No  Physical Activity: Inactive   Days of Exercise per Week: 0 days   Minutes of Exercise per Session: 0 min  Stress: No Stress Concern Present  Feeling of Stress : Not at all  Social Connections: Moderately Isolated   Frequency of Communication with Friends and Family: More than three times a week   Frequency of Social Gatherings with Friends and Family: More than three times a week   Attends Religious Services: More than 4 times per year   Active Member of [employer] or Organizations: No   Attends Archivist Meetings: Never   Marital Status: Widowed  Human resources officer Violence: Not At Risk   Fear of Current or Ex-Partner: No   Emotionally Abused: No   Physically Abused: No   Sexually Abused: No    Outpatient Medications Prior to Visit  Medication Sig Dispense Refill   acetaminophen (TYLENOL) 500 MG tablet Take 2 tablets (1,000 mg total) by mouth 2 (two) times daily. 30 tablet  0   amLODipine (NORVASC) 10 MG tablet TAKE ONE TABLET BY MOUTH ONE TIME DAILY (Patient taking differently: Take 10 mg by mouth daily.) 90 tablet 1   atorvastatin (LIPITOR) 80 MG tablet TAKE ONE TABLET BY MOUTH ONE TIME DAILY (Patient taking differently: Take 80 mg by mouth daily.) 90 tablet 1   cloNIDine (CATAPRES) 0.1 MG tablet Take 1 tablet (0.1 mg total) by mouth daily. 30 tablet 2   Cyanocobalamin (VITAMIN B12 PO) Take 1 tablet by mouth daily.     furosemide (LASIX) 40 MG tablet TAKE ONE TABLET BY MOUTH ONE TIME DAILY (Patient taking differently: Take 40 mg by mouth daily.) 90 tablet 1   hydrALAZINE (APRESOLINE) 50 MG tablet Take 1 tablet (50 mg total) by mouth 3 (three) times daily. 90 tablet 2   memantine (NAMENDA) 10 MG tablet Take 1 tablet (10 mg at night) for 2 weeks, then increase to 1 tablet (10 mg) twice a day (Patient taking differently: Take 10 mg by mouth 2 (two) times daily.) 60 tablet 11   metoprolol succinate (TOPROL-XL) 100 MG 24 hr tablet TAKE ONE TABLET BY MOUTH ONE TIME DAILY TAKE WITH OR IMMEDIATELY FOLLOWING A MEAL 90 tablet 1   potassium chloride SA (KLOR-CON) 20 MEQ tablet TAKE ONE-HALF TABLET BY MOUTH ONE TIME DAILY (Patient taking differently: Take 10 mEq by mouth daily.) 45 tablet 1   traMADol (ULTRAM) 50 MG tablet Take 1 tablet (50 mg total) by mouth every 6 (six) hours as needed. 15 tablet 0   No facility-administered medications prior to visit.    Allergies  Allergen Reactions   Lisinopril     Renal insufficiency    Review of Systems  Gastrointestinal:  Positive for diarrhea.  Genitourinary:  Positive for frequency.      Objective:    Physical Exam Constitutional:      General: She is not in acute distress.    Appearance: Normal appearance. She is not ill-appearing.  HENT:     Head: Normocephalic and atraumatic.     Right Ear: External ear normal.     Left Ear: External ear normal.  Eyes:     Extraocular Movements: Extraocular movements intact.      Pupils: Pupils are equal, round, and reactive to light.  Cardiovascular:     Rate and Rhythm: Normal rate and regular rhythm.     Heart sounds: Normal heart sounds. No murmur heard.   No gallop.  Pulmonary:     Effort: Pulmonary effort is normal. No respiratory distress.     Breath sounds: Normal breath sounds. No wheezing or rales.  Skin:    General: Skin is warm and dry.  Neurological:  Mental Status: She is alert. She is disoriented.     Comments: She did not remember her recent ED visit  Psychiatric:        Behavior: Behavior normal.        Judgment: Judgment normal.    BP 133/68 (BP Location: Right Arm, Patient Position: Sitting, Cuff Size: Small)   Pulse (!) 57   Temp 99.1 F (37.3 C) (Oral)   Resp 16   Ht 5' 5"  (1.651 m)   Wt 158 lb (71.7 kg)   SpO2 100%   BMI 26.29 kg/m  Wt Readings from Last 3 Encounters:  09/21/21 158 lb (71.7 kg)  09/13/21 164 lb 8 oz (74.6 kg)  09/01/21 167 lb (75.8 kg)       Assessment & Plan:   Problem List Items Addressed This Visit       Unprioritized   Hyponatremia   Relevant Orders   Comp Met (CMET) (Completed)   HTN (hypertension)    BP Readings from Last 3 Encounters:  09/21/21 133/68  09/13/21 (!) 162/74  09/01/21 (!) 145/52        Diarrhea - Primary    New. Recommended stool studies as she has recently had abx during her hospitalization and is at risk for c. Diff colitis.  Recommended that she return specimen at her earliest convenience.       Relevant Orders   GI Profile, Stool, PCR   Comp Met (CMET) (Completed)   Lipase (Completed)   Dementia ([place])    She has started wandering and has now moved in with her eldest daughter who will be with her 24 hours a day.  We discussed installing an alarm on their door in case she tries to wander.  She continues to follow with neurology and is now on Namenda.          No orders of the defined types were placed in this encounter.   I, [name] NP, personally  preformed the services described in this documentation.  All medical record entries made by the scribe were at my direction and in my presence.  I have reviewed the chart and discharge instructions (if applicable) and agree that the record reflects my personal performance and is accurate and complete. 09/21/2021   I,Shehryar Baig,acting as a Education administrator for Nance Pear, NP.,have documented all relevant documentation on the behalf of Nance Pear, NP,as directed by  Nance Pear, NP while in the presence of Nance Pear, NP.   Nance Pear, NP

## 2021-09-21 NOTE — Assessment & Plan Note (Signed)
BP Readings from Last 3 Encounters:  09/21/21 133/68  09/13/21 (!) 162/74  09/01/21 (!) 145/52

## 2021-09-21 NOTE — Assessment & Plan Note (Signed)
New. Recommended stool studies as she has recently had abx during her hospitalization and is at risk for c. Diff colitis.  Recommended that she return specimen at her earliest convenience.

## 2021-09-21 NOTE — Assessment & Plan Note (Signed)
She has started wandering and has now moved in with her eldest daughter who will be with her 24 hours a day.  We discussed installing an alarm on their door in case she tries to wander.  She continues to follow with neurology and is now on Namenda.

## 2021-09-22 ENCOUNTER — Encounter: Payer: Self-pay | Admitting: Family

## 2021-10-04 IMAGING — CT CT HEAD W/O CM
3 series · 16 of 47 positions shown, 19 images · non-contrast
Comparison: Head CT 02/25/2020

CLINICAL DATA: Trauma

EXAM:
CT HEAD WITHOUT CONTRAST
TECHNIQUE: Contiguous axial images were obtained from the base of the skull
through the vertex without intravenous contrast.

[Series 2: head wo · axial · 0.45mm/px · z∈[-147,-17]mm · 10 of 32 slices shown, 13 images]
[im 3/32  brain]
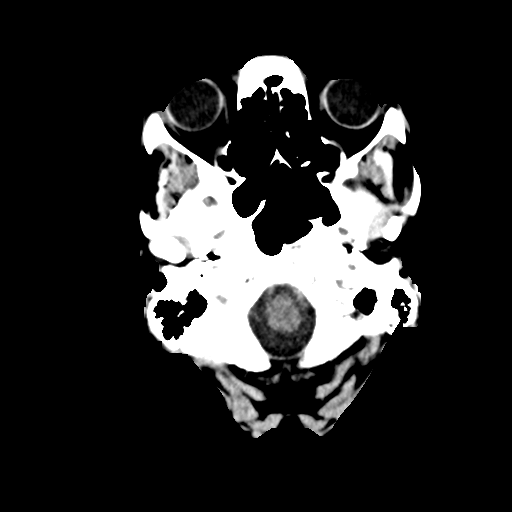
[im 3/32  bone]
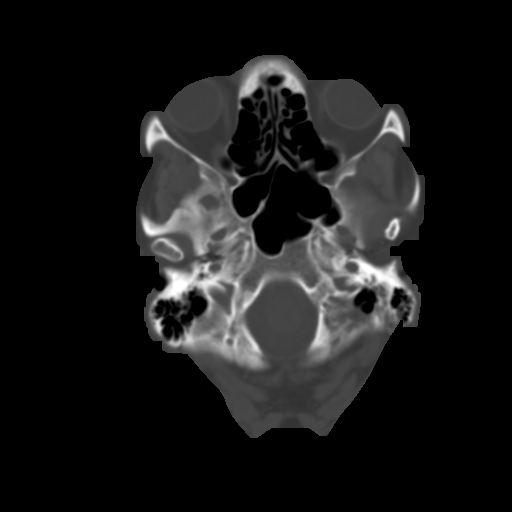
[im 6/32  brain]
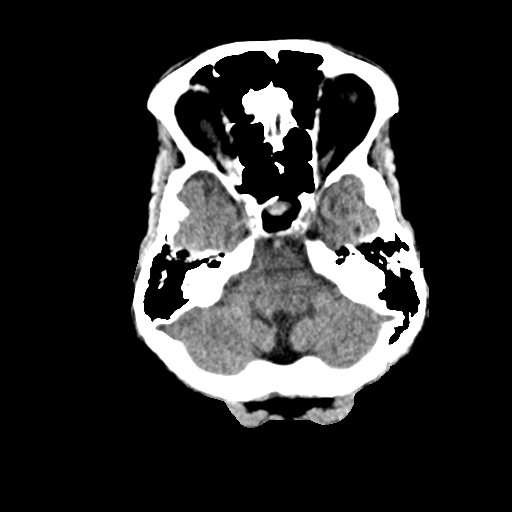
[im 9/32  brain]
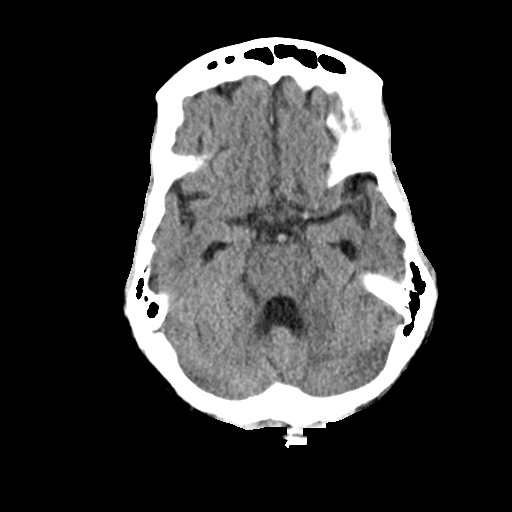
[im 11/32  brain]
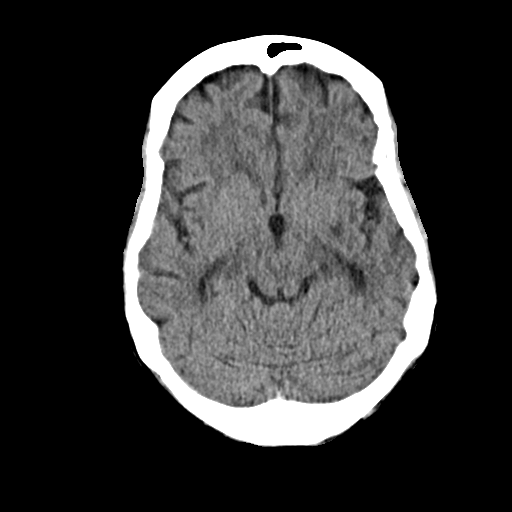
[im 14/32  brain]
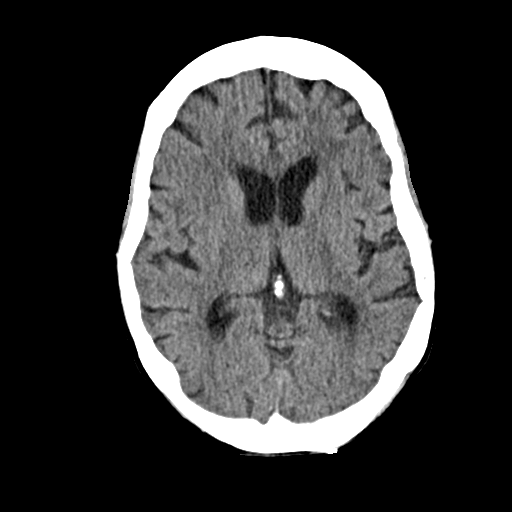
[im 14/32  bone]
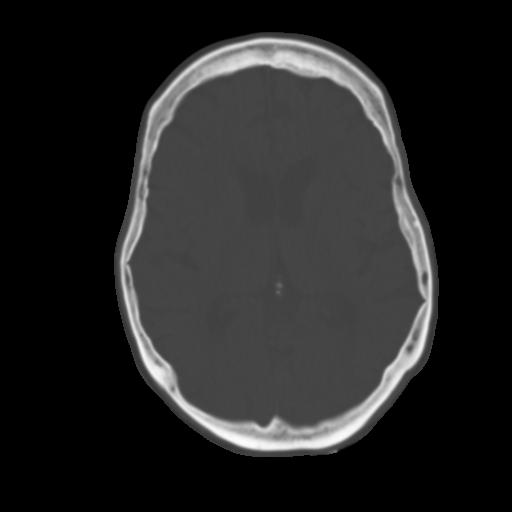
[im 18/32  brain]
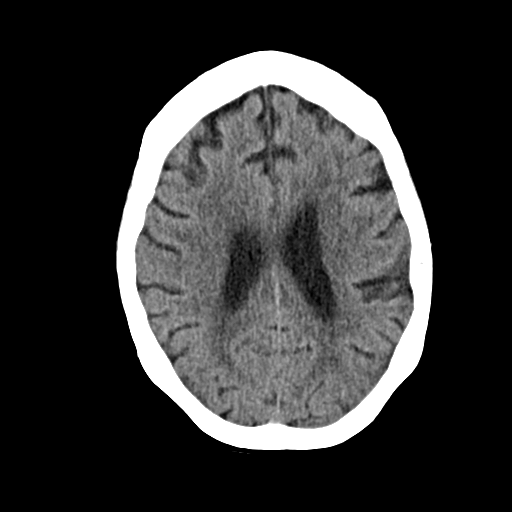
[im 21/32  brain]
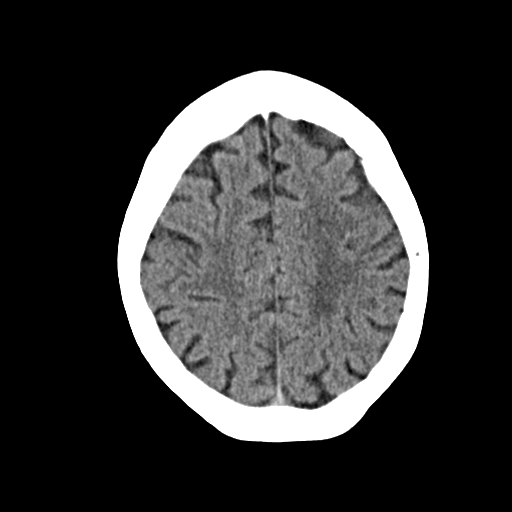
[im 24/32  brain]
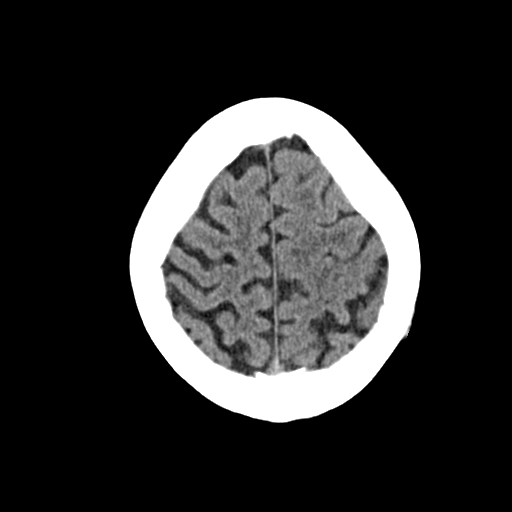
[im 26/32  brain]
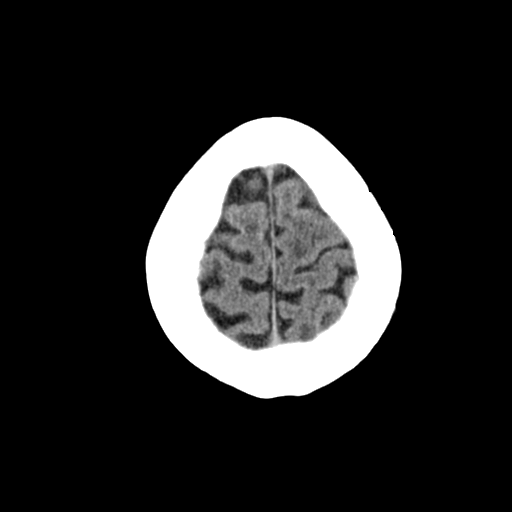
[im 26/32  bone]
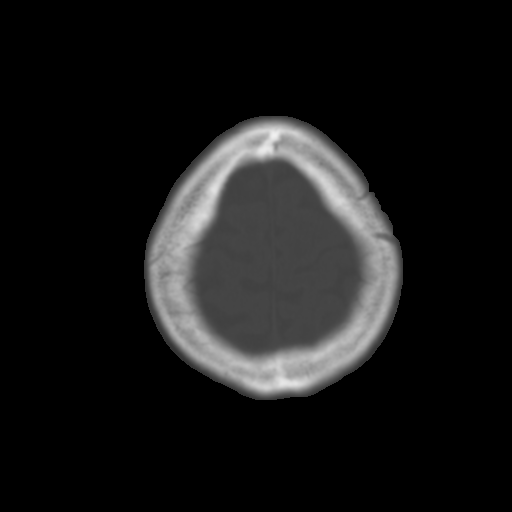
[im 29/32  brain]
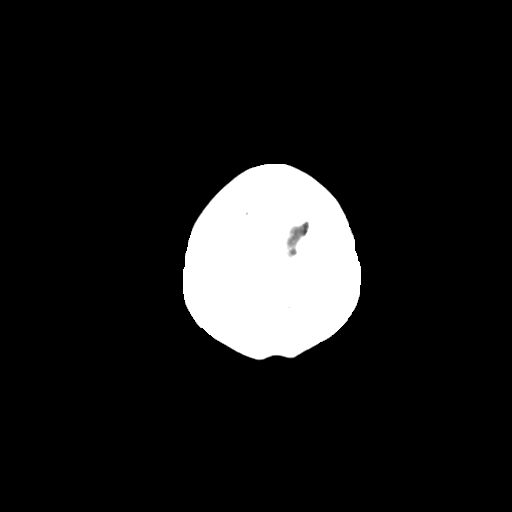

[Series 4: coronal soft · coronal · 0.33mm/px · 3 of 67 slices shown]
[im 23/67  brain]
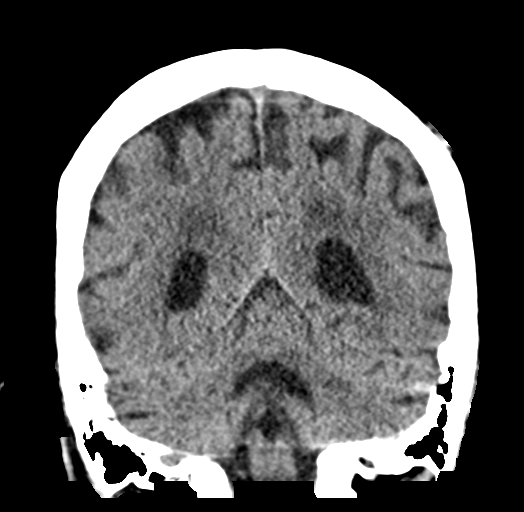
[im 30/67  brain]
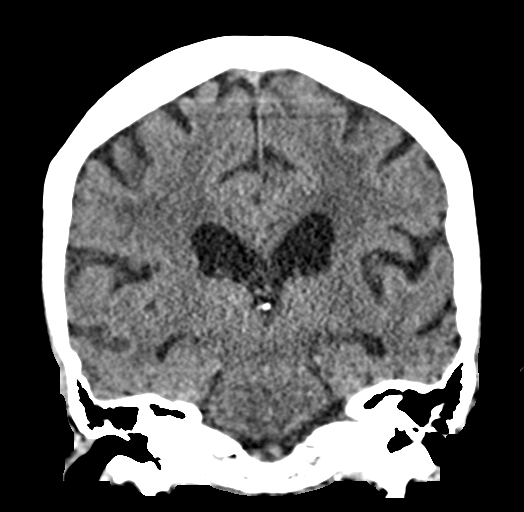
[im 37/67  brain]
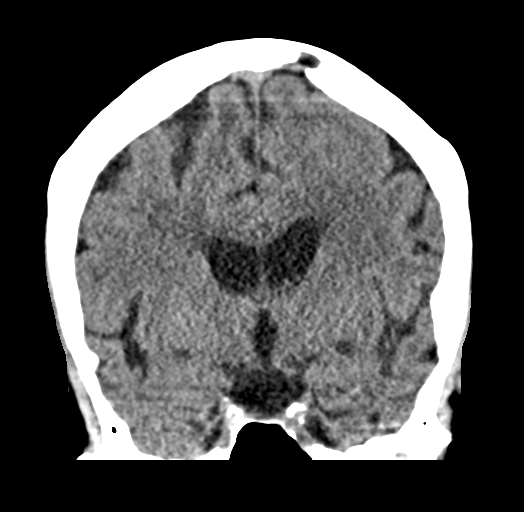

[Series 5: sag soft · sagittal · 0.32mm/px · 3 of 52 slices shown]
[im 18/52  brain]
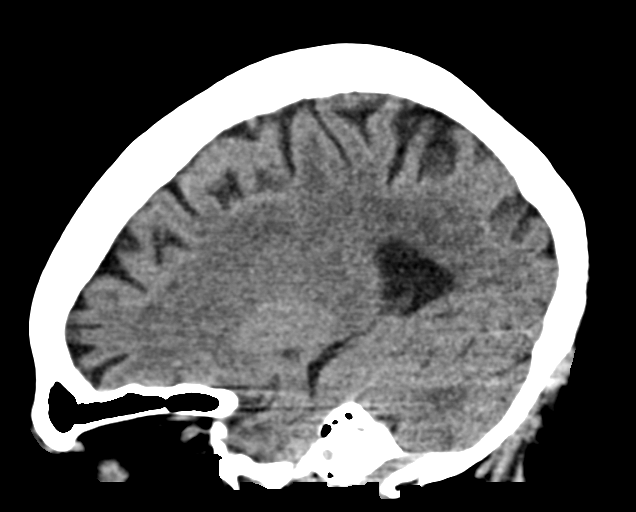
[im 26/52  brain]
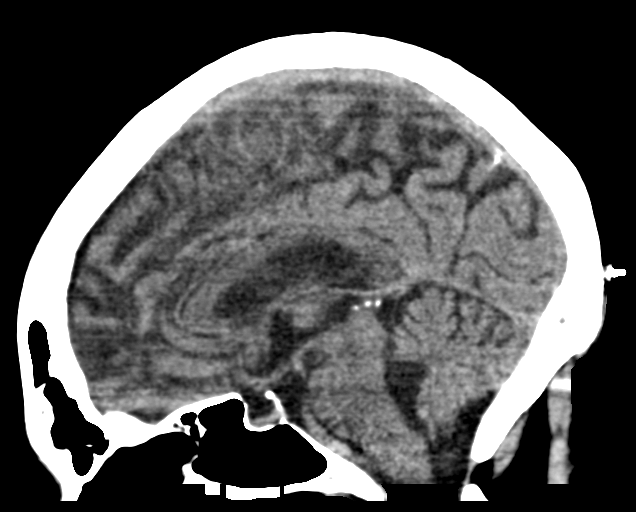
[im 35/52  brain]
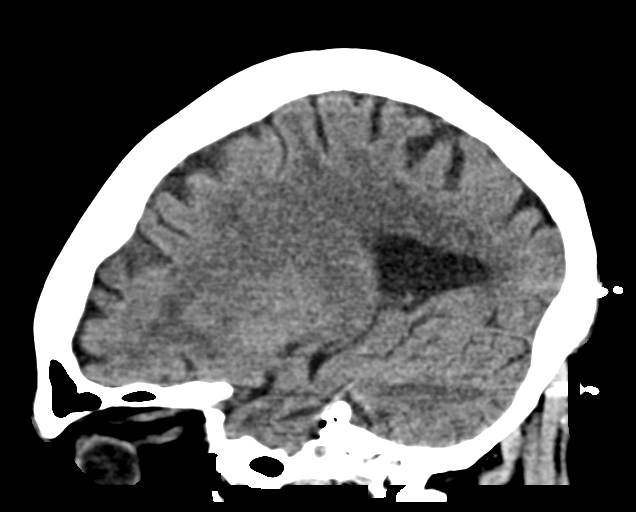

[16 of 47 positions shown; findings below may reference images not displayed]

FINDINGS: Brain: There is no evidence of acute intracranial hemorrhage,
extra-axial fluid collection, or infarct.

Foci of hypodensity in the subcortical and periventricular white
matter are nonspecific but likely reflect sequela of chronic white
matter microangiopathy, not significantly changed. Mild parenchymal
volume loss is unchanged. The ventricles are stable in size. There
is no midline shift. No mass lesion is identified.

Vascular: There is calcification of the bilateral cavernous ICAs.

Skull: There is minimal stranding in the left parietal scalp without
underlying calvarial fracture. There is no suspicious osseous
lesion.

Sinuses/Orbits: The imaged paranasal sinuses are clear. The mastoid
air cells are clear. A left lens implant is noted. The globes and
orbits are otherwise unremarkable.

Other: There is degenerative change of the left temporomandibular
joint.
IMPRESSION: 1. No acute intracranial hemorrhage or calvarial fracture.
2. Stable mild parenchymal volume loss and chronic white matter
microangiopathy.

## 2021-10-07 ENCOUNTER — Ambulatory Visit (INDEPENDENT_AMBULATORY_CARE_PROVIDER_SITE_OTHER): Payer: Medicare HMO | Admitting: Family

## 2021-10-07 ENCOUNTER — Other Ambulatory Visit: Payer: Self-pay

## 2021-10-07 DIAGNOSIS — F03B0 Unspecified dementia, moderate, without behavioral disturbance, psychotic disturbance, mood disturbance, and anxiety: Secondary | ICD-10-CM

## 2021-10-07 DIAGNOSIS — R69 Illness, unspecified: Secondary | ICD-10-CM | POA: Diagnosis not present

## 2021-10-07 NOTE — Assessment & Plan Note (Signed)
I agree that patient is not competent to make her medical decisions at this time due to her dementia and would benefit from having a legal guardian. I provided a letter as such to her daughter today.

## 2021-10-07 NOTE — Progress Notes (Signed)
Subjective:   By signing my name below, I, Shehryar Baig, attest that this documentation has been prepared under the direction and in the presence of [name] NP. 10/07/2021    Patient ID: Sherry Perez, female    DOB: 12/19/1941, 80 y.o.   MRN: [id number]  Chief Complaint  Patient presents with   Follow-up    Here for guardianship papaerwork    HPI Patient is in today for a office visit. Her daughter is present with her during this visit.   Guardianship- Her daughter is requesting for guardianship over her mother. They have met with a lawyer but do not have forms from them at this time. She is not aware of a healthcare power of attorney being on file for her mother.  Diarrhea- Her daughter reports her diarrhea has improved since she moved in with her daughter and they have been focusing on a healthier diet.    Health Maintenance Due  Topic Date Due   OPHTHALMOLOGY EXAM  02/17/2017   Pneumonia Vaccine 12+ Years old (2 - PPSV23 or PCV20) 05/30/2019   HEMOGLOBIN A1C  04/10/2021   Zoster Vaccines- Shingrix (2 of 2) 08/05/2021   COVID-19 Vaccine (5 - Booster) 10/10/2021    Past Medical History:  Diagnosis Date   Diabetes mellitus without complication (HCC)    HTN (hypertension)    Hyperlipidemia     Past Surgical History:  Procedure Laterality Date   ABDOMINAL HYSTERECTOMY     cataract surgery Left 02/04/2021    Family History  Problem Relation Age of Onset   Diabetes Mother    Diabetes Brother     Social History   Socioeconomic History   Marital status: Widowed    Spouse name: Not on file   Number of children: Not on file   Years of education: Not on file   Highest education level: Not on file  Occupational History   Not on file  Tobacco Use   Smoking status: Never   Smokeless tobacco: Never  Vaping Use   Vaping Use: Never used  Substance and Sexual Activity   Alcohol use: No    Alcohol/week: 0.0 standard drinks   Drug use: Never   Sexual  activity: Not on file  Other Topics Concern   Not on file  Social History Narrative   4 children (3 daughters 1 son)- all local   7 grandchildren    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)   Widowed (husband passed 2016)   Lives with [name]   Enjoys cross word puzzle, adult coloring books, reads bible   Right handed    Social Determinants of Health   Financial Resource Strain: Low Risk    Difficulty of Paying Living Expenses: Not hard at all  Food Insecurity: No Food Insecurity   Worried About Charity fundraiser in the Last Year: Never true   [place] in the Last Year: Never true  Transportation Needs: No Transportation Needs   Lack of Transportation (Medical): No   Lack of Transportation (Non-Medical): No  Physical Activity: Inactive   Days of Exercise per Week: 0 days   Minutes of Exercise per Session: 0 min  Stress: No Stress Concern Present   Feeling of Stress : Not at all  Social Connections: Moderately Isolated   Frequency of Communication with Friends and Family: More than three times a week   Frequency of Social Gatherings with Friends and Family: More than three times a week  Attends Religious Services: More than 4 times per year   Active Member of Clubs or Organizations: No   Attends Archivist Meetings: Never   Marital Status: Widowed  Human resources officer Violence: Not At Risk   Fear of Current or Ex-Partner: No   Emotionally Abused: No   Physically Abused: No   Sexually Abused: No    Outpatient Medications Prior to Visit  Medication Sig Dispense Refill   acetaminophen (TYLENOL) 500 MG tablet Take 2 tablets (1,000 mg total) by mouth 2 (two) times daily. 30 tablet 0   amLODipine (NORVASC) 10 MG tablet TAKE ONE TABLET BY MOUTH ONE TIME DAILY (Patient taking differently: Take 10 mg by mouth daily.) 90 tablet 1   atorvastatin (LIPITOR) 80 MG tablet TAKE ONE TABLET BY MOUTH ONE TIME DAILY (Patient taking differently: Take 80 mg by mouth  daily.) 90 tablet 1   cloNIDine (CATAPRES) 0.1 MG tablet Take 1 tablet (0.1 mg total) by mouth daily. 30 tablet 2   Cyanocobalamin (VITAMIN B12 PO) Take 1 tablet by mouth daily.     furosemide (LASIX) 40 MG tablet TAKE ONE TABLET BY MOUTH ONE TIME DAILY (Patient taking differently: Take 40 mg by mouth daily.) 90 tablet 1   hydrALAZINE (APRESOLINE) 50 MG tablet Take 1 tablet (50 mg total) by mouth 3 (three) times daily. 90 tablet 2   memantine (NAMENDA) 10 MG tablet Take 1 tablet (10 mg at night) for 2 weeks, then increase to 1 tablet (10 mg) twice a day (Patient taking differently: Take 10 mg by mouth 2 (two) times daily.) 60 tablet 11   metoprolol succinate (TOPROL-XL) 100 MG 24 hr tablet TAKE ONE TABLET BY MOUTH ONE TIME DAILY TAKE WITH OR IMMEDIATELY FOLLOWING A MEAL 90 tablet 1   potassium chloride SA (KLOR-CON) 20 MEQ tablet TAKE ONE-HALF TABLET BY MOUTH ONE TIME DAILY (Patient taking differently: Take 10 mEq by mouth daily.) 45 tablet 1   traMADol (ULTRAM) 50 MG tablet Take 1 tablet (50 mg total) by mouth every 6 (six) hours as needed. 15 tablet 0   No facility-administered medications prior to visit.    Allergies  Allergen Reactions   Lisinopril     Renal insufficiency    ROS See HPI    Objective:    Physical Exam Constitutional:      General: She is not in acute distress.    Appearance: Normal appearance. She is not ill-appearing.  HENT:     Head: Normocephalic and atraumatic.     Right Ear: External ear normal.     Left Ear: External ear normal.  Eyes:     Extraocular Movements: Extraocular movements intact.     Pupils: Pupils are equal, round, and reactive to light.  Cardiovascular:     Rate and Rhythm: Normal rate and regular rhythm.     Heart sounds: Normal heart sounds. No murmur heard.   No gallop.  Pulmonary:     Effort: Pulmonary effort is normal. No respiratory distress.     Breath sounds: Normal breath sounds. No wheezing or rales.  Skin:    General: Skin  is warm and dry.  Neurological:     Mental Status: She is alert.  Psychiatric:        Behavior: Behavior normal.    BP 133/80 (BP Location: Right Arm, Patient Position: Sitting, Cuff Size: Small)   Pulse (!) 56   Temp 98.6 F (37 C) (Oral)   Resp 16   Wt 155 lb (70.3  kg)   SpO2 100%   BMI 25.79 kg/m  Wt Readings from Last 3 Encounters:  10/07/21 155 lb (70.3 kg)  09/21/21 158 lb (71.7 kg)  09/13/21 164 lb 8 oz (74.6 kg)       Assessment & Plan:   Problem List Items Addressed This Visit       Unprioritized   Dementia ([place])    I agree that patient is not competent to make her medical decisions at this time due to her dementia and would benefit from having a legal guardian. I provided a letter as such to her daughter today.          No orders of the defined types were placed in this encounter.   I, [name] NP, personally preformed the services described in this documentation.  All medical record entries made by the scribe were at my direction and in my presence.  I have reviewed the chart and discharge instructions (if applicable) and agree that the record reflects my personal performance and is accurate and complete. 10/07/2021   I,Shehryar Baig,acting as a Education administrator for Nance Pear, NP.,have documented all relevant documentation on the behalf of Nance Pear, NP,as directed by  Nance Pear, NP while in the presence of Nance Pear, NP.   Nance Pear, NP

## 2021-10-10 ENCOUNTER — Encounter: Payer: Self-pay | Admitting: Family

## 2021-10-13 ENCOUNTER — Ambulatory Visit: Payer: Medicare HMO | Admitting: Family

## 2021-10-16 ENCOUNTER — Other Ambulatory Visit: Payer: Self-pay | Admitting: Family

## 2021-10-21 ENCOUNTER — Other Ambulatory Visit: Payer: Self-pay | Admitting: Family

## 2021-10-25 ENCOUNTER — Other Ambulatory Visit: Payer: Self-pay | Admitting: Family

## 2021-11-14 ENCOUNTER — Other Ambulatory Visit: Payer: Self-pay | Admitting: Family

## 2021-11-26 ENCOUNTER — Other Ambulatory Visit: Payer: Self-pay | Admitting: Family

## 2021-11-30 ENCOUNTER — Encounter: Payer: Self-pay | Admitting: Family

## 2021-12-01 ENCOUNTER — Ambulatory Visit: Payer: Medicare HMO | Admitting: Family

## 2021-12-01 NOTE — Telephone Encounter (Signed)
Called patient's daughter and she will get an appointment for patient to be seen tomorrow pm, I will call her back if an appointment opens up for [place] location

## 2021-12-02 ENCOUNTER — Emergency Department (HOSPITAL_BASED_OUTPATIENT_CLINIC_OR_DEPARTMENT_OTHER)
Admission: EM | Admit: 2021-12-02 | Discharge: 2021-12-02 | Disposition: A | Discharge status: Home / Self Care | Payer: Medicare HMO | Attending: Emergency Medicine | Admitting: Emergency Medicine

## 2021-12-02 ENCOUNTER — Ambulatory Visit (HOSPITAL_BASED_OUTPATIENT_CLINIC_OR_DEPARTMENT_OTHER)
Admission: RE | Admit: 2021-12-02 | Discharge: 2021-12-02 | Disposition: A | Discharge status: Home / Self Care | Payer: Medicare HMO | Source: Ambulatory Visit | Attending: Medical | Admitting: Medical

## 2021-12-02 ENCOUNTER — Ambulatory Visit (INDEPENDENT_AMBULATORY_CARE_PROVIDER_SITE_OTHER): Payer: Medicare HMO | Admitting: Medical

## 2021-12-02 ENCOUNTER — Encounter (HOSPITAL_BASED_OUTPATIENT_CLINIC_OR_DEPARTMENT_OTHER): Payer: Self-pay | Admitting: *Deleted

## 2021-12-02 ENCOUNTER — Other Ambulatory Visit: Payer: Self-pay

## 2021-12-02 VITALS — BP 103/54 | HR 99 | Temp 98.4°F | Resp 18 | Ht 65.0 in | Wt 155.0 lb

## 2021-12-02 DIAGNOSIS — R059 Cough, unspecified: Secondary | ICD-10-CM | POA: Insufficient documentation

## 2021-12-02 DIAGNOSIS — I10 Essential (primary) hypertension: Secondary | ICD-10-CM | POA: Diagnosis not present

## 2021-12-02 DIAGNOSIS — R0789 Other chest pain: Secondary | ICD-10-CM | POA: Diagnosis not present

## 2021-12-02 DIAGNOSIS — R5383 Other fatigue: Secondary | ICD-10-CM

## 2021-12-02 DIAGNOSIS — F0390 Unspecified dementia without behavioral disturbance: Secondary | ICD-10-CM | POA: Insufficient documentation

## 2021-12-02 DIAGNOSIS — R0600 Dyspnea, unspecified: Secondary | ICD-10-CM | POA: Diagnosis not present

## 2021-12-02 DIAGNOSIS — E119 Type 2 diabetes mellitus without complications: Secondary | ICD-10-CM | POA: Insufficient documentation

## 2021-12-02 DIAGNOSIS — Z79899 Other long term (current) drug therapy: Secondary | ICD-10-CM | POA: Insufficient documentation

## 2021-12-02 DIAGNOSIS — R079 Chest pain, unspecified: Secondary | ICD-10-CM | POA: Diagnosis not present

## 2021-12-02 DIAGNOSIS — R69 Illness, unspecified: Secondary | ICD-10-CM | POA: Diagnosis not present

## 2021-12-02 LAB — CBC WITH DIFFERENTIAL/PLATELET
Abs Immature Granulocytes: 0.02 10*3/uL (ref 0.00–0.07)
Basophils Absolute: 0 10*3/uL (ref 0.0–0.1)
Basophils Relative: 1 %
Eosinophils Absolute: 0 10*3/uL (ref 0.0–0.5)
Eosinophils Relative: 0 %
HCT: 36 % (ref 36.0–46.0)
Hemoglobin: 12 g/dL (ref 12.0–15.0)
Immature Granulocytes: 0 %
Lymphocytes Relative: 14 %
Lymphs Abs: 1.2 10*3/uL (ref 0.7–4.0)
MCH: 26 pg (ref 26.0–34.0)
MCHC: 33.3 g/dL (ref 30.0–36.0)
MCV: 78.1 fL — ABNORMAL LOW (ref 80.0–100.0)
Monocytes Absolute: 1.1 10*3/uL — ABNORMAL HIGH (ref 0.1–1.0)
Monocytes Relative: 13 %
Neutro Abs: 6 10*3/uL (ref 1.7–7.7)
Neutrophils Relative %: 72 %
Platelets: 312 10*3/uL (ref 150–400)
RBC: 4.61 MIL/uL (ref 3.87–5.11)
RDW: 14.6 % (ref 11.5–15.5)
WBC: 8.4 10*3/uL (ref 4.0–10.5)
nRBC: 0 % (ref 0.0–0.2)

## 2021-12-02 LAB — COMPREHENSIVE METABOLIC PANEL
ALT: 14 U/L (ref 0–35)
ALT: 18 U/L (ref 0–44)
AST: 25 U/L (ref 0–37)
AST: 32 U/L (ref 15–41)
Albumin: 3.9 g/dL (ref 3.5–5.2)
Albumin: 3.7 g/dL (ref 3.5–5.0)
Alkaline Phosphatase: 55 U/L (ref 39–117)
Alkaline Phosphatase: 54 U/L (ref 38–126)
Anion gap: 13 (ref 5–15)
BUN: 21 mg/dL (ref 6–23)
BUN: 22 mg/dL (ref 8–23)
CO2: 26 mEq/L (ref 19–32)
CO2: 25 mmol/L (ref 22–32)
Calcium: 9.1 mg/dL (ref 8.4–10.5)
Calcium: 8.5 mg/dL — ABNORMAL LOW (ref 8.9–10.3)
Chloride: 97 mEq/L (ref 96–112)
Chloride: 97 mmol/L — ABNORMAL LOW (ref 98–111)
Creatinine, Ser: 1.89 mg/dL — ABNORMAL HIGH (ref 0.40–1.20)
Creatinine, Ser: 2.13 mg/dL — ABNORMAL HIGH (ref 0.44–1.00)
GFR, Estimated: 23 mL/min — ABNORMAL LOW (ref >60)
GFR: 24.41 mL/min — ABNORMAL LOW (ref >60.00)
Glucose, Bld: 100 mg/dL — ABNORMAL HIGH (ref 70–99)
Glucose, Bld: 117 mg/dL — ABNORMAL HIGH (ref 70–99)
Potassium: 3.6 mEq/L (ref 3.5–5.1)
Potassium: 3.6 mmol/L (ref 3.5–5.1)
Sodium: 137 mEq/L (ref 135–145)
Sodium: 135 mmol/L (ref 135–145)
Total Bilirubin: 0.6 mg/dL (ref 0.2–1.2)
Total Bilirubin: 0.6 mg/dL (ref 0.3–1.2)
Total Protein: 8.2 g/dL (ref 6.0–8.3)
Total Protein: 8 g/dL (ref 6.5–8.1)

## 2021-12-02 LAB — POCT INFLUENZA A/B
Influenza A, POC: NEGATIVE
Influenza B, POC: NEGATIVE

## 2021-12-02 LAB — TROPONIN I (HIGH SENSITIVITY)
High Sens Troponin I: 24 ng/L (ref 2–17)
Troponin I (High Sensitivity): 20 ng/L — ABNORMAL HIGH (ref <18)

## 2021-12-02 LAB — POC COVID19 BINAXNOW: SARS Coronavirus 2 Ag: NEGATIVE

## 2021-12-02 MED ORDER — AZITHROMYCIN 250 MG PO TABS: ORAL_TABLET | ORAL | 0 refills | Status: AC

## 2021-12-02 MED ORDER — ALBUTEROL SULFATE HFA 108 (90 BASE) MCG/ACT IN AERS: 2.0000 | INHALATION_SPRAY | Freq: Four times a day (QID) | RESPIRATORY_TRACT | 0 refills | Status: DC | PRN

## 2021-12-02 MED ORDER — BENZONATATE 100 MG PO CAPS: 100.0000 mg | ORAL_CAPSULE | Freq: Three times a day (TID) | ORAL | 0 refills | Status: DC | PRN

## 2021-12-02 NOTE — ED Triage Notes (Signed)
Pt received call from PMD this pm and sent for eval for abnormal labs

## 2021-12-02 NOTE — ED Provider Notes (Signed)
[facility] EMERGENCY DEPARTMENT Provider Note   CSN: [id number] Arrival date & time: 12/02/21  1818     History Chief Complaint  Patient presents with   abnormal labs    Sherry Perez is a 80 y.o. female.  80 yo F with a chief complaints of chest pain.  This apparently happened earlier.  The patient not complaining of chest pain currently.  Has dementia and so has trouble providing much history.  Per the daughter they went to see her doctor this morning and he told him that they are sending off lab work.  They were called this evening and told that they needed to come to the ED immediately.  They are not sure what lab was abnormal.  They are unable to access any lab work on [employer].  Level 5 caveat dementia  The history is provided by the patient and a relative.  Illness Severity:  Moderate Onset quality:  Gradual     Past Medical History:  Diagnosis Date   Diabetes mellitus without complication ([place])    HTN (hypertension)    Hyperlipidemia     Patient Active Problem List   Diagnosis Date Noted   Diarrhea 09/21/2021   Hyponatremia 09/01/2021   Bacterial infection due to Staphylococcus 07/30/2021   Dementia ([place]) 04/08/2021   Right knee pain 06/12/2018   Right shoulder pain 11/07/2017   Osteoarthritis 09/13/2016   Diabetes type 2, controlled ([place]) 06/07/2016   HTN (hypertension) 06/07/2016   Vitamin D deficiency 06/07/2016   Hyperlipidemia 06/07/2016    Past Surgical History:  Procedure Laterality Date   ABDOMINAL HYSTERECTOMY     cataract surgery Left 02/04/2021     OB History   No obstetric history on file.     Family History  Problem Relation Age of Onset   Diabetes Mother    Diabetes Brother     Social History   Tobacco Use   Smoking status: Never   Smokeless tobacco: Never  Vaping Use   Vaping Use: Never used  Substance Use Topics   Alcohol use: No    Alcohol/week: 0.0 standard drinks   Drug use: Never    Home  Medications Prior to Admission medications   Medication Sig Start Date End Date Taking? Authorizing Provider  acetaminophen (TYLENOL) 500 MG tablet Take 2 tablets (1,000 mg total) by mouth 2 (two) times daily. 06/19/20   [name], NP  albuterol (VENTOLIN HFA) 108 (90 Base) MCG/ACT inhaler Inhale 2 puffs into the lungs every 6 (six) hours as needed. 12/02/21   Saguier, Percell Miller, PA-C  amLODipine (NORVASC) 10 MG tablet TAKE ONE TABLET BY MOUTH ONE TIME DAILY 10/16/21   [name], NP  atorvastatin (LIPITOR) 80 MG tablet TAKE ONE TABLET BY MOUTH ONE TIME DAILY 10/21/21   [name], NP  azithromycin (ZITHROMAX) 250 MG tablet Take 2 tablets on day 1, then 1 tablet daily on days 2 through 5 12/02/21 12/07/21  Saguier, Percell Miller, PA-C  benzonatate (TESSALON) 100 MG capsule Take 1 capsule (100 mg total) by mouth 3 (three) times daily as needed for cough. 12/02/21   Saguier, Percell Miller, PA-C  cloNIDine (CATAPRES) 0.1 MG tablet Take 1 tablet (0.1 mg total) by mouth daily. 08/05/21 11/03/21  Dessa Phi, DO  Cyanocobalamin (VITAMIN B12 PO) Take 1 tablet by mouth daily.    [provider]  furosemide (LASIX) 40 MG tablet TAKE ONE TABLET BY MOUTH ONE TIME DAILY 10/21/21   [name], NP  hydrALAZINE (APRESOLINE) 25 MG tablet  TAKE ONE TABLET BY MOUTH THREE TIMES A DAY 11/14/21   [name], NP  hydrALAZINE (APRESOLINE) 50 MG tablet Take 1 tablet (50 mg total) by mouth 3 (three) times daily. 08/04/21 11/02/21  Dessa Phi, DO  memantine (NAMENDA) 10 MG tablet Take 1 tablet (10 mg at night) for 2 weeks, then increase to 1 tablet (10 mg) twice a day Patient taking differently: Take 10 mg by mouth 2 (two) times daily. 06/25/21   Rondel Jumbo, PA-C  metoprolol succinate (TOPROL-XL) 100 MG 24 hr tablet TAKE ONE TABLET BY MOUTH ONE TIME DAILY WITH FOOD OR IMMEDIATELY FOLLOWING A MEAL 11/27/21   [name], NP  potassium chloride SA (KLOR-CON) 20 MEQ tablet TAKE  ONE-HALF TABLET BY MOUTH ONE TIME DAILY 10/21/21   [name], NP  traMADol (ULTRAM) 50 MG tablet Take 1 tablet (50 mg total) by mouth every 6 (six) hours as needed. 07/29/21   Margarita Mail, PA-C    Allergies    Lisinopril  Review of Systems   Review of Systems  Unable to perform ROS: Dementia   Physical Exam Updated Vital Signs BP 123/65    Pulse 89    Temp 98.2 F (36.8 C)    Resp 18    Ht 5\' 9"  (1.753 m)    Wt 70.3 kg    SpO2 100%    BMI 22.89 kg/m   Physical Exam Vitals and nursing note reviewed.  Constitutional:      General: She is not in acute distress.    Appearance: She is well-developed. She is not diaphoretic.  HENT:     Head: Normocephalic and atraumatic.  Eyes:     Pupils: Pupils are equal, round, and reactive to light.  Cardiovascular:     Rate and Rhythm: Normal rate and regular rhythm.     Heart sounds: No murmur heard.   No friction rub. No gallop.  Pulmonary:     Effort: Pulmonary effort is normal.     Breath sounds: No wheezing or rales.  Abdominal:     General: There is no distension.     Palpations: Abdomen is soft.     Tenderness: There is no abdominal tenderness.  Musculoskeletal:        General: No tenderness.     Cervical back: Normal range of motion and neck supple.  Skin:    General: Skin is warm and dry.  Neurological:     Mental Status: She is alert.  Psychiatric:        Behavior: Behavior normal.    ED Results / Procedures / Treatments   Labs (all labs ordered are listed, but only abnormal results are displayed) Labs Reviewed  CBC WITH DIFFERENTIAL/PLATELET - Abnormal; Notable for the following components:      Result Value   MCV 78.1 (*)    Monocytes Absolute 1.1 (*)    All other components within normal limits  COMPREHENSIVE METABOLIC PANEL - Abnormal; Notable for the following components:   Chloride 97 (*)    Glucose, Bld 117 (*)    Creatinine, Ser 2.13 (*)    Calcium 8.5 (*)    GFR, Estimated 23 (*)    All  other components within normal limits  TROPONIN I (HIGH SENSITIVITY) - Abnormal; Notable for the following components:   Troponin I (High Sensitivity) 20 (*)    All other components within normal limits    EKG None  Radiology DG Chest 2 View  Result Date: 12/02/2021 CLINICAL DATA:  Intermittent cough for 4 days, dyspnea on exertion EXAM: CHEST - 2 VIEW COMPARISON:  09/13/2021 FINDINGS: The heart size and mediastinal contours are within normal limits. Both lungs are clear. The visualized skeletal structures are unremarkable. IMPRESSION: No active cardiopulmonary disease. Electronically Signed   By: Randa Ngo M.D.   On: 12/02/2021 15:58    Procedures Procedures   Medications Ordered in ED Medications - No data to display  ED Course  I have reviewed the triage vital signs and the nursing notes.  Pertinent labs & imaging results that were available during my care of the patient were reviewed by me and considered in my medical decision making (see chart for details).    MDM Rules/Calculators/A&P                           80 yo F with a chief complaints of chest pain.  At least this is what was reported in her doctor's note from this morning.  Family was called and told that they will come to the ED immediately for some abnormal blood work.  Interestingly I am unable to visualize any results from this morning.  Not sure how or what lab was abnormal.  I feel somewhat compelled to repeat the blood work as I am unable to access any of the labs that were done earlier today.  Since I do not know was abnormal after repeat all the lab work.  Blood work has resulted with mild worsening renal dysfunction.  Troponin is also very mildly elevated at 20.  She is having no ongoing chest pain.  Very atypical in nature based on what I can gather from the family.  I did offer to perform a delta which they are declining.  She has been able to eat and drink and so I discussed giving fluids but they  prefer to eat and drink well at home and have this rechecked in the office.  8:15 PM:  I have discussed the diagnosis/risks/treatment options with the patient and family and believe the pt to be eligible for discharge home to follow-up with PCP. We also discussed returning to the ED immediately if new or worsening sx occur. We discussed the sx which are most concerning (e.g., sudden worsening pain, fever, inability to tolerate by mouth) that necessitate immediate return. Medications administered to the patient during their visit and any new prescriptions provided to the patient are listed below.  Medications given during this visit Medications - No data to display   The patient appears reasonably screen and/or stabilized for discharge and I doubt any other medical condition or other [facility] requiring further screening, evaluation, or treatment in the ED at this time prior to discharge.   Final Clinical Impression(s) / ED Diagnoses Final diagnoses:  Nonspecific chest pain    Rx / DC Orders ED Discharge Orders     None        Deno Etienne, DO 12/02/21 2015

## 2021-12-02 NOTE — Addendum Note (Signed)
Addended by: Anabel Halon on: 12/02/2021 07:47 PM   Modules accepted: Orders

## 2021-12-02 NOTE — Discharge Instructions (Signed)
Follow up with your doctor

## 2021-12-02 NOTE — Patient Instructions (Addendum)
Cough intermittently for 4 days.  Worse at night.  Some shortness of breath coming into the office per my daughter.  O2 sat 99%.  No lower extremity swelling and negative Homans' sign.  On review I do not see that she has history of CHF.  Rapid COVID and flu test were both negative.  EKG sinus rhythm with nonspecific T wave abnormality.  We will get 1 set of troponin.  Patient stated today she has funny sensation in her chest.  Patient has dementia and difficulty explaining how she feels.  Not reporting any chest pain.  Not having any jaw pain or shoulder pain.  Counseled today that with her vague description I do not want to send her to the emergency department unnecessarily.  However will follow cardiac enzymes and if troponin is elevated she will need ED evaluation.  Also asked daughter to continue to monitor closely and if mother reports any chest pain then she has to be seen to the ED for serial repeat troponin studies.  Prescribing benzonatate for cough.  If wheezing or shortness of breath with cough can use albuterol.  We will get a chest x-ray and determine if antibiotic indicated presently.  If x-ray clear then may go ahead and send in azithromycin with instruction to start provided she gets chest congestion or productive cough.  Follow up sooner as needed.

## 2021-12-02 NOTE — Progress Notes (Signed)
Subjective:    Patient ID: Sherry Perez, female    DOB: 1941-07-25, 80 y.o.   MRN: [id number]  HPI  Pt in for cough that has been for 4 days. Pt not bringing up any mucus. No wheezing.   No fever, no chills, no sweats or body aches.  No history of smoking.  No hx of asthma.   Pt has some distal pretbial pain on exam. Some pain in lower legs past 4 days. No popliteal pain.  02 sat 99%.   Review of Systems  Constitutional:  Positive for fatigue. Negative for chills and fever.  Respiratory:  Positive for cough. Negative for chest tightness, shortness of breath and wheezing.        Breathing heavy today per family.  Cardiovascular:  Negative for chest pain and palpitations.  Gastrointestinal:  Negative for abdominal pain and diarrhea.  Genitourinary:  Negative for dysuria and frequency.  Musculoskeletal:  Negative for back pain, joint swelling, neck pain and neck stiffness.  Skin:  Negative for rash.    Past Medical History:  Diagnosis Date   Diabetes mellitus without complication (HCC)    HTN (hypertension)    Hyperlipidemia      Social History   Socioeconomic History   Marital status: Widowed    Spouse name: Not on file   Number of children: Not on file   Years of education: Not on file   Highest education level: Not on file  Occupational History   Not on file  Tobacco Use   Smoking status: Never   Smokeless tobacco: Never  Vaping Use   Vaping Use: Never used  Substance and Sexual Activity   Alcohol use: No    Alcohol/week: 0.0 standard drinks   Drug use: Never   Sexual activity: Not on file  Other Topics Concern   Not on file  Social History Narrative   4 children (3 daughters 1 son)- all local   7 grandchildren    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)   Widowed (husband passed 2016)   Lives with [name]   Enjoys cross word puzzle, adult coloring books, reads bible   Right handed    Social Determinants of Health   Financial Resource  Strain: Low Risk    Difficulty of Paying Living Expenses: Not hard at all  Food Insecurity: No Food Insecurity   Worried About Charity fundraiser in the Last Year: Never true   Ward in the Last Year: Never true  Transportation Needs: No Transportation Needs   Lack of Transportation (Medical): No   Lack of Transportation (Non-Medical): No  Physical Activity: Inactive   Days of Exercise per Week: 0 days   Minutes of Exercise per Session: 0 min  Stress: No Stress Concern Present   Feeling of Stress : Not at all  Social Connections: Moderately Isolated   Frequency of Communication with Friends and Family: More than three times a week   Frequency of Social Gatherings with Friends and Family: More than three times a week   Attends Religious Services: More than 4 times per year   Active Member of [employer] or Organizations: No   Attends Archivist Meetings: Never   Marital Status: Widowed  Human resources officer Violence: Not At Risk   Fear of Current or Ex-Partner: No   Emotionally Abused: No   Physically Abused: No   Sexually Abused: No    Past Surgical History:  Procedure Laterality Date  ABDOMINAL HYSTERECTOMY     cataract surgery Left 02/04/2021    Family History  Problem Relation Age of Onset   Diabetes Mother    Diabetes Brother     Allergies  Allergen Reactions   Lisinopril     Renal insufficiency    Current Outpatient Medications on File Prior to Visit  Medication Sig Dispense Refill   acetaminophen (TYLENOL) 500 MG tablet Take 2 tablets (1,000 mg total) by mouth 2 (two) times daily. 30 tablet 0   amLODipine (NORVASC) 10 MG tablet TAKE ONE TABLET BY MOUTH ONE TIME DAILY 90 tablet 1   atorvastatin (LIPITOR) 80 MG tablet TAKE ONE TABLET BY MOUTH ONE TIME DAILY 90 tablet 1   Cyanocobalamin (VITAMIN B12 PO) Take 1 tablet by mouth daily.     furosemide (LASIX) 40 MG tablet TAKE ONE TABLET BY MOUTH ONE TIME DAILY 90 tablet 1   hydrALAZINE (APRESOLINE) 25  MG tablet TAKE ONE TABLET BY MOUTH THREE TIMES A DAY 270 tablet 1   memantine (NAMENDA) 10 MG tablet Take 1 tablet (10 mg at night) for 2 weeks, then increase to 1 tablet (10 mg) twice a day (Patient taking differently: Take 10 mg by mouth 2 (two) times daily.) 60 tablet 11   metoprolol succinate (TOPROL-XL) 100 MG 24 hr tablet TAKE ONE TABLET BY MOUTH ONE TIME DAILY WITH FOOD OR IMMEDIATELY FOLLOWING A MEAL 90 tablet 1   potassium chloride SA (KLOR-CON) 20 MEQ tablet TAKE ONE-HALF TABLET BY MOUTH ONE TIME DAILY 45 tablet 1   traMADol (ULTRAM) 50 MG tablet Take 1 tablet (50 mg total) by mouth every 6 (six) hours as needed. 15 tablet 0   cloNIDine (CATAPRES) 0.1 MG tablet Take 1 tablet (0.1 mg total) by mouth daily. 30 tablet 2   hydrALAZINE (APRESOLINE) 50 MG tablet Take 1 tablet (50 mg total) by mouth 3 (three) times daily. 90 tablet 2   No current facility-administered medications on file prior to visit.    BP (!) 103/54    Pulse 99    Temp 98.4 F (36.9 C)    Resp 18    Ht 5\' 5"  (1.651 m)    Wt 155 lb (70.3 kg)    SpO2 99%    BMI 25.79 kg/m       Objective:   Physical Exam  General- No acute distress. Pleasant patient. Neck- Full range of motion, no jvd Lungs- Clear, even and unlabored.  Lying supine patient's O2 saturation 99%. Heart- regular rate and rhythm. Neurologic- CNII- XII grossly intact.  Bilateral lower ext- faint distial pretibial tenderness. But no swelling  or warmth. Negative homans sign bilaterally..      Assessment & Plan:   . Patient Instructions  Cough intermittently for 4 days.  Worse at night.  Some shortness of breath coming into the office per my daughter.  O2 sat 99%.  No lower extremity swelling and negative Homans' sign.  On review I do not see that she has history of CHF.  Rapid COVID and flu test were both negative.  EKG sinus rhythm with nonspecific T wave abnormality.  We will get 1 set of troponin.  Patient stated today she has funny sensation in  her chest.  Patient has dementia and difficulty explaining how she feels.  Not reporting any chest pain.  Not having any jaw pain or shoulder pain.  Counseled today that with her vague description I do not want to send her to the emergency department unnecessarily.  However will follow cardiac enzymes and if troponin is elevated she will need ED evaluation.  Also asked daughter to continue to monitor closely and if mother reports any chest pain then she has to be seen to the ED for serial repeat troponin studies.  Prescribing benzonatate for cough.  If wheezing or shortness of breath with cough can use albuterol.  We will get a chest x-ray and determine if antibiotic indicated presently.  If x-ray clear then may go ahead and send in azithromycin with instruction to start provided she gets chest congestion or productive cough.  Follow up sooner as needed.   Time spent with patient today was  45 minutes which consisted of chart revdiew, discussing diagnosis, work up treatment and documentation.

## 2021-12-03 ENCOUNTER — Telehealth: Payer: Self-pay

## 2021-12-03 LAB — CBC WITH DIFFERENTIAL/PLATELET
Basophils Absolute: 0 10*3/uL (ref 0.0–0.1)
Basophils Relative: 0.6 % (ref 0.0–3.0)
Eosinophils Absolute: 0 10*3/uL (ref 0.0–0.7)
Eosinophils Relative: 0.4 % (ref 0.0–5.0)
HCT: 37.2 % (ref 36.0–46.0)
Hemoglobin: 12.1 g/dL (ref 12.0–15.0)
Lymphocytes Relative: 13 % (ref 12.0–46.0)
Lymphs Abs: 0.9 10*3/uL (ref 0.7–4.0)
MCHC: 32.4 g/dL (ref 30.0–36.0)
MCV: 80.7 fl (ref 78.0–100.0)
Monocytes Absolute: 0.6 10*3/uL (ref 0.1–1.0)
Monocytes Relative: 9.5 % (ref 3.0–12.0)
Neutro Abs: 5.2 10*3/uL (ref 1.4–7.7)
Neutrophils Relative %: 76.5 % (ref 43.0–77.0)
Platelets: 295 10*3/uL (ref 150.0–400.0)
RBC: 4.61 Mil/uL (ref 3.87–5.11)
RDW: 14.5 % (ref 11.5–15.5)
WBC: 6.8 10*3/uL (ref 4.0–10.5)

## 2021-12-03 NOTE — Telephone Encounter (Signed)
Nurse Assessment Nurse: [name], RN, [name] Date/Time (Eastern Time): 12/02/2021 5:11:18 PM Is there an on-call provider listed? ---Yes Please list name of person reporting value (Lab Employee) and a contact number. ---[name] [phone] Please document the following items: Lab name Lab value (read back to lab to verify) Reference range for lab value Date and time blood was drawn Collect time of birth for bilirubin results ---[name] Troponin 24 Reference Range (2-17) Date and time Drawn 1522 12/02/2021 No prior Troponin Please collect the patient contact information from the lab. (name, phone number and address) ---Sherry Perez [phone] [address] [name] [address] List any special notes provided by lab. ---[name] Disp. Time [name] Time) Disposition Final User 12/02/2021 5:17:00 PM Called On-Call Provider [name], RN, [name] 12/02/2021 5:17:47 PM Paged On Call back to [facility], RN, [name] 12/02/2021 5:19:00 PM Lab Call [name], RN, [name] Reason: See Lab results 12/02/2021 5:19:24 PM Paged On Call back to [facility] [place], [name] 12/02/2021 5:43:13 PM Called On-Call Provider [name], RN, [name] 12/02/2021 5:19:10 PM Clinical Call Yes [name], RN, [name] PLEASE NOTE: All timestamps contained within this report are represented as [place] Standard Time. CONFIDENTIALTY NOTICE: This fax transmission is intended only for the addressee. It contains information that is legally privileged, confidential or otherwise protected from use or disclosure. If you are not the intended recipient, you are strictly prohibited from reviewing, disclosing, copying using or disseminating any of this information or taking any action in reliance on or regarding this information. If you have received this fax in error, please notify us immediately by telephone so that we can arrange for its  return to [place]. Phone: [phone], Toll-Free: [phone], Fax: [phone] Page: 2 of 2 Call Id: [id number] Comments User: [name], RN Date/Time [name] Time): 12/02/2021 5:45:57 PM Callers daughter updated with MD response Paging DoctorName Phone DateTime Result/ Outcome Message Type Notes [name] [phone] 12/02/2021 5:17:00 PM Called On Call Provider - Reached Doctor Paged [name] 12/02/2021 5:17:13 PM Spoke with On Call - General Message Result Will call me back in 20 min [name] [phone] 12/02/2021 5:43:13 PM Called On Call Provider - Reached Doctor Paged [name] 12/02/2021 5:44:22 PM Spoke with On Call - [name] a ED evaluation. Update the daughter and let her Know   Pt seen in ED.

## 2021-12-07 ENCOUNTER — Telehealth (INDEPENDENT_AMBULATORY_CARE_PROVIDER_SITE_OTHER): Payer: Medicare HMO | Admitting: Physician Assistant

## 2021-12-07 ENCOUNTER — Other Ambulatory Visit: Payer: Self-pay

## 2021-12-07 ENCOUNTER — Encounter: Payer: Self-pay | Admitting: Physician Assistant

## 2021-12-07 VITALS — Ht 69.0 in | Wt 155.0 lb

## 2021-12-07 DIAGNOSIS — G309 Alzheimer's disease, unspecified: Secondary | ICD-10-CM

## 2021-12-07 DIAGNOSIS — F02818 Dementia in other diseases classified elsewhere, unspecified severity, with other behavioral disturbance: Secondary | ICD-10-CM | POA: Diagnosis not present

## 2021-12-07 DIAGNOSIS — R69 Illness, unspecified: Secondary | ICD-10-CM | POA: Diagnosis not present

## 2021-12-07 NOTE — Patient Instructions (Signed)
It was a pleasure to see you today at our office.   Recommendations:  Meds: Follow up in 6  months Continue Memantine 10 mg twice daily.Side effects were discussed    RECOMMENDATIONS FOR ALL PATIENTS WITH MEMORY PROBLEMS: 1. Continue to exercise (Recommend 30 minutes of walking everyday, or 3 hours every week) 2. Increase social interactions - continue going to [place] and enjoy social gatherings with friends and family 3. Eat healthy, avoid fried foods and eat more fruits and vegetables 4. Maintain adequate blood pressure, blood sugar, and blood cholesterol level. Reducing the risk of stroke and cardiovascular disease also helps promoting better memory. 5. Avoid stressful situations. Live a simple life and avoid aggravations. Organize your time and prepare for the next day in anticipation. 6. Sleep well, avoid any interruptions of sleep and avoid any distractions in the bedroom that may interfere with adequate sleep quality 7. Avoid sugar, avoid sweets as there is a strong link between excessive sugar intake, diabetes, and cognitive impairment We discussed the Mediterranean diet, which has been shown to help patients reduce the risk of progressive memory disorders and reduces cardiovascular risk. This includes eating fish, eat fruits and green leafy vegetables, nuts like almonds and hazelnuts, walnuts, and also use olive oil. Avoid fast foods and fried foods as much as possible. Avoid sweets and sugar as sugar use has been linked to worsening of memory function.  There is always a concern of gradual progression of memory problems. If this is the case, then we may need to adjust level of care according to patient needs. Support, both to the patient and caregiver, should then be put into place.    The Alzheimers Association is here all day, every day for people facing Alzheimers disease through our free 24/7 Helpline: 9597712327. The Helpline provides reliable information and support to all  those who need assistance, such as individuals living with memory loss, Alzheimer's or other dementia, caregivers, health care professionals and the public.  Our highly trained and knowledgeable staff can help you with: Understanding memory loss, dementia and Alzheimer's  Medications and other treatment options  General information about aging and brain health  Skills to provide quality care and to find the best care from professionals  Legal, financial and living-arrangement decisions Our Helpline also features: Confidential care consultation provided by master's level clinicians who can help with decision-making support, crisis assistance and education on issues families face every day  Help in a caller's preferred language using our translation service that features more than 200 languages and dialects  Referrals to local community programs, services and ongoing support     FALL PRECAUTIONS: Be cautious when walking. Scan the area for obstacles that may increase the risk of trips and falls. When getting up in the mornings, sit up at the edge of the bed for a few minutes before getting out of bed. Consider elevating the bed at the head end to avoid drop of blood pressure when getting up. Walk always in a well-lit room (use night lights in the walls). Avoid area rugs or power cords from appliances in the middle of the walkways. Use a walker or a cane if necessary and consider physical therapy for balance exercise. Get your eyesight checked regularly.  FINANCIAL OVERSIGHT: Supervision, especially oversight when making financial decisions or transactions is also recommended.  HOME SAFETY: Consider the safety of the kitchen when operating appliances like stoves, microwave oven, and blender. Consider having supervision and share cooking responsibilities until no longer  able to participate in those. Accidents with firearms and other hazards in the house should be identified and addressed as  well.   ABILITY TO BE LEFT ALONE: If patient is unable to contact 911 operator, consider using LifeLine, or when the need is there, arrange for someone to stay with patients. Smoking is a fire hazard, consider supervision or cessation. Risk of wandering should be assessed by caregiver and if detected at any point, supervision and safe proof recommendations should be instituted.  MEDICATION SUPERVISION: Inability to self-administer medication needs to be constantly addressed. Implement a mechanism to ensure safe administration of the medications.   DRIVING: Regarding driving, in patients with progressive memory problems, driving will be impaired. We advise to have someone else do the driving if trouble finding directions or if minor accidents are reported. Independent driving assessment is available to determine safety of driving.   If you are interested in the driving assessment, you can contact the following:  [employer] in [place]  [place] [phone] or [phone]      [place] refers to food and lifestyle choices that are based on the traditions of countries located on the [employer]. This way of eating has been shown to help prevent certain conditions and improve outcomes for people who have chronic diseases, like kidney disease and heart disease. What are tips for following this plan? Lifestyle  Cook and eat meals together with your family, when possible. Drink enough fluid to keep your urine clear or pale yellow. Be physically active every day. This includes: Aerobic exercise like running or swimming. Leisure activities like gardening, walking, or housework. Get 7-8 hours of sleep each night. If recommended by your health care provider, drink red wine in moderation. This means 1 glass a day for nonpregnant women and 2  glasses a day for men. A glass of wine equals 5 oz (150 mL). Reading food labels  Check the serving size of packaged foods. For foods such as rice and pasta, the serving size refers to the amount of cooked product, not dry. Check the total fat in packaged foods. Avoid foods that have saturated fat or trans fats. Check the ingredients list for added sugars, such as corn syrup. Shopping  At the grocery store, buy most of your food from the areas near the walls of the store. This includes: Fresh fruits and vegetables (produce). Grains, beans, nuts, and seeds. Some of these may be available in unpackaged forms or large amounts (in bulk). Fresh seafood. Poultry and eggs. Low-fat dairy products. Buy whole ingredients instead of prepackaged foods. Buy fresh fruits and vegetables in-season from local farmers markets. Buy frozen fruits and vegetables in resealable bags. If you do not have access to quality fresh seafood, buy precooked frozen shrimp or canned fish, such as tuna, salmon, or sardines. Buy small amounts of raw or cooked vegetables, salads, or olives from the deli or salad bar at your store. Stock your pantry so you always have certain foods on hand, such as olive oil, canned tuna, canned tomatoes, rice, pasta, and beans. Cooking  Cook foods with extra-virgin olive oil instead of using butter or other vegetable oils. Have meat as a side dish, and have vegetables or grains as your main dish. This means having meat in small portions or adding small amounts of meat to foods like pasta or stew. Use beans or vegetables instead of meat in  common dishes like chili or lasagna. Experiment with different cooking methods. Try roasting or broiling vegetables instead of steaming or sauteing them. Add frozen vegetables to soups, stews, pasta, or rice. Add nuts or seeds for added healthy fat at each meal. You can add these to yogurt, salads, or vegetable dishes. Marinate fish or vegetables using olive  oil, lemon juice, garlic, and fresh herbs. Meal planning  Plan to eat 1 vegetarian meal one day each week. Try to work up to 2 vegetarian meals, if possible. Eat seafood 2 or more times a week. Have healthy snacks readily available, such as: Vegetable sticks with hummus. Greek yogurt. Fruit and nut trail mix. Eat balanced meals throughout the week. This includes: Fruit: 2-3 servings a day Vegetables: 4-5 servings a day Low-fat dairy: 2 servings a day Fish, poultry, or lean meat: 1 serving a day Beans and legumes: 2 or more servings a week Nuts and seeds: 1-2 servings a day Whole grains: 6-8 servings a day Extra-virgin olive oil: 3-4 servings a day Limit red meat and sweets to only a few servings a month What are my food choices? Mediterranean diet Recommended Grains: Whole-grain pasta. Brown rice. Bulgar wheat. Polenta. Couscous. Whole-wheat bread. [place]. Vegetables: Artichokes. Beets. Broccoli. Cabbage. Carrots. Eggplant. Green beans. Chard. Kale. Spinach. Onions. Leeks. Peas. Squash. Tomatoes. Peppers. Radishes. Fruits: Apples. Apricots. Avocado. Berries. Bananas. Cherries. Dates. Figs. Grapes. Lemons. Melon. Oranges. Peaches. Plums. Pomegranate. Meats and other protein foods: Beans. Almonds. Sunflower seeds. Pine nuts. Peanuts. [place]. Salmon. Scallops. Shrimp. [place]. Tilapia. Clams. Oysters. Eggs. Dairy: Low-fat milk. Cheese. Greek yogurt. Beverages: Water. Red wine. Herbal tea. Fats and oils: Extra virgin olive oil. Avocado oil. Grape seed oil. Sweets and desserts: [place] yogurt with honey. Baked apples. Poached pears. Trail mix. Seasoning and other foods: Basil. Cilantro. Coriander. Cumin. Mint. Parsley. Sage. Rosemary. Tarragon. Garlic. Oregano. Thyme. Pepper. Balsalmic vinegar. Tahini. Hummus. Tomato sauce. Olives. Mushrooms. Limit these Grains: Prepackaged pasta or rice dishes. Prepackaged cereal with added sugar. Vegetables: Deep fried potatoes (french fries). Fruits:  Fruit canned in syrup. Meats and other protein foods: Beef. Pork. Lamb. Poultry with skin. Hot dogs. Berniece Salines. Dairy: Ice cream. Sour cream. Whole milk. Beverages: Juice. Sugar-sweetened soft drinks. Beer. Liquor and spirits. Fats and oils: Butter. Canola oil. Vegetable oil. Beef fat (tallow). Lard. Sweets and desserts: Cookies. Cakes. Pies. Candy. Seasoning and other foods: Mayonnaise. Premade sauces and marinades. The items listed may not be a complete list. Talk with your dietitian about what dietary choices are right for you. Summary The Mediterranean diet includes both food and lifestyle choices. Eat a variety of fresh fruits and vegetables, beans, nuts, seeds, and whole grains. Limit the amount of red meat and sweets that you eat. Talk with your health care provider about whether it is safe for you to drink red wine in moderation. This means 1 glass a day for nonpregnant women and 2 glasses a day for men. A glass of wine equals 5 oz (150 mL). This information is not intended to replace advice given to you by your health care provider. Make sure you discuss any questions you have with your health care provider. Document Released: 07/29/2016 Document Revised: 08/31/2016 Document Reviewed: 07/29/2016 Elsevier Interactive Patient Education  2017 [employer].

## 2021-12-07 NOTE — Progress Notes (Signed)
Virtual Visit via Video Note The purpose of this virtual visit is to provide medical care while limiting exposure to the novel coronavirus.    Consent was obtained for video visit:  Yes.   Answered questions that patient had about telehealth interaction:  Yes.   I discussed the limitations, risks, security and privacy concerns of performing an evaluation and management service by telemedicine. I also discussed with the patient that there may be a patient responsible charge related to this service. The patient expressed understanding and agreed to proceed.  Pt location: Home Physician Location: office Name of referring provider:  [name], NP I connected with Sherry Perez at patients initiation/request on 12/07/2021 at  2:30 PM EST by video enabled telemedicine application and verified that I am speaking with the correct person using two identifiers. Pt MRN:  [id number] Pt DOB:  08-17-1941 Video Participants:  Sherry Perez;  daughter [name]    History of Present Illness:   80 y.o. year old RH female with a history of hypertension, hyperlipidemia, CAD, anxiety, depression and  seen today for evaluation of memory loss.  She carries a diagnosis of dementia with behavioral disturbance.  Her last MoCA in June 2022 was 6/30.  Prior CT of the head on 02/24/2021 showed mild cerebral volume loss without acute intracranial findings.  Since her last visit, the patient has no further hallucinations.  Daughter agrees.  Prior to that, she was seeing people in the trees, but this has not been present since initiating memantine 10 mg twice daily, tolerating well.  Of note, the patient was initially on donepezil, but was unable to tolerate, with significant diarrhea, and was replaced for memantine on 06/25/2021.  She denies any paranoia or irritability.  Her long-term memory is fair.  Her short-term memory is about the same, occasionally she forgets people's names.  She continues to be in a very good  mood, excited about the holidays.  She is expecting people during Christmas.  She does simple activities, including making the bed, taking the trash, bring in the mail home, and she continues to ambulate as tolerated.  She also likes to do crossword puzzles and word finding.  She denies leaving objects in unusual places.  Her sleep is good, about 6 to 7 hours, with napping around 1130 till 12 PM.  She denies any vivid dreams, nightmares, or sleepwalking.  Her appetite is good, denies trouble swallowing.  She continues to cook simple recipes with close monitoring.  She ambulates without difficulty without the use of a walker or a cane.  She no longer drives.  Medications are monitored by her daughter.Denies headaches, trauma, or injuries to the head, double vision, dizziness, focal numbness or tingling, unilateral weakness or tremors.  She is incontinent of urine and wears pads or diapers, but denies constipation or diarrhea. Denies history of OSA, ETOH  Tobacco.     Initial Visit 06/12/21 80 y.o. year old female with risk factors including hypertension, hyperlipidemia, CAD, anxiety, depression and  seen today for evaluation of memory loss.  MoCA today is 6/30 with deficiencies in visuospatial, naming, attention, language, abstraction, and delayed recall, as well as orientation, suspicious for dementia.  She had a prior CT of the head on 02/25/2028, which showed mild cerebral volume loss, but no acute intracranial findings.  The patient is seen in neurologic consultation at the request of [name], NP for the evaluation of memory.  The patient is accompanied by daughter [name] who supplements the  history. The patient is a 80 y.o. year old female who has had memory issues for about 3 years when she began to see "people in the porch, hiding in the trees ", but daughter reports that over the last 6 months, they are now "men hiding  in the trees, and they are naked".  No other hallucinations, paranoia or  irritability.   Her long-term memory is fair,  and her short term memory is deficient, "but she remains herself jolly ".  At home, she does simple activities of daily living, which include making the bed, taking the trash, and bringing the mail home "she is still around a lot"- daughter says.  She denies leaving objects in unusual places.  She denies any vivid dreams, or sleepwalking.  She sleeps all night, naps 1 or 2 times a day.  Her appetite is good, and denies trouble swallowing.  She cooks simple recipes, and at times, she forgets to leave the stove off.  She is independent of bathing and dressing.  She states the medications herself, missing some doses, but the daughter tries to "stay on top of it".  She does not do the finances, daughter pays the bills. She ambulates without difficulty, without a use of walker or cane.  She no longer drives for at least 4 years.  She denies having gotten lost in her car. Denies headaches, trauma, or injuries to the head, double vision, dizziness, focal numbness or tingling, unilateral weakness or tremors.  She is incontinent of urine and wears pads or diapers, but denies constipation or diarrhea. Denies history of OSA, ETOH  Tobacco. Family History negative for dementia.  Family lives in [place], has 4 children.  She is widowed      Current Outpatient Medications on File Prior to Visit  Medication Sig Dispense Refill   acetaminophen (TYLENOL) 500 MG tablet Take 2 tablets (1,000 mg total) by mouth 2 (two) times daily. 30 tablet 0   albuterol (VENTOLIN HFA) 108 (90 Base) MCG/ACT inhaler Inhale 2 puffs into the lungs every 6 (six) hours as needed. 18 g 0   amLODipine (NORVASC) 10 MG tablet TAKE ONE TABLET BY MOUTH ONE TIME DAILY 90 tablet 1   atorvastatin (LIPITOR) 80 MG tablet TAKE ONE TABLET BY MOUTH ONE TIME DAILY 90 tablet 1   azithromycin (ZITHROMAX) 250 MG tablet Take 2 tablets on day 1, then 1 tablet daily on days 2 through 5 6 tablet 0   benzonatate  (TESSALON) 100 MG capsule Take 1 capsule (100 mg total) by mouth 3 (three) times daily as needed for cough. 30 capsule 0   Cyanocobalamin (VITAMIN B12 PO) Take 1 tablet by mouth daily.     furosemide (LASIX) 40 MG tablet TAKE ONE TABLET BY MOUTH ONE TIME DAILY 90 tablet 1   hydrALAZINE (APRESOLINE) 25 MG tablet TAKE ONE TABLET BY MOUTH THREE TIMES A DAY 270 tablet 1   memantine (NAMENDA) 10 MG tablet Take 1 tablet (10 mg at night) for 2 weeks, then increase to 1 tablet (10 mg) twice a day (Patient taking differently: Take 10 mg by mouth 2 (two) times daily.) 60 tablet 11   metoprolol succinate (TOPROL-XL) 100 MG 24 hr tablet TAKE ONE TABLET BY MOUTH ONE TIME DAILY WITH FOOD OR IMMEDIATELY FOLLOWING A MEAL 90 tablet 1   potassium chloride SA (KLOR-CON) 20 MEQ tablet TAKE ONE-HALF TABLET BY MOUTH ONE TIME DAILY 45 tablet 1   cloNIDine (CATAPRES) 0.1 MG tablet Take 1 tablet (0.1  mg total) by mouth daily. 30 tablet 2   hydrALAZINE (APRESOLINE) 50 MG tablet Take 1 tablet (50 mg total) by mouth 3 (three) times daily. 90 tablet 2   traMADol (ULTRAM) 50 MG tablet Take 1 tablet (50 mg total) by mouth every 6 (six) hours as needed. (Patient not taking: Reported on 12/07/2021) 15 tablet 0   No current facility-administered medications on file prior to visit.     Observations/Objective:   Vitals:   12/07/21 0908  Weight: 155 lb (70.3 kg)  Height: 5\' 9"  (1.753 m)   GEN:  The patient appears stated age and is in NAD.  Neurological examination: Patient is awake, alert, oriented x 3. No aphasia or dysarthria. Intact fluency and comprehension. Remote and recent memory intact. Able to name and repeat. Cranial nerves: Extraocular movements intact with no nystagmus. No facial asymmetry. Motor: moves all extremities symmetrically, at least anti-gravity x 4. No incoordination on finger to nose testing. Gait: narrow-based and steady, able to tandem walk adequately. Negative Romberg test.    Assessment and Plan:     Dementia with behavioral disturbance, likely due to Alzheimer's disease   Discussed safety both in and out of the home.  Discussed the importance of regular daily schedule  Continue to monitor mood with PCP.  Stay active at least 30 minutes at least 3 times a week.  Naps should be scheduled and should be no longer than 60 minutes and should not occur after 2 PM.  Mediterranean diet is recommended  Continue Memantine 10 mg twice daily.Side effects were discussed   Folllow up in 6 months   Follow Up Instructions:    -I discussed the assessment and treatment plan with the patient. The patient was provided an opportunity to ask questions and all were answered. The patient agreed with the plan and demonstrated an understanding of the instructions.   The patient was advised to call back or seek an in-person evaluation if the symptoms worsen or if the condition fails to improve as anticipated.    Total time spent on today's visit was 11.19 minutes, including both face-to-face time and nonface-to-face time.  Time included that spent on review of records (prior notes available to me/labs/imaging if pertinent), discussing treatment and goals, answering patient's questions and coordinating care.   Sharene Butters, PA-C

## 2021-12-22 ENCOUNTER — Ambulatory Visit (INDEPENDENT_AMBULATORY_CARE_PROVIDER_SITE_OTHER): Payer: Medicare Other | Admitting: Family

## 2021-12-22 DIAGNOSIS — F03B0 Unspecified dementia, moderate, without behavioral disturbance, psychotic disturbance, mood disturbance, and anxiety: Secondary | ICD-10-CM

## 2021-12-22 DIAGNOSIS — I10 Essential (primary) hypertension: Secondary | ICD-10-CM

## 2021-12-22 DIAGNOSIS — R7303 Prediabetes: Secondary | ICD-10-CM | POA: Diagnosis not present

## 2021-12-22 NOTE — Assessment & Plan Note (Signed)
Uncontrolled. It was wnl when she was staying with another family member. She is only taking  Hydralazine 25mg  BID. I advised pt and daughter to increase to TID and to make sure that someone observes her taking her doses each time to make sure she is on time and not forgetting.

## 2021-12-22 NOTE — Assessment & Plan Note (Signed)
A1C has been WNL for several years. Monitor.

## 2021-12-22 NOTE — Assessment & Plan Note (Signed)
Stable. Continue namenda.

## 2021-12-22 NOTE — Patient Instructions (Addendum)
Please get your bivalent Covid Booster at your pharmacy. Please get your second shingles shot.

## 2021-12-22 NOTE — Progress Notes (Signed)
Subjective:     Patient ID: Sherry Perez, female    DOB: August 30, 1941, 81 y.o.   MRN: [id number]  Chief Complaint  Patient presents with   Hypertension    Here for follow up   Diabetes    Here for follow    Hypertension  Diabetes  Patient is in today for follow up. She is accompanied by her daughter [name], with whom she is currently living.  HTN- maintained on amlodipine, metoprolol hydralazine and clonidine.  BP Readings from Last 3 Encounters:  12/22/21 (!) 180/54  12/02/21 123/65  12/02/21 (!) 103/54   DM2-  Lab Results  Component Value Date   HGBA1C 5.5 10/10/2020   HGBA1C 5.8 06/18/2020   HGBA1C 5.5 10/09/2019   Lab Results  Component Value Date   MICROALBUR 0.7 06/07/2016   LDLCALC 91 08/18/2021   CREATININE 2.13 (H) 12/02/2021   Hyperlipidemia-  Lab Results  Component Value Date   CHOL 192 08/18/2021   HDL 88.60 08/18/2021   [place] 91 08/18/2021   TRIG 62.0 08/18/2021   CHOLHDL 2 08/18/2021     Health Maintenance Due  Topic Date Due   Pneumonia Vaccine 36+ Years old (2 - PPSV23 if available, else PCV20) 05/30/2019   COVID-19 Vaccine (4 - Booster for Pfizer series) 08/05/2021   Zoster Vaccines- Shingrix (2 of 2) 08/05/2021    Past Medical History:  Diagnosis Date   Diabetes mellitus without complication ([place])    HTN (hypertension)    Hyperlipidemia     Past Surgical History:  Procedure Laterality Date   ABDOMINAL HYSTERECTOMY     cataract surgery Left 02/04/2021    Family History  Problem Relation Age of Onset   Diabetes Mother    Diabetes Brother     Social History   Socioeconomic History   Marital status: Widowed    Spouse name: Not on file   Number of children: Not on file   Years of education: Not on file   Highest education level: Not on file  Occupational History   Not on file  Tobacco Use   Smoking status: Never   Smokeless tobacco: Never  Vaping Use   Vaping Use: Never used  Substance and Sexual Activity    Alcohol use: No    Alcohol/week: 0.0 standard drinks   Drug use: Never   Sexual activity: Not on file  Other Topics Concern   Not on file  Social History Narrative   4 children (3 daughters 1 son)- all local   7 grandchildren    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)   Widowed (husband passed 2016)   Lives with [name]   Enjoys cross word puzzle, adult coloring books, reads bible   Right handed    Social Determinants of Health   Financial Resource Strain: Low Risk    Difficulty of Paying Living Expenses: Not hard at all  Food Insecurity: No Food Insecurity   Worried About Charity fundraiser in the Last Year: Never true   [place] in the Last Year: Never true  Transportation Needs: No Transportation Needs   Lack of Transportation (Medical): No   Lack of Transportation (Non-Medical): No  Physical Activity: Inactive   Days of Exercise per Week: 0 days   Minutes of Exercise per Session: 0 min  Stress: No Stress Concern Present   Feeling of Stress : Not at all  Social Connections: Moderately Isolated   Frequency of Communication with Friends and  Family: More than three times a week   Frequency of Social Gatherings with Friends and Family: More than three times a week   Attends Religious Services: More than 4 times per year   Active Member of Clubs or Organizations: No   Attends Archivist Meetings: Never   Marital Status: Widowed  Human resources officer Violence: Not At Risk   Fear of Current or Ex-Partner: No   Emotionally Abused: No   Physically Abused: No   Sexually Abused: No    Outpatient Medications Prior to Visit  Medication Sig Dispense Refill   acetaminophen (TYLENOL) 500 MG tablet Take 2 tablets (1,000 mg total) by mouth 2 (two) times daily. 30 tablet 0   albuterol (VENTOLIN HFA) 108 (90 Base) MCG/ACT inhaler Inhale 2 puffs into the lungs every 6 (six) hours as needed. 18 g 0   amLODipine (NORVASC) 10 MG tablet TAKE ONE TABLET BY MOUTH ONE TIME  DAILY 90 tablet 1   atorvastatin (LIPITOR) 80 MG tablet TAKE ONE TABLET BY MOUTH ONE TIME DAILY 90 tablet 1   benzonatate (TESSALON) 100 MG capsule Take 1 capsule (100 mg total) by mouth 3 (three) times daily as needed for cough. 30 capsule 0   cloNIDine (CATAPRES) 0.1 MG tablet Take 1 tablet (0.1 mg total) by mouth daily. 30 tablet 2   Cyanocobalamin (VITAMIN B12 PO) Take 1 tablet by mouth daily.     furosemide (LASIX) 40 MG tablet TAKE ONE TABLET BY MOUTH ONE TIME DAILY 90 tablet 1   hydrALAZINE (APRESOLINE) 25 MG tablet TAKE ONE TABLET BY MOUTH THREE TIMES A DAY 270 tablet 1   memantine (NAMENDA) 10 MG tablet Take 1 tablet (10 mg at night) for 2 weeks, then increase to 1 tablet (10 mg) twice a day (Patient taking differently: Take 10 mg by mouth 2 (two) times daily.) 60 tablet 11   metoprolol succinate (TOPROL-XL) 100 MG 24 hr tablet TAKE ONE TABLET BY MOUTH ONE TIME DAILY WITH FOOD OR IMMEDIATELY FOLLOWING A MEAL 90 tablet 1   potassium chloride SA (KLOR-CON) 20 MEQ tablet TAKE ONE-HALF TABLET BY MOUTH ONE TIME DAILY 45 tablet 1   hydrALAZINE (APRESOLINE) 50 MG tablet Take 1 tablet (50 mg total) by mouth 3 (three) times daily. 90 tablet 2   traMADol (ULTRAM) 50 MG tablet Take 1 tablet (50 mg total) by mouth every 6 (six) hours as needed. (Patient not taking: Reported on 12/07/2021) 15 tablet 0   No facility-administered medications prior to visit.    Allergies  Allergen Reactions   Lisinopril     Renal insufficiency    ROS See HPI    Objective:    Physical Exam Constitutional:      General: She is not in acute distress.    Appearance: Normal appearance. She is well-developed.  HENT:     Head: Normocephalic and atraumatic.     Right Ear: External ear normal.     Left Ear: External ear normal.  Eyes:     General: No scleral icterus. Neck:     Thyroid: No thyromegaly.  Cardiovascular:     Rate and Rhythm: Normal rate and regular rhythm.     Heart sounds: Normal heart sounds.  No murmur heard. Pulmonary:     Effort: Pulmonary effort is normal. No respiratory distress.     Breath sounds: Normal breath sounds. No wheezing.  Musculoskeletal:        General: Swelling (2+ bilateral LE edema) present.     Cervical  back: Neck supple.  Skin:    General: Skin is warm and dry.  Neurological:     Mental Status: She is alert and oriented to person, place, and time.  Psychiatric:        Mood and Affect: Mood normal.        Behavior: Behavior normal.        Thought Content: Thought content normal.        Judgment: Judgment normal.    BP (!) 180/54 (BP Location: Right Arm, Patient Position: Sitting, Cuff Size: Small)    Pulse (!) 54    Temp 99 F (37.2 C) (Oral)    Resp 16    Wt 157 lb (71.2 kg)    SpO2 100%    BMI 23.18 kg/m  Wt Readings from Last 3 Encounters:  12/22/21 157 lb (71.2 kg)  12/07/21 155 lb (70.3 kg)  12/02/21 155 lb (70.3 kg)       Assessment & Plan:   Problem List Items Addressed This Visit       Unprioritized   HTN (hypertension)    Uncontrolled. It was wnl when she was staying with another family member. She is only taking  Hydralazine 25mg  BID. I advised pt and daughter to increase to TID and to make sure that someone observes her taking her doses each time to make sure she is on time and not forgetting.       Dementia ([place])    Stable. Continue namenda.      Borderline diabetes    A1C has been WNL for several years. Monitor.        I have discontinued Laquita L. Melling's traMADol. I am also having her maintain her acetaminophen, memantine, Cyanocobalamin (VITAMIN B12 PO), cloNIDine, amLODipine, atorvastatin, potassium chloride SA, furosemide, hydrALAZINE, metoprolol succinate, benzonatate, and albuterol.  No orders of the defined types were placed in this encounter.

## 2021-12-30 ENCOUNTER — Encounter: Payer: Self-pay | Admitting: Medical

## 2022-01-05 ENCOUNTER — Ambulatory Visit (INDEPENDENT_AMBULATORY_CARE_PROVIDER_SITE_OTHER): Payer: Medicare Other | Admitting: Family

## 2022-01-05 DIAGNOSIS — I10 Essential (primary) hypertension: Secondary | ICD-10-CM

## 2022-01-05 DIAGNOSIS — G8929 Other chronic pain: Secondary | ICD-10-CM

## 2022-01-05 DIAGNOSIS — M25561 Pain in right knee: Secondary | ICD-10-CM

## 2022-01-05 NOTE — Assessment & Plan Note (Signed)
BP is improving.  Continue current meds/doses.   BP Readings from Last 3 Encounters:  01/05/22 (!) 142/55  12/22/21 (!) 180/54  12/02/21 123/65

## 2022-01-05 NOTE — Assessment & Plan Note (Signed)
Offered referral to ortho- she declines. Will continue prn tylenol.

## 2022-01-05 NOTE — Progress Notes (Signed)
Subjective:     Patient ID: Sherry Perez, female    DOB: 1941-05-17, 81 y.o.   MRN: [id number]  Chief Complaint  Patient presents with   Hypertension    Here for follow up   Knee Pain    Complains of right knee pain    Hypertension  Knee Pain    Patient presents today with her daughter for follow up of her hypertension.     HTN- Last visit we increased her hydralazine to TID as she was only taking bid. She continues her other medications under supervision of her daughter.  BP Readings from Last 3 Encounters:  01/05/22 (!) 142/55  12/22/21 (!) 180/54  12/02/21 123/65   Reports right sided knee pain.  She reports some improvement with tylenol.   Health Maintenance Due  Topic Date Due   Pneumonia Vaccine 40+ Years old (2 - PPSV23 if available, else PCV20) 05/30/2019   COVID-19 Vaccine (4 - Booster for Pfizer series) 08/05/2021   Zoster Vaccines- Shingrix (2 of 2) 08/05/2021    Past Medical History:  Diagnosis Date   Diabetes mellitus without complication (HCC)    HTN (hypertension)    Hyperlipidemia     Past Surgical History:  Procedure Laterality Date   ABDOMINAL HYSTERECTOMY     cataract surgery Left 02/04/2021    Family History  Problem Relation Age of Onset   Diabetes Mother    Diabetes Brother     Social History   Socioeconomic History   Marital status: Widowed    Spouse name: Not on file   Number of children: Not on file   Years of education: Not on file   Highest education level: Not on file  Occupational History   Not on file  Tobacco Use   Smoking status: Never   Smokeless tobacco: Never  Vaping Use   Vaping Use: Never used  Substance and Sexual Activity   Alcohol use: No    Alcohol/week: 0.0 standard drinks   Drug use: Never   Sexual activity: Not on file  Other Topics Concern   Not on file  Social History Narrative   4 children (3 daughters 1 son)- all local   7 grandchildren    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)    Widowed (husband passed 2016)   Lives with [name]   Enjoys cross word puzzle, adult coloring books, reads bible   Right handed    Social Determinants of Health   Financial Resource Strain: Low Risk    Difficulty of Paying Living Expenses: Not hard at all  Food Insecurity: No Food Insecurity   Worried About Charity fundraiser in the Last Year: Never true   [place] in the Last Year: Never true  Transportation Needs: No Transportation Needs   Lack of Transportation (Medical): No   Lack of Transportation (Non-Medical): No  Physical Activity: Inactive   Days of Exercise per Week: 0 days   Minutes of Exercise per Session: 0 min  Stress: No Stress Concern Present   Feeling of Stress : Not at all  Social Connections: Moderately Isolated   Frequency of Communication with Friends and Family: More than three times a week   Frequency of Social Gatherings with Friends and Family: More than three times a week   Attends Religious Services: More than 4 times per year   Active Member of [employer] or Organizations: No   Attends Archivist Meetings: Never   Marital  Status: Widowed  Human resources officer Violence: Not At Risk   Fear of Current or Ex-Partner: No   Emotionally Abused: No   Physically Abused: No   Sexually Abused: No    Outpatient Medications Prior to Visit  Medication Sig Dispense Refill   acetaminophen (TYLENOL) 500 MG tablet Take 2 tablets (1,000 mg total) by mouth 2 (two) times daily. 30 tablet 0   albuterol (VENTOLIN HFA) 108 (90 Base) MCG/ACT inhaler Inhale 2 puffs into the lungs every 6 (six) hours as needed. 18 g 0   amLODipine (NORVASC) 10 MG tablet TAKE ONE TABLET BY MOUTH ONE TIME DAILY 90 tablet 1   atorvastatin (LIPITOR) 80 MG tablet TAKE ONE TABLET BY MOUTH ONE TIME DAILY 90 tablet 1   Cyanocobalamin (VITAMIN B12 PO) Take 1 tablet by mouth daily.     furosemide (LASIX) 40 MG tablet TAKE ONE TABLET BY MOUTH ONE TIME DAILY 90 tablet 1   hydrALAZINE  (APRESOLINE) 25 MG tablet TAKE ONE TABLET BY MOUTH THREE TIMES A DAY 270 tablet 1   memantine (NAMENDA) 10 MG tablet Take 1 tablet (10 mg at night) for 2 weeks, then increase to 1 tablet (10 mg) twice a day (Patient taking differently: Take 10 mg by mouth 2 (two) times daily.) 60 tablet 11   metoprolol succinate (TOPROL-XL) 100 MG 24 hr tablet TAKE ONE TABLET BY MOUTH ONE TIME DAILY WITH FOOD OR IMMEDIATELY FOLLOWING A MEAL 90 tablet 1   potassium chloride SA (KLOR-CON) 20 MEQ tablet TAKE ONE-HALF TABLET BY MOUTH ONE TIME DAILY 45 tablet 1   benzonatate (TESSALON) 100 MG capsule Take 1 capsule (100 mg total) by mouth 3 (three) times daily as needed for cough. 30 capsule 0   cloNIDine (CATAPRES) 0.1 MG tablet Take 1 tablet (0.1 mg total) by mouth daily. 30 tablet 2   No facility-administered medications prior to visit.    Allergies  Allergen Reactions   Lisinopril     Renal insufficiency    ROS     Objective:    Physical Exam Constitutional:      General: She is not in acute distress.    Appearance: Normal appearance. She is well-developed.  HENT:     Head: Normocephalic and atraumatic.     Right Ear: External ear normal.     Left Ear: External ear normal.  Eyes:     General: No scleral icterus. Neck:     Thyroid: No thyromegaly.  Cardiovascular:     Rate and Rhythm: Normal rate and regular rhythm.     Heart sounds: Normal heart sounds. No murmur heard. Pulmonary:     Effort: Pulmonary effort is normal. No respiratory distress.     Breath sounds: Normal breath sounds. No wheezing.  Musculoskeletal:     Cervical back: Neck supple.     Comments: No knee swelling noted  Skin:    General: Skin is warm and dry.  Neurological:     Mental Status: She is alert and oriented to person, place, and time.  Psychiatric:        Mood and Affect: Mood normal.        Behavior: Behavior normal.        Thought Content: Thought content normal.        Judgment: Judgment normal.    BP  (!) 142/55    Pulse (!) 53    Temp 98.6 F (37 C) (Oral)    Resp 16    Wt 155 lb (70.3 kg)  SpO2 100%    BMI 22.89 kg/m  Wt Readings from Last 3 Encounters:  01/05/22 155 lb (70.3 kg)  12/22/21 157 lb (71.2 kg)  12/07/21 155 lb (70.3 kg)       Assessment & Plan:   Problem List Items Addressed This Visit       Unprioritized   Right knee pain    Offered referral to ortho- she declines. Will continue prn tylenol.       HTN (hypertension)    BP is improving.  Continue current meds/doses.   BP Readings from Last 3 Encounters:  01/05/22 (!) 142/55  12/22/21 (!) 180/54  12/02/21 123/65         I have discontinued Chrishonda L. Chapa's benzonatate. I am also having her maintain her acetaminophen, memantine, Cyanocobalamin (VITAMIN B12 PO), cloNIDine, amLODipine, atorvastatin, potassium chloride SA, furosemide, hydrALAZINE, metoprolol succinate, and albuterol.  No orders of the defined types were placed in this encounter.

## 2022-01-08 ENCOUNTER — Ambulatory Visit: Payer: Medicare HMO | Admitting: Family

## 2022-01-15 ENCOUNTER — Other Ambulatory Visit: Payer: Self-pay | Admitting: Family

## 2022-01-31 ENCOUNTER — Other Ambulatory Visit: Payer: Self-pay | Admitting: Family

## 2022-02-01 ENCOUNTER — Encounter: Payer: Self-pay | Admitting: Family

## 2022-02-05 ENCOUNTER — Other Ambulatory Visit: Payer: Self-pay | Admitting: Family

## 2022-02-10 ENCOUNTER — Other Ambulatory Visit: Payer: Self-pay | Admitting: Family

## 2022-02-14 ENCOUNTER — Other Ambulatory Visit: Payer: Self-pay | Admitting: Family

## 2022-03-30 ENCOUNTER — Ambulatory Visit (INDEPENDENT_AMBULATORY_CARE_PROVIDER_SITE_OTHER): Payer: Medicare Other | Admitting: Family

## 2022-03-30 VITALS — BP 137/67 | HR 56 | Temp 98.4°F | Resp 16 | Wt 156.0 lb

## 2022-03-30 DIAGNOSIS — F03B0 Unspecified dementia, moderate, without behavioral disturbance, psychotic disturbance, mood disturbance, and anxiety: Secondary | ICD-10-CM

## 2022-03-30 DIAGNOSIS — I10 Essential (primary) hypertension: Secondary | ICD-10-CM

## 2022-03-30 DIAGNOSIS — M546 Pain in thoracic spine: Secondary | ICD-10-CM | POA: Diagnosis not present

## 2022-03-30 DIAGNOSIS — Z23 Encounter for immunization: Secondary | ICD-10-CM

## 2022-03-30 DIAGNOSIS — R7303 Prediabetes: Secondary | ICD-10-CM | POA: Diagnosis not present

## 2022-03-30 NOTE — Patient Instructions (Signed)
Please complete lab work prior to leaving.  ?Complete x-ray on the first floor of your back.  ?You may use tylenol as needed for back pain.  ?

## 2022-03-30 NOTE — Assessment & Plan Note (Signed)
Stable o nnamenda.  ?

## 2022-03-30 NOTE — Progress Notes (Addendum)
? ?Subjective:  ? ?By signing my name below, I, Carylon Perches, attest that this documentation has been prepared under the direction and in the presence of [name] NP, 03/30/2022 ? ? Patient ID: Sherry Perez, female    DOB: 1941/10/15, 81 y.o.   MRN: [id number] ? ?Chief Complaint  ?Patient presents with  ? Hypertension  ?  Here for 3 months follow up  ? Back Pain  ?  Here for follow up  ? ? ?HPI ? ?Patient is in today for an office visit. ? ?Back Pain - She complains of thoracic back pain that begun about a month ago. She states that it hurts when she moves.  ? ?Blood Pressure - As of today's visit, her blood pressure is good. She is taking her blood pressure medications as prescribed. ?BP Readings from Last 3 Encounters:  ?03/30/22 137/67  ?01/05/22 (!) 142/55  ?12/22/21 (!) 180/54  ? ?Memory - She reports no changes to her memory. She is currently taking 10 MG of Namenda. ? ?Diet - She has been maintaining a consistent diet.  ? ?Vaccine - She is due for the pneumonia vaccine. She has not received the COVID - 19 bivalent vaccine. She also has not received the second dose of the [place] vaccine.  ? ?Health Maintenance Due  ?Topic Date Due  ? HEMOGLOBIN A1C  04/10/2021  ? COVID-19 Vaccine (4 - Booster for Pfizer series) 08/05/2021  ? Zoster Vaccines- Shingrix (2 of 2) 08/05/2021  ? ? ?Past Medical History:  ?Diagnosis Date  ? Diabetes mellitus without complication ([place])   ? HTN (hypertension)   ? Hyperlipidemia   ? ? ?Past Surgical History:  ?Procedure Laterality Date  ? ABDOMINAL HYSTERECTOMY    ? cataract surgery Left 02/04/2021  ? ? ?Family History  ?Problem Relation Age of Onset  ? Diabetes Mother   ? Diabetes Brother   ? ? ?Social History  ? ?Socioeconomic History  ? Marital status: Widowed  ?  Spouse name: Not on file  ? Number of children: Not on file  ? Years of education: Not on file  ? Highest education level: Not on file  ?Occupational History  ? Not on file  ?Tobacco Use  ? Smoking status:  Never  ? Smokeless tobacco: Never  ?Vaping Use  ? Vaping Use: Never used  ?Substance and Sexual Activity  ? Alcohol use: No  ?  Alcohol/week: 0.0 standard drinks  ? Drug use: Never  ? Sexual activity: Not on file  ?Other Topics Concern  ? Not on file  ?Social History Narrative  ? 4 children (3 daughters 1 son)- all local  ? 7 grandchildren   ? 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 Widowed (husband passed 2016)  ? Lives with [name]  ? Enjoys cross word puzzle, adult coloring books, reads bible  ? Right handed   ? ?Social Determinants of Health  ? ?Financial Resource Strain: Low Risk   ? Difficulty of Paying Living Expenses: Not hard at all  ?Food Insecurity: No Food Insecurity  ? Worried About Charity fundraiser in the Last Year: Never true  ? Ran Out of Food in the Last Year: Never true  ?Transportation Needs: No Transportation Needs  ? Lack of Transportation (Medical): No  ? Lack of Transportation (Non-Medical): No  ?Physical Activity: Inactive  ? Days of Exercise per Week: 0 days  ? Minutes of Exercise per Session: 0 min  ?Stress: No Stress Concern Present  ?  Feeling of Stress : Not at all  ?Social Connections: Moderately Isolated  ? Frequency of Communication with Friends and Family: More than three times a week  ? Frequency of Social Gatherings with Friends and Family: More than three times a week  ? Attends Religious Services: More than 4 times per year  ? Active Member of Clubs or Organizations: No  ? Attends Archivist Meetings: Never  ? Marital Status: Widowed  ?Intimate Partner Violence: Not At Risk  ? Fear of Current or Ex-Partner: No  ? Emotionally Abused: No  ? Physically Abused: No  ? Sexually Abused: No  ? ? ?Outpatient Medications Prior to Visit  ?Medication Sig Dispense Refill  ? acetaminophen (TYLENOL) 500 MG tablet Take 2 tablets (1,000 mg total) by mouth 2 (two) times daily. 30 tablet 0  ? albuterol (VENTOLIN HFA) 108 (90 Base) MCG/ACT inhaler Inhale 2 puffs into the lungs  every 6 (six) hours as needed. 18 g 0  ? amLODipine (NORVASC) 10 MG tablet TAKE ONE TABLET BY MOUTH ONE TIME DAILY 90 tablet 1  ? atorvastatin (LIPITOR) 80 MG tablet TAKE ONE TABLET BY MOUTH ONE TIME DAILY 90 tablet 1  ? cloNIDine (CATAPRES) 0.1 MG tablet Take 1 tablet (0.1 mg total) by mouth daily. 30 tablet 2  ? Cyanocobalamin (VITAMIN B12 PO) Take 1 tablet by mouth daily.    ? furosemide (LASIX) 40 MG tablet TAKE ONE TABLET BY MOUTH ONE TIME DAILY 90 tablet 1  ? hydrALAZINE (APRESOLINE) 25 MG tablet TAKE ONE TABLET BY MOUTH THREE TIMES A DAY 270 tablet 1  ? memantine (NAMENDA) 10 MG tablet Take 1 tablet (10 mg at night) for 2 weeks, then increase to 1 tablet (10 mg) twice a day (Patient taking differently: Take 10 mg by mouth 2 (two) times daily.) 60 tablet 11  ? metoprolol succinate (TOPROL-XL) 100 MG 24 hr tablet TAKE ONE TABLET BY MOUTH ONE TIME DAILY WITH FOOD OR IMMEDIATELY FOLLOWING A MEAL 90 tablet 1  ? potassium chloride SA (KLOR-CON M) 20 MEQ tablet TAKE ONE-HALF TABLET BY MOUTH ONE TIME DAILY 45 tablet 1  ? ?No facility-administered medications prior to visit.  ? ? ?Allergies  ?Allergen Reactions  ? Lisinopril   ?  Renal insufficiency  ? ? ?Review of Systems  ?Musculoskeletal:  Positive for back pain (Thoracic Region).  ? ?   ?Objective:  ?  ?Physical Exam ?Constitutional:   ?   General: She is not in acute distress. ?   Appearance: Normal appearance. She is not ill-appearing.  ?HENT:  ?   Head: Normocephalic and atraumatic.  ?   Right Ear: External ear normal.  ?   Left Ear: External ear normal.  ?Eyes:  ?   Extraocular Movements: Extraocular movements intact.  ?   Pupils: Pupils are equal, round, and reactive to light.  ?Cardiovascular:  ?   Rate and Rhythm: Normal rate and regular rhythm.  ?   Heart sounds: Normal heart sounds. No murmur heard. ?  No gallop.  ?Pulmonary:  ?   Effort: Pulmonary effort is normal. No respiratory distress.  ?   Breath sounds: Normal breath sounds. No wheezing or rales.   ?Skin: ?   General: Skin is warm and dry.  ?Neurological:  ?   Mental Status: She is alert and oriented to person, place, and time.  ?Psychiatric:     ?   Mood and Affect: Mood normal.     ?   Behavior: Behavior normal.     ?  Judgment: Judgment normal.  ? ? ?BP 137/67 (BP Location: Right Arm, Patient Position: Sitting, Cuff Size: Small)   Pulse (!) 56   Temp 98.4 ?F (36.9 ?C) (Oral)   Resp 16   Wt 156 lb (70.8 kg)   SpO2 100%   BMI 23.04 kg/m?  ?Wt Readings from Last 3 Encounters:  ?03/30/22 156 lb (70.8 kg)  ?01/05/22 155 lb (70.3 kg)  ?12/22/21 157 lb (71.2 kg)  ? ? ?   ?Assessment & Plan:  ? ?Problem List Items Addressed This Visit   ? ?  ? Unprioritized  ? HTN (hypertension)  ?  BP Readings from Last 3 Encounters:  ?03/30/22 137/67  ?01/05/22 (!) 142/55  ?12/22/21 (!) 180/54  ?At goal. Continue hydralazine, metoprolol, clonicine, toprol xl.  ? ?  ?  ? Dementia ([place])  ?  Stable o nnamenda.  ?  ?  ? Borderline diabetes  ?  Lab Results  ?Component Value Date  ? HGBA1C 5.5 10/10/2020  ? HGBA1C 5.8 06/18/2020  ? HGBA1C 5.5 10/09/2019  ? ?Lab Results  ?Component Value Date  ? MICROALBUR 0.7 06/07/2016  ? [place] 91 08/18/2021  ? CREATININE 2.13 (H) 12/02/2021  ?Reports healthy diet. Will recheck A1C.   ?  ?  ? Relevant Orders  ? Hemoglobin A1c  ? Basic metabolic panel  ? RESOLVED: Acute midline thoracic back pain - Primary  ?  New. Will obtain x-ray.  ?  ?  ? Relevant Orders  ? DG Thoracic Spine 2 View  ? ?Other Visit Diagnoses   ? ? Need for pneumococcal vaccine      ? Relevant Orders  ? Pneumococcal polysaccharide vaccine 23-valent greater than or equal to 2yo subcutaneous/IM (Completed)  ? ?  ? ? ? ? ?No orders of the defined types were placed in this encounter. ? ? ?I, Nance Pear, NP, personally preformed the services described in this documentation.  All medical record entries made by the scribe were at my direction and in my presence.  I have reviewed the chart and discharge instructions (if  applicable) and agree that the record reflects my personal performance and is accurate and complete. 03/30/2022 ? ? ?I,Amber Collins,acting as a Education administrator for [employer], NP.,have documented all relevant

## 2022-03-30 NOTE — Assessment & Plan Note (Signed)
BP Readings from Last 3 Encounters:  ?03/30/22 137/67  ?01/05/22 (!) 142/55  ?12/22/21 (!) 180/54  ? ?At goal. Continue hydralazine, metoprolol, clonicine, toprol xl.  ? ?

## 2022-03-30 NOTE — Assessment & Plan Note (Signed)
New. Will obtain x-ray.  ?

## 2022-03-30 NOTE — Assessment & Plan Note (Signed)
Lab Results  ?Component Value Date  ? HGBA1C 5.5 10/10/2020  ? HGBA1C 5.8 06/18/2020  ? HGBA1C 5.5 10/09/2019  ? ?Lab Results  ?Component Value Date  ? MICROALBUR 0.7 06/07/2016  ? [place] 91 08/18/2021  ? CREATININE 2.13 (H) 12/02/2021  ? ?Reports healthy diet. Will recheck A1C.   ?

## 2022-03-31 LAB — HEMOGLOBIN A1C: Hgb A1c MFr Bld: 6 % (ref 4.6–6.5)

## 2022-03-31 LAB — BASIC METABOLIC PANEL
BUN: 38 mg/dL — ABNORMAL HIGH (ref 6–23)
CO2: 25 mEq/L (ref 19–32)
Calcium: 9 mg/dL (ref 8.4–10.5)
Chloride: 101 mEq/L (ref 96–112)
Creatinine, Ser: 2.08 mg/dL — ABNORMAL HIGH (ref 0.40–1.20)
GFR: 21.71 mL/min — ABNORMAL LOW (ref >60.00)
Glucose, Bld: 74 mg/dL (ref 70–99)
Potassium: 4.4 mEq/L (ref 3.5–5.1)
Sodium: 138 mEq/L (ref 135–145)

## 2022-04-06 ENCOUNTER — Ambulatory Visit: Payer: Medicare Other | Admitting: Family

## 2022-04-15 ENCOUNTER — Other Ambulatory Visit: Payer: Self-pay | Admitting: Family

## 2022-04-25 ENCOUNTER — Other Ambulatory Visit: Payer: Self-pay | Admitting: Family

## 2022-05-02 ENCOUNTER — Other Ambulatory Visit: Payer: Self-pay | Admitting: Family

## 2022-05-31 ENCOUNTER — Other Ambulatory Visit: Payer: Self-pay | Admitting: Family

## 2022-06-29 ENCOUNTER — Ambulatory Visit (INDEPENDENT_AMBULATORY_CARE_PROVIDER_SITE_OTHER): Payer: Medicare Other | Admitting: Family

## 2022-06-29 VITALS — BP 134/68 | HR 81 | Temp 98.0°F | Resp 18 | Ht 69.0 in | Wt 156.8 lb

## 2022-06-29 DIAGNOSIS — N1832 Chronic kidney disease, stage 3b: Secondary | ICD-10-CM | POA: Insufficient documentation

## 2022-06-29 DIAGNOSIS — N289 Disorder of kidney and ureter, unspecified: Secondary | ICD-10-CM | POA: Diagnosis not present

## 2022-06-29 DIAGNOSIS — N184 Chronic kidney disease, stage 4 (severe): Secondary | ICD-10-CM | POA: Insufficient documentation

## 2022-06-29 DIAGNOSIS — R7303 Prediabetes: Secondary | ICD-10-CM | POA: Diagnosis not present

## 2022-06-29 DIAGNOSIS — R739 Hyperglycemia, unspecified: Secondary | ICD-10-CM

## 2022-06-29 DIAGNOSIS — F03B0 Unspecified dementia, moderate, without behavioral disturbance, psychotic disturbance, mood disturbance, and anxiety: Secondary | ICD-10-CM

## 2022-06-29 DIAGNOSIS — I10 Essential (primary) hypertension: Secondary | ICD-10-CM

## 2022-06-29 LAB — HEMOGLOBIN A1C: Hgb A1c MFr Bld: 5.7 % (ref 4.6–6.5)

## 2022-06-29 LAB — COMPREHENSIVE METABOLIC PANEL
ALT: 11 U/L (ref 0–35)
AST: 21 U/L (ref 0–37)
Albumin: 4.4 g/dL (ref 3.5–5.2)
Alkaline Phosphatase: 66 U/L (ref 39–117)
BUN: 24 mg/dL — ABNORMAL HIGH (ref 6–23)
CO2: 28 mEq/L (ref 19–32)
Calcium: 9.8 mg/dL (ref 8.4–10.5)
Chloride: 101 mEq/L (ref 96–112)
Creatinine, Ser: 1.57 mg/dL — ABNORMAL HIGH (ref 0.40–1.20)
GFR: 30.37 mL/min — ABNORMAL LOW (ref >60.00)
Glucose, Bld: 82 mg/dL (ref 70–99)
Potassium: 3.9 mEq/L (ref 3.5–5.1)
Sodium: 139 mEq/L (ref 135–145)
Total Bilirubin: 0.8 mg/dL (ref 0.2–1.2)
Total Protein: 8 g/dL (ref 6.0–8.3)

## 2022-06-29 NOTE — Assessment & Plan Note (Signed)
She has not seen nephrology in several years. Will arrange follow up.

## 2022-06-29 NOTE — Assessment & Plan Note (Signed)
Will repeat A1C today.  Wt Readings from Last 3 Encounters:  06/29/22 156 lb 12.8 oz (71.1 kg)  03/30/22 156 lb (70.8 kg)  01/05/22 155 lb (70.3 kg)   Weight is stable.

## 2022-06-29 NOTE — Assessment & Plan Note (Signed)
Stable on Namenda which is being prescribed by neurology.

## 2022-06-29 NOTE — Assessment & Plan Note (Signed)
BP Readings from Last 3 Encounters:  06/29/22 134/68  03/30/22 137/67  01/05/22 (!) 142/55   BP stable/at goal. Continue hydralazine, metoprolol, clonicine, toprol xl.

## 2022-06-29 NOTE — Progress Notes (Signed)
Subjective:   By signing my name below, I, Kellie Simmering, attest that this documentation has been prepared under the direction and in the presence of [name], NP 06/29/2022.   Patient ID: Sherry Perez, female    DOB: 1941/03/07, 81 y.o.   MRN: [id number]  Chief Complaint  Patient presents with   Hypertension   Follow-up    HPI Patient is in today for an office visit.  Blood Pressure- Her blood pressure is within normal ranges. BP Readings from Last 3 Encounters:  06/29/22 134/68  03/30/22 137/67  01/05/22 (!) 142/55   Back pain- She has managed her back pain well since her last visit.  Memory-Her memory has been stable since her last visit.  Kidneys- She has seen Dr. Rolan Lipa to examine her kidney function. She has not seen her in several years.   Past Medical History:  Diagnosis Date   Diabetes mellitus without complication (HCC)    HTN (hypertension)    Hyperlipidemia     Past Surgical History:  Procedure Laterality Date   ABDOMINAL HYSTERECTOMY     cataract surgery Left 02/04/2021    Family History  Problem Relation Age of Onset   Diabetes Mother    Diabetes Brother     Social History   Socioeconomic History   Marital status: Widowed    Spouse name: Not on file   Number of children: Not on file   Years of education: Not on file   Highest education level: Not on file  Occupational History   Not on file  Tobacco Use   Smoking status: Never   Smokeless tobacco: Never  Vaping Use   Vaping Use: Never used  Substance and Sexual Activity   Alcohol use: No    Alcohol/week: 0.0 standard drinks of alcohol   Drug use: Never   Sexual activity: Not on file  Other Topics Concern   Not on file  Social History Narrative   4 children (3 daughters 1 son)- all local   7 grandchildren    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)   Widowed (husband passed 2016)   Lives with [name]   Enjoys cross word puzzle, adult coloring books,  reads bible   Right handed    Social Determinants of Health   Financial Resource Strain: Low Risk  (06/30/2021)   Overall Financial Resource Strain (CARDIA)    Difficulty of Paying Living Expenses: Not hard at all  Food Insecurity: No [place] (06/30/2021)   Hunger Vital Sign    Worried About Running Out of Food in the Last Year: Never true    [place] in the Last Year: Never true  Transportation Needs: No Transportation Needs (06/30/2021)   PRAPARE - Hydrologist (Medical): No    Lack of Transportation (Non-Medical): No  Physical Activity: Inactive (06/30/2021)   Exercise Vital Sign    Days of Exercise per Week: 0 days    Minutes of Exercise per Session: 0 min  Stress: No Stress Concern Present (06/30/2021)   [place]    Feeling of Stress : Not at all  Social Connections: Moderately Isolated (06/30/2021)   Social Connection and Isolation Panel [NHANES]    Frequency of Communication with Friends and Family: More than three times a week    Frequency of Social Gatherings with Friends and Family: More than three times a week    Attends Religious  Services: More than 4 times per year    Active Member of Clubs or Organizations: No    Attends Archivist Meetings: Never    Marital Status: Widowed  Intimate Partner Violence: Not At Risk (06/30/2021)   Humiliation, Afraid, Rape, and Kick questionnaire    Fear of Current or Ex-Partner: No    Emotionally Abused: No    Physically Abused: No    Sexually Abused: No    Outpatient Medications Prior to Visit  Medication Sig Dispense Refill   acetaminophen (TYLENOL) 500 MG tablet Take 2 tablets (1,000 mg total) by mouth 2 (two) times daily. 30 tablet 0   amLODipine (NORVASC) 10 MG tablet TAKE ONE TABLET BY MOUTH ONE TIME DAILY 90 tablet 1   atorvastatin (LIPITOR) 80 MG tablet TAKE ONE TABLET BY MOUTH ONE TIME DAILY 90 tablet 1    Cyanocobalamin (VITAMIN B12 PO) Take 1 tablet by mouth daily.     furosemide (LASIX) 40 MG tablet TAKE ONE TABLET BY MOUTH ONE TIME DAILY 90 tablet 1   hydrALAZINE (APRESOLINE) 25 MG tablet TAKE ONE TABLET BY MOUTH THREE TIMES A DAY 270 tablet 1   memantine (NAMENDA) 10 MG tablet Take 1 tablet (10 mg at night) for 2 weeks, then increase to 1 tablet (10 mg) twice a day (Patient taking differently: Take 10 mg by mouth 2 (two) times daily.) 60 tablet 11   metoprolol succinate (TOPROL-XL) 100 MG 24 hr tablet TAKE ONE TABLET BY MOUTH ONE TIME DAILY WITH FOOD OR IMMEDIATELY FOLLOWING A MEAL 90 tablet 1   potassium chloride SA (KLOR-CON M) 20 MEQ tablet TAKE ONE-HALF TABLET BY MOUTH ONE TIME DAILY 45 tablet 1   cloNIDine (CATAPRES) 0.1 MG tablet Take 1 tablet (0.1 mg total) by mouth daily. 30 tablet 2   albuterol (VENTOLIN HFA) 108 (90 Base) MCG/ACT inhaler Inhale 2 puffs into the lungs every 6 (six) hours as needed. 18 g 0   No facility-administered medications prior to visit.    Allergies  Allergen Reactions   Lisinopril     Renal insufficiency    Review of Systems  Musculoskeletal:        (+) back pain       Objective:    Physical Exam Constitutional:      Appearance: Normal appearance. She is not ill-appearing.  HENT:     Head: Normocephalic and atraumatic.     Right Ear: External ear normal.     Left Ear: External ear normal.  Eyes:     Extraocular Movements: Extraocular movements intact.     Pupils: Pupils are equal, round, and reactive to light.  Cardiovascular:     Rate and Rhythm: Normal rate and regular rhythm.     Pulses: Normal pulses.     Heart sounds: Normal heart sounds. No murmur heard.    No gallop.  Pulmonary:     Effort: Pulmonary effort is normal. No respiratory distress.     Breath sounds: Normal breath sounds. No wheezing.  Skin:    General: Skin is warm and dry.  Neurological:     Mental Status: She is alert. Mental status is at baseline.  Psychiatric:         Judgment: Judgment normal.     BP 134/68 (BP Location: Left Arm, Patient Position: Sitting, Cuff Size: Normal)   Pulse 81   Temp 98 F (36.7 C) (Oral)   Resp 18   Ht _0  (1.753 m)   Wt 156 lb 12.8  oz (71.1 kg)   SpO2 99%   BMI 23.16 kg/m  Wt Readings from Last 3 Encounters:  06/29/22 156 lb 12.8 oz (71.1 kg)  03/30/22 156 lb (70.8 kg)  01/05/22 155 lb (70.3 kg)     Assessment & Plan:   Problem List Items Addressed This Visit       Unprioritized   HTN (hypertension)    BP Readings from Last 3 Encounters:  06/29/22 134/68  03/30/22 137/67  01/05/22 (!) 142/55  BP stable/at goal. Continue hydralazine, metoprolol, clonicine, toprol xl.       Dementia ([place])    Stable on Namenda which is being prescribed by neurology.       CKD (chronic kidney disease) stage 4, GFR 15-29 ml/min (HCC) - Primary    She has not seen nephrology in several years. Will arrange follow up.       Relevant Orders   Protein Electrophoresis, (serum)   Comp Met (CMET)   Ambulatory referral to Nephrology   Borderline diabetes    Will repeat A1C today.  Wt Readings from Last 3 Encounters:  06/29/22 156 lb 12.8 oz (71.1 kg)  03/30/22 156 lb (70.8 kg)  01/05/22 155 lb (70.3 kg)  Weight is stable.       Other Visit Diagnoses     Hyperglycemia       Relevant Orders   Hemoglobin A1c        No orders of the defined types were placed in this encounter.   I, Nance Pear, NP, personally preformed the services described in this documentation.  All medical record entries made by the scribe were at my direction and in my presence.  I have reviewed the chart and discharge instructions (if applicable) and agree that the record reflects my personal performance and is accurate and complete. 06/29/2022.  I,Mohammed Iqbal,acting as a Education administrator for [employer], NP.,have documented all relevant documentation on the behalf of Nance Pear, NP,as directed by  Nance Pear, NP while in the presence of Nance Pear, NP.   Nance Pear, NP

## 2022-07-02 ENCOUNTER — Telehealth: Payer: Self-pay | Admitting: *Deleted

## 2022-07-02 NOTE — Telephone Encounter (Signed)
Spoke with Tommye Standard at [facility] and added immunofixation.  Per [name], NP:  Please see SPEP result. Is it possible to ask the lab to run the immunofixation on this sample please? tks

## 2022-07-05 ENCOUNTER — Other Ambulatory Visit: Payer: Self-pay | Admitting: Physician Assistant

## 2022-07-05 ENCOUNTER — Ambulatory Visit (INDEPENDENT_AMBULATORY_CARE_PROVIDER_SITE_OTHER): Payer: Medicare Other

## 2022-07-05 DIAGNOSIS — Z0000 Encounter for general adult medical examination without abnormal findings: Secondary | ICD-10-CM | POA: Diagnosis not present

## 2022-07-05 DIAGNOSIS — Z1231 Encounter for screening mammogram for malignant neoplasm of breast: Secondary | ICD-10-CM | POA: Diagnosis not present

## 2022-07-05 DIAGNOSIS — Z780 Asymptomatic menopausal state: Secondary | ICD-10-CM

## 2022-07-05 NOTE — Patient Instructions (Signed)
Ms. Riederer , Thank you for taking time to come for your Medicare Wellness Visit. I appreciate your ongoing commitment to your health goals. Please review the following plan we discussed and let me know if I can assist you in the future.   Screening recommendations/referrals: Colonoscopy: no longer needed Mammogram: ordered 07/05/22 Bone Density: ordered 07/05/22 Recommended yearly ophthalmology/optometry visit for glaucoma screening and checkup Recommended yearly dental visit for hygiene and checkup  Vaccinations: Influenza vaccine: up to date Pneumococcal vaccine: up to date Tdap vaccine: up to date Shingles vaccine: up to date   Covid-19:Due-May obtain vaccine at your local pharmacy.   Advanced directives: yes, not on file  Conditions/risks identified: see problem list  Next appointment: Follow up in one year for your annual wellness visit 07/07/23   Preventive Care 65 Years and Older, Female Preventive care refers to lifestyle choices and visits with your health care provider that can promote health and wellness. What does preventive care include? A yearly physical exam. This is also called an annual well check. Dental exams once or twice a year. Routine eye exams. Ask your health care provider how often you should have your eyes checked. Personal lifestyle choices, including: Daily care of your teeth and gums. Regular physical activity. Eating a healthy diet. Avoiding tobacco and drug use. Limiting alcohol use. Practicing safe sex. Taking low-dose aspirin every day. Taking vitamin and mineral supplements as recommended by your health care provider. What happens during an annual well check? The services and screenings done by your health care provider during your annual well check will depend on your age, overall health, lifestyle risk factors, and family history of disease. Counseling  Your health care provider may ask you questions about your: Alcohol use. Tobacco  use. Drug use. Emotional well-being. Home and relationship well-being. Sexual activity. Eating habits. History of falls. Memory and ability to understand (cognition). Work and work Statistician. Reproductive health. Screening  You may have the following tests or measurements: Height, weight, and BMI. Blood pressure. Lipid and cholesterol levels. These may be checked every 5 years, or more frequently if you are over 1 years old. Skin check. Lung cancer screening. You may have this screening every year starting at age 65 if you have a 30-pack-year history of smoking and currently smoke or have quit within the past 15 years. Fecal occult blood test (FOBT) of the stool. You may have this test every year starting at age 47. Flexible sigmoidoscopy or colonoscopy. You may have a sigmoidoscopy every 5 years or a colonoscopy every 10 years starting at age 36. Hepatitis C blood test. Hepatitis B blood test. Sexually transmitted disease (STD) testing. Diabetes screening. This is done by checking your blood sugar (glucose) after you have not eaten for a while (fasting). You may have this done every 1-3 years. Bone density scan. This is done to screen for osteoporosis. You may have this done starting at age 22. Mammogram. This may be done every 1-2 years. Talk to your health care provider about how often you should have regular mammograms. Talk with your health care provider about your test results, treatment options, and if necessary, the need for more tests. Vaccines  Your health care provider may recommend certain vaccines, such as: Influenza vaccine. This is recommended every year. Tetanus, diphtheria, and acellular pertussis (Tdap, Td) vaccine. You may need a Td booster every 10 years. Zoster vaccine. You may need this after age 104. Pneumococcal 13-valent conjugate (PCV13) vaccine. One dose is recommended after age  65. Pneumococcal polysaccharide (PPSV23) vaccine. One dose is recommended  after age 44. Talk to your health care provider about which screenings and vaccines you need and how often you need them. This information is not intended to replace advice given to you by your health care provider. Make sure you discuss any questions you have with your health care provider. Document Released: 01/02/2016 Document Revised: 08/25/2016 Document Reviewed: 10/07/2015 Elsevier Interactive Patient Education  2017 [employer] Prevention in the Home Falls can cause injuries. They can happen to people of all ages. There are many things you can do to make your home safe and to help prevent falls. What can I do on the outside of my home? Regularly fix the edges of walkways and driveways and fix any cracks. Remove anything that might make you trip as you walk through a door, such as a raised step or threshold. Trim any bushes or trees on the path to your home. Use bright outdoor lighting. Clear any walking paths of anything that might make someone trip, such as rocks or tools. Regularly check to see if handrails are loose or broken. Make sure that both sides of any steps have handrails. Any raised decks and porches should have guardrails on the edges. Have any leaves, snow, or ice cleared regularly. Use sand or salt on walking paths during winter. Clean up any spills in your garage right away. This includes oil or grease spills. What can I do in the bathroom? Use night lights. Install grab bars by the toilet and in the tub and shower. Do not use towel bars as grab bars. Use non-skid mats or decals in the tub or shower. If you need to sit down in the shower, use a plastic, non-slip stool. Keep the floor dry. Clean up any water that spills on the floor as soon as it happens. Remove soap buildup in the tub or shower regularly. Attach bath mats securely with double-sided non-slip rug tape. Do not have throw rugs and other things on the floor that can make you trip. What can I do  in the bedroom? Use night lights. Make sure that you have a light by your bed that is easy to reach. Do not use any sheets or blankets that are too big for your bed. They should not hang down onto the floor. Have a firm chair that has side arms. You can use this for support while you get dressed. Do not have throw rugs and other things on the floor that can make you trip. What can I do in the kitchen? Clean up any spills right away. Avoid walking on wet floors. Keep items that you use a lot in easy-to-reach places. If you need to reach something above you, use a strong step stool that has a grab bar. Keep electrical cords out of the way. Do not use floor polish or wax that makes floors slippery. If you must use wax, use non-skid floor wax. Do not have throw rugs and other things on the floor that can make you trip. What can I do with my stairs? Do not leave any items on the stairs. Make sure that there are handrails on both sides of the stairs and use them. Fix handrails that are broken or loose. Make sure that handrails are as long as the stairways. Check any carpeting to make sure that it is firmly attached to the stairs. Fix any carpet that is loose or worn. Avoid having throw rugs at  the top or bottom of the stairs. If you do have throw rugs, attach them to the floor with carpet tape. Make sure that you have a light switch at the top of the stairs and the bottom of the stairs. If you do not have them, ask someone to add them for you. What else can I do to help prevent falls? Wear shoes that: Do not have high heels. Have rubber bottoms. Are comfortable and fit you well. Are closed at the toe. Do not wear sandals. If you use a stepladder: Make sure that it is fully opened. Do not climb a closed stepladder. Make sure that both sides of the stepladder are locked into place. Ask someone to hold it for you, if possible. Clearly mark and make sure that you can see: Any grab bars or  handrails. First and last steps. Where the edge of each step is. Use tools that help you move around (mobility aids) if they are needed. These include: Canes. Walkers. Scooters. Crutches. Turn on the lights when you go into a dark area. Replace any light bulbs as soon as they burn out. Set up your furniture so you have a clear path. Avoid moving your furniture around. If any of your floors are uneven, fix them. If there are any pets around you, be aware of where they are. Review your medicines with your doctor. Some medicines can make you feel dizzy. This can increase your chance of falling. Ask your doctor what other things that you can do to help prevent falls. This information is not intended to replace advice given to you by your health care provider. Make sure you discuss any questions you have with your health care provider. Document Released: 10/02/2009 Document Revised: 05/13/2016 Document Reviewed: 01/10/2015 Elsevier Interactive Patient Education  2017 [employer].

## 2022-07-05 NOTE — Progress Notes (Signed)
Subjective:   Sherry Perez is a 81 y.o. female who presents for Medicare Annual (Subsequent) preventive examination.  I connected with  Sherry Perez on 07/05/22 by a audio enabled telemedicine application and verified that I am speaking with the correct person using two identifiers.  Patient Location: Home  Provider Location: Office/Clinic  I discussed the limitations of evaluation and management by telemedicine. The patient expressed understanding and agreed to proceed.   Review of Systems     Cardiac Risk Factors include: advanced age (>32men, >61 women);hypertension;dyslipidemia     Objective:    There were no vitals filed for this visit. There is no height or weight on file to calculate BMI.     07/05/2022    9:42 AM 12/07/2021    9:11 AM 09/13/2021    1:55 PM 07/30/2021    4:00 PM 07/30/2021   10:01 AM 07/29/2021   10:17 AM 06/30/2021    9:55 AM  Advanced Directives  Does Patient Have a Medical Advance Directive? Yes Yes No  Yes Yes No  Type of Paramedic of [place];Living will [place];Living will  [place];Living will  [place];Living will   Does patient want to make changes to medical advance directive?    No - Guardian declined  No - Patient declined Yes (MAU/Ambulatory/Procedural Areas - Information given)  Copy of [place] in Chart? No - copy requested   No - copy requested  No - copy requested     Current Medications (verified) Outpatient Encounter Medications as of 07/05/2022  Medication Sig   acetaminophen (TYLENOL) 500 MG tablet Take 2 tablets (1,000 mg total) by mouth 2 (two) times daily.   amLODipine (NORVASC) 10 MG tablet TAKE ONE TABLET BY MOUTH ONE TIME DAILY   atorvastatin (LIPITOR) 80 MG tablet TAKE ONE TABLET BY MOUTH ONE TIME DAILY   Cyanocobalamin (VITAMIN B12 PO) Take 1 tablet by mouth daily.   furosemide (LASIX) 40 MG tablet TAKE ONE TABLET  BY MOUTH ONE TIME DAILY   hydrALAZINE (APRESOLINE) 25 MG tablet TAKE ONE TABLET BY MOUTH THREE TIMES A DAY   memantine (NAMENDA) 10 MG tablet Take 1 tablet (10 mg at night) for 2 weeks, then increase to 1 tablet (10 mg) twice a day (Patient taking differently: Take 10 mg by mouth 2 (two) times daily.)   metoprolol succinate (TOPROL-XL) 100 MG 24 hr tablet TAKE ONE TABLET BY MOUTH ONE TIME DAILY WITH FOOD OR IMMEDIATELY FOLLOWING A MEAL   potassium chloride SA (KLOR-CON M) 20 MEQ tablet TAKE ONE-HALF TABLET BY MOUTH ONE TIME DAILY   cloNIDine (CATAPRES) 0.1 MG tablet Take 1 tablet (0.1 mg total) by mouth daily.   No facility-administered encounter medications on file as of 07/05/2022.    Allergies (verified) Lisinopril   History: Past Medical History:  Diagnosis Date   Diabetes mellitus without complication (HCC)    HTN (hypertension)    Hyperlipidemia    Past Surgical History:  Procedure Laterality Date   ABDOMINAL HYSTERECTOMY     cataract surgery Left 02/04/2021   Family History  Problem Relation Age of Onset   Diabetes Mother    Diabetes Brother    Social History   Socioeconomic History   Marital status: Widowed    Spouse name: Not on file   Number of children: Not on file   Years of education: Not on file   Highest education level: Not on file  Occupational History   Not on file  Tobacco Use   Smoking status: Never   Smokeless tobacco: Never  Vaping Use   Vaping Use: Never used  Substance and Sexual Activity   Alcohol use: No    Alcohol/week: 0.0 standard drinks of alcohol   Drug use: Never   Sexual activity: Not on file  Other Topics Concern   Not on file  Social History Narrative   4 children (3 daughters 1 son)- all local   7 grandchildren    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)   Widowed (husband passed 2016)   Lives with Daughter Sherry Perez   Enjoys cross word puzzle, adult coloring books, reads bible   Right handed    Social Determinants of Health    Financial Resource Strain: Low Risk  (06/30/2021)   Overall Financial Resource Strain (CARDIA)    Difficulty of Paying Living Expenses: Not hard at all  Food Insecurity: No Food Insecurity (06/30/2021)   Hunger Vital Sign    Worried About Running Out of Food in the Last Year: Never true    [place] in the Last Year: Never true  Transportation Needs: No Transportation Needs (06/30/2021)   PRAPARE - Hydrologist (Medical): No    Lack of Transportation (Non-Medical): No  Physical Activity: Inactive (06/30/2021)   Exercise Vital Sign    Days of Exercise per Week: 0 days    Minutes of Exercise per Session: 0 min  Stress: No Stress Concern Present (06/30/2021)   [place]    Feeling of Stress : Not at all  Social Connections: Moderately Isolated (06/30/2021)   Social Connection and Isolation Panel [NHANES]    Frequency of Communication with Friends and Family: More than three times a week    Frequency of Social Gatherings with Friends and Family: More than three times a week    Attends Religious Services: More than 4 times per year    Active Member of [employer] or Organizations: No    Attends Archivist Meetings: Never    Marital Status: Widowed    Tobacco Counseling Counseling given: Not Answered   Clinical Intake:  Pre-visit preparation completed: Yes  Pain : No/denies pain     BMI - recorded: 23.16 Nutritional Status: BMI of 19-24  Normal Nutritional Risks: None Diabetes: No  How often do you need to have someone help you when you read instructions, pamphlets, or other written materials from your doctor or pharmacy?: 1 - Never  Diabetic?no  Interpreter Needed?: No  Information entered by :: Sherry Perez   Activities of Daily Living    07/05/2022    9:44 AM 07/30/2021    5:10 PM  In your present state of health, do you have any difficulty performing the  following activities:  Hearing? 0   Vision? 0   Difficulty concentrating or making decisions? 0   Walking or climbing stairs? 0   Dressing or bathing? 0   Doing errands, shopping? 1 1  Preparing Food and eating ? N   Using the Toilet? N   In the past six months, have you accidently leaked urine? N   Do you have problems with loss of bowel control? N   Managing your Medications? Y   Managing your Finances? Y   Housekeeping or managing your Housekeeping? Y     Patient Care Team: [name], NP as PCP - General (Internal  Medicine) Corliss Parish, MD as Consulting Physician (Nephrology) Elease Hashimoto (Neurology)  Indicate any recent Medical Services you may have received from other than [facility] providers in the past year (date may be approximate).     Assessment:   This is a routine wellness examination for Omya.  Hearing/Vision screen No results found.  Dietary issues and exercise activities discussed: Current Exercise Habits: Home exercise routine, Type of exercise: walking, Time (Minutes): 30, Frequency (Times/Week): 7, Weekly Exercise (Minutes/Week): 210, Intensity: Mild   Goals Addressed             This Visit's Progress    Patient Stated   On track    Drink more water     stay healthy   On track      Depression Screen    07/05/2022    9:42 AM 06/30/2021    9:56 AM 02/10/2021   10:27 AM 11/06/2019    3:21 PM 02/24/2018    3:24 PM 02/22/2017    9:54 AM 10/25/2016    8:45 AM  PHQ 2/9 Scores  PHQ - 2 Score 0 0 0 0 1 1 0  PHQ- 9 Score     1 3     Fall Risk    07/05/2022    9:42 AM 12/07/2021    9:09 AM 06/30/2021    9:56 AM 06/12/2021    9:02 AM 02/10/2021   10:26 AM  Fall Risk   Falls in the past year? 0 0 0 0 0  Number falls in past yr: 0 0 0 0 0  Injury with Fall? 0 0 0 0 0  Risk for fall due to : No Fall Risks      Follow up Falls evaluation completed  Falls prevention discussed      FALL RISK PREVENTION PERTAINING TO THE  HOME:  Any stairs in or around the home? Yes  If so, are there any without handrails? No  Home free of loose throw rugs in walkways, pet beds, electrical cords, etc? Yes  Adequate lighting in your home to reduce risk of falls? Yes   ASSISTIVE DEVICES UTILIZED TO PREVENT FALLS:  Life alert? No  Use of a cane, walker or w/c? No  Grab bars in the bathroom? No  Shower chair or bench in shower? Yes  Elevated toilet seat or a handicapped toilet? No   TIMED UP AND GO:  Was the test performed? No .    Cognitive Function:    11/06/2019    3:21 PM 07/10/2019    3:06 PM  MMSE - Mini Mental State Exam  Not completed: Refused   Orientation to time  3  Orientation to Place  3  Registration  3  Attention/ Calculation  4  Recall  0  Language- name 2 objects  2  Language- repeat  1  Language- follow 3 step command  3  Language- read & follow direction  1  Write a sentence  1  Copy design  1  Total score  22      06/12/2021   11:00 AM  Montreal Cognitive Assessment   Visuospatial/ Executive (0/5) 1  Naming (0/3) 0  Attention: Read list of digits (0/2) 1  Attention: Read list of letters (0/1) 0  Attention: Serial 7 subtraction starting at 100 (0/3) 0  Language: Repeat phrase (0/2) 1  Language : Fluency (0/1) 0  Abstraction (0/2) 0  Delayed Recall (0/5) 0  Orientation (0/6) 2  Total 5  Adjusted Score (based on education) 6      07/05/2022    9:47 AM  6CIT Screen  What Year? 0 points  What month? 0 points  What time? 0 points  Count back from 20 4 points  Months in reverse 4 points  Repeat phrase 10 points  Total Score 18 points    Immunizations Immunization History  Administered Date(s) Administered   Fluad Quad(high Dose 65+) 10/09/2019, 10/10/2020, 08/18/2021   Influenza Split 10/23/2012, 09/18/2014, 10/08/2015   Influenza, High Dose Seasonal PF 09/13/2016, 10/28/2017, 09/01/2018   Influenza-Unspecified 10/15/2009, 10/23/2010, 09/27/2011   Moderna SARS-COV2  Booster Vaccination 01/12/2021   PFIZER Comirnaty(Gray Top)Covid-19 Tri-Sucrose Vaccine 06/10/2021   PFIZER(Purple Top)SARS-COV-2 Vaccination 02/15/2020, 03/08/2020, 01/12/2021   Pneumococcal Conjugate-13 05/29/2018   Pneumococcal Polysaccharide-23 03/30/2022   Pneumococcal-Unspecified 09/27/2011   Zoster Recombinat (Shingrix) 06/10/2021    TDAP status: Up to date  Flu Vaccine status: Up to date  Pneumococcal vaccine status: Up to date  Covid-19 vaccine status: Information provided on how to obtain vaccines.   Qualifies for Shingles Vaccine? Yes   Zostavax completed No   Shingrix Completed?: Yes  Screening Tests Health Maintenance  Topic Date Due   COVID-19 Vaccine (4 - Booster for Pfizer series) 08/05/2021   Zoster Vaccines- Shingrix (2 of 2) 08/05/2021   INFLUENZA VACCINE  07/20/2022   TETANUS/TDAP  12/20/2022   HEMOGLOBIN A1C  12/30/2022   Pneumonia Vaccine 79+ Years old  Completed   DEXA SCAN  Completed   HPV VACCINES  Aged Out   FOOT EXAM  Discontinued   OPHTHALMOLOGY EXAM  Discontinued    Health Maintenance  Health Maintenance Due  Topic Date Due   COVID-19 Vaccine (4 - Booster for [place] series) 08/05/2021   Zoster Vaccines- Shingrix (2 of 2) 08/05/2021    Colorectal cancer screening: No longer required.   Mammogram status: Ordered 07/05/22. Pt provided with contact info and advised to call to schedule appt.   Bone Density status: Ordered 07/05/22. Pt provided with contact info and advised to call to schedule appt.  Lung Cancer Screening: (Low Dose CT Chest recommended if Age 63-80 years, 30 pack-year currently smoking OR have quit w/in 15years.) does not qualify.   Lung Cancer Screening Referral: n/a  Additional Screening:  Hepatitis C Screening: does not qualify; Completed aged out  Vision Screening: Recommended annual ophthalmology exams for early detection of glaucoma and other disorders of the eye. Is the patient up to date with their annual eye  exam?  No  Who is the provider or what is the name of the office in which the patient attends annual eye exams? [facility] If pt is not established with a provider, would they like to be referred to a provider to establish care? No .   Dental Screening: Recommended annual dental exams for proper oral hygiene  Community Resource Referral / Chronic Care Management: CRR required this visit?  No   CCM required this visit?  No      Plan:     I have personally reviewed and noted the following in the patient's chart:   Medical and social history Use of alcohol, tobacco or illicit drugs  Current medications and supplements including opioid prescriptions.  Functional ability and status Nutritional status Physical activity Advanced directives List of other physicians Hospitalizations, surgeries, and ER visits in previous 12 months Vitals Screenings to include cognitive, depression, and falls Referrals and appointments  In addition, I have reviewed and discussed with patient certain preventive protocols, quality  metrics, and best practice recommendations. A written personalized care plan for preventive services as well as general preventive health recommendations were provided to patient.   Due to this being a telephonic visit, the after visit summary with patients personalized plan was offered to patient via mail or my-chart. / Patient would like to access on my-chart.   Duard Brady Alissandra Geoffroy, [place]   07/05/2022   Nurse Notes: mammo and dexa

## 2022-07-06 ENCOUNTER — Telehealth: Payer: Self-pay | Admitting: Family

## 2022-07-06 DIAGNOSIS — R778 Other specified abnormalities of plasma proteins: Secondary | ICD-10-CM

## 2022-07-06 LAB — PROTEIN ELECTROPHORESIS, SERUM
Albumin ELP: 4.2 g/dL (ref 3.8–4.8)
Alpha 1: 0.3 g/dL (ref 0.2–0.3)
Alpha 2: 1 g/dL — ABNORMAL HIGH (ref 0.5–0.9)
Beta 2: 0.6 g/dL — ABNORMAL HIGH (ref 0.2–0.5)
Beta Globulin: 0.5 g/dL (ref 0.4–0.6)
Gamma Globulin: 1.4 g/dL (ref 0.8–1.7)
Total Protein: 8.1 g/dL (ref 6.1–8.1)

## 2022-07-06 LAB — TEST AUTHORIZATION

## 2022-07-06 LAB — IFE INTERPRETATION

## 2022-07-06 NOTE — Telephone Encounter (Signed)
Called patient's daughter about results but no answer, lvm

## 2022-07-06 NOTE — Telephone Encounter (Signed)
Please contact daughter and let her know that pt has an abnormal protein in her blood and I would like for her to meet with hematology for this.

## 2022-07-06 NOTE — Telephone Encounter (Signed)
Pt's daughter called back to go over labs.

## 2022-07-06 NOTE — Telephone Encounter (Signed)
Patient's daughter advised of results and referral.

## 2022-07-09 ENCOUNTER — Other Ambulatory Visit: Payer: Self-pay | Admitting: Physician Assistant

## 2022-07-13 ENCOUNTER — Telehealth: Payer: Self-pay | Admitting: Family

## 2022-07-13 ENCOUNTER — Ambulatory Visit (HOSPITAL_BASED_OUTPATIENT_CLINIC_OR_DEPARTMENT_OTHER)
Admission: RE | Admit: 2022-07-13 | Discharge: 2022-07-13 | Disposition: A | Discharge status: Home / Self Care | Payer: Medicare Other | Source: Ambulatory Visit | Attending: Family | Admitting: Family

## 2022-07-13 DIAGNOSIS — Z780 Asymptomatic menopausal state: Secondary | ICD-10-CM | POA: Diagnosis not present

## 2022-07-13 DIAGNOSIS — M8588 Other specified disorders of bone density and structure, other site: Secondary | ICD-10-CM | POA: Diagnosis not present

## 2022-07-13 DIAGNOSIS — M810 Age-related osteoporosis without current pathological fracture: Secondary | ICD-10-CM | POA: Diagnosis not present

## 2022-07-13 NOTE — Telephone Encounter (Signed)
Opened in error

## 2022-07-14 ENCOUNTER — Other Ambulatory Visit: Payer: Self-pay | Admitting: Family

## 2022-07-16 ENCOUNTER — Encounter: Payer: Self-pay | Admitting: Family

## 2022-07-16 ENCOUNTER — Telehealth: Payer: Self-pay | Admitting: Family

## 2022-07-16 DIAGNOSIS — M810 Age-related osteoporosis without current pathological fracture: Secondary | ICD-10-CM

## 2022-07-16 MED ORDER — CALTRATE 600+D PLUS MINERALS 600-800 MG-UNIT PO TABS: 1.0000 | ORAL_TABLET | Freq: Two times a day (BID) | ORAL | Status: DC

## 2022-07-16 MED ORDER — ALENDRONATE SODIUM 70 MG PO TABS: 70.0000 mg | ORAL_TABLET | ORAL | 4 refills | Status: DC

## 2022-07-16 NOTE — Telephone Encounter (Signed)
Please contact pt's daughter and let her know that her mom's bone density is showing osteoporosis.  Recommend that she start fosamax 70mg  once weekly. She needs to take in the AM and not lay down for at least 90 minutes after taking.    Also add caltrate 600mg  +D twice daily.

## 2022-07-16 NOTE — Telephone Encounter (Signed)
Patient's daughter advised of results, new medications and provider's advise. She verbalized understanding.

## 2022-07-22 ENCOUNTER — Inpatient Hospital Stay: Payer: Medicare Other

## 2022-07-22 ENCOUNTER — Inpatient Hospital Stay: Payer: Medicare Other | Admitting: Hematology & Oncology

## 2022-07-25 ENCOUNTER — Other Ambulatory Visit: Payer: Self-pay | Admitting: Family

## 2022-07-27 ENCOUNTER — Other Ambulatory Visit: Payer: Self-pay | Admitting: Family

## 2022-07-27 DIAGNOSIS — R778 Other specified abnormalities of plasma proteins: Secondary | ICD-10-CM

## 2022-07-28 ENCOUNTER — Other Ambulatory Visit: Payer: Self-pay

## 2022-07-28 ENCOUNTER — Encounter: Payer: Self-pay | Admitting: Family

## 2022-07-28 ENCOUNTER — Inpatient Hospital Stay: Payer: Medicare Other | Attending: Hematology & Oncology

## 2022-07-28 ENCOUNTER — Inpatient Hospital Stay (HOSPITAL_BASED_OUTPATIENT_CLINIC_OR_DEPARTMENT_OTHER): Payer: Medicare Other | Admitting: Family

## 2022-07-28 VITALS — BP 166/58 | HR 51 | Temp 98.3°F | Resp 19 | Ht 69.0 in | Wt 157.1 lb

## 2022-07-28 DIAGNOSIS — N184 Chronic kidney disease, stage 4 (severe): Secondary | ICD-10-CM

## 2022-07-28 DIAGNOSIS — E1122 Type 2 diabetes mellitus with diabetic chronic kidney disease: Secondary | ICD-10-CM

## 2022-07-28 DIAGNOSIS — I129 Hypertensive chronic kidney disease with stage 1 through stage 4 chronic kidney disease, or unspecified chronic kidney disease: Secondary | ICD-10-CM

## 2022-07-28 DIAGNOSIS — R778 Other specified abnormalities of plasma proteins: Secondary | ICD-10-CM | POA: Diagnosis not present

## 2022-07-28 LAB — CMP (CANCER CENTER ONLY)
ALT: 10 U/L (ref 0–44)
AST: 19 U/L (ref 15–41)
Albumin: 4.4 g/dL (ref 3.5–5.0)
Alkaline Phosphatase: 49 U/L (ref 38–126)
Anion gap: 11 (ref 5–15)
BUN: 25 mg/dL — ABNORMAL HIGH (ref 8–23)
CO2: 24 mmol/L (ref 22–32)
Calcium: 8.6 mg/dL — ABNORMAL LOW (ref 8.9–10.3)
Chloride: 104 mmol/L (ref 98–111)
Creatinine: 1.6 mg/dL — ABNORMAL HIGH (ref 0.44–1.00)
GFR, Estimated: 32 mL/min — ABNORMAL LOW (ref >60)
Glucose, Bld: 86 mg/dL (ref 70–99)
Potassium: 4.1 mmol/L (ref 3.5–5.1)
Sodium: 139 mmol/L (ref 135–145)
Total Bilirubin: 0.6 mg/dL (ref 0.3–1.2)
Total Protein: 7.2 g/dL (ref 6.5–8.1)

## 2022-07-28 LAB — LACTATE DEHYDROGENASE: LDH: 207 U/L — ABNORMAL HIGH (ref 98–192)

## 2022-07-28 NOTE — Progress Notes (Signed)
Hematology/Oncology Consultation   Name: Sherry Perez      MRN: [id number]    Location: Room/bed info not found  Date: 07/28/2022 Time:3:48 PM   REFERRING PHYSICIAN: [name], NP  REASON FOR CONSULT:  Abnormal SPEP   DIAGNOSIS: Abnormal SPEP    HISTORY OF PRESENT ILLNESS: Ms. Sherry Perez is a very pleasant 81 yo [place] American female here today with her daughter for recent abnormal SPEP.  Recent SPEP showed an alpha 2 of 1.0 and beta 2 of 0.6. No M-spike noted.  She has dementia and is on Namenda. Most of her health history today was obtained from her daughter.  She also has history of chronic kidney disease stage 4 and is borderline diabetic.  No history of thyroid disease.  No history of cancer.  No fever, chills, n/v, cough, rash, dizziness, SOB, chest pain, palpitations, abdominal pain or changes in bowel or bladder habits.  No bleeding, bruising or petechiae.  No swelling, numbness or tingling in her extremities.  She has tenderness at times in her knees due to arthritis.  No falls or syncope to report.  No smoking, ETOH or recreation drug use.  Her appetite comes and goes and she is doing her best to stay well hydrated. Her weight is stable at 157 lbs.  She lives with her daughter.  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   ROS: All other 10 point review of systems is negative.   PAST MEDICAL HISTORY:   Past Medical History:  Diagnosis Date   Diabetes mellitus without complication (HCC)    HTN (hypertension)    Hyperlipidemia    Osteoporosis     ALLERGIES: Allergies  Allergen Reactions   Lisinopril Other (See Comments)    Renal insufficiency      MEDICATIONS:  Current Outpatient Medications on File Prior to Visit  Medication Sig Dispense Refill   acetaminophen (TYLENOL) 500 MG tablet Take 2 tablets (1,000 mg total) by mouth 2 (two) times daily. 30 tablet 0   alendronate (FOSAMAX) 70 MG tablet Take 1 tablet (70 mg total) by mouth every  7 (seven) days. Take with a full glass of water on an empty stomach. 12 tablet 4   amLODipine (NORVASC) 10 MG tablet TAKE ONE TABLET BY MOUTH ONE TIME DAILY 90 tablet 1   atorvastatin (LIPITOR) 80 MG tablet TAKE ONE TABLET BY MOUTH ONE TIME DAILY 90 tablet 1   Calcium Carbonate-Vit D-Min (CALTRATE 600+D PLUS MINERALS) 600-800 MG-UNIT TABS Take 1 tablet by mouth in the morning and at bedtime.     Cyanocobalamin (VITAMIN B12 PO) Take 1 tablet by mouth daily.     furosemide (LASIX) 40 MG tablet TAKE ONE TABLET BY MOUTH ONE TIME DAILY 90 tablet 1   hydrALAZINE (APRESOLINE) 25 MG tablet TAKE ONE TABLET BY MOUTH THREE TIMES A DAY 270 tablet 1   memantine (NAMENDA) 10 MG tablet TAKE ONE TABLET BY MOUTH EVERY EVENING FOR 2 WEEKS THEN INCREASE TO TAKE ONE TABLET BY MOUTH TWICE A DAY 60 tablet 3   metoprolol succinate (TOPROL-XL) 100 MG 24 hr tablet TAKE ONE TABLET BY MOUTH ONE TIME DAILY WITH FOOD OR IMMEDIATELY FOLLOWING A MEAL 90 tablet 1   potassium chloride SA (KLOR-CON M) 20 MEQ tablet TAKE ONE-HALF TABLET BY MOUTH ONE TIME DAILY 45 tablet 1   cloNIDine (CATAPRES) 0.1 MG tablet Take 1 tablet (0.1 mg total) by mouth daily. 30 tablet 2   No current facility-administered medications on file  prior to visit.     PAST SURGICAL HISTORY Past Surgical History:  Procedure Laterality Date   ABDOMINAL HYSTERECTOMY     cataract surgery Left 02/04/2021    FAMILY HISTORY: Family History  Problem Relation Age of Onset   Diabetes Mother    Diabetes Brother     SOCIAL HISTORY:  reports that she has never smoked. She has never used smokeless tobacco. She reports that she does not drink alcohol and does not use drugs.  PERFORMANCE STATUS: The patient's performance status is 1 - Symptomatic but completely ambulatory  PHYSICAL EXAM: Most Recent Vital Signs: Blood pressure (!) 166/58, pulse (!) 51, temperature 98.3 F (36.8 C), temperature source Oral, resp. rate 19, height 5\' 9"  (1.753 m), weight 157 lb  1.9 oz (71.3 kg), SpO2 100 %. BP (!) 166/58 (BP Location: Left Arm, Patient Position: Sitting)   Pulse (!) 51   Temp 98.3 F (36.8 C) (Oral)   Resp 19   Ht 5\' 9"  (1.753 m)   Wt 157 lb 1.9 oz (71.3 kg)   SpO2 100%   BMI 23.20 kg/m   General Appearance:    Alert, cooperative, no distress, appears stated age  Head:    Normocephalic, without obvious abnormality, atraumatic  Eyes:    PERRL, conjunctiva/corneas clear, EOM's intact, fundi    benign, both eyes        Throat:   Lips, mucosa, and tongue normal; teeth and gums normal  Neck:   Supple, symmetrical, trachea midline, no adenopathy;    thyroid:  no enlargement/tenderness/nodules; no carotid   bruit or JVD  Back:     Symmetric, no curvature, ROM normal, no CVA tenderness  Lungs:     Clear to auscultation bilaterally, respirations unlabored  Chest Wall:    No tenderness or deformity   Heart:    Regular rate and rhythm, S1 and S2 normal, no murmur, rub   or gallop     Abdomen:     Soft, non-tender, bowel sounds active all four quadrants,    no masses, no organomegaly        Extremities:   Extremities normal, atraumatic, no cyanosis or edema  Pulses:   2+ and symmetric all extremities  Skin:   Skin color, texture, turgor normal, no rashes or lesions  Lymph nodes:   Cervical, supraclavicular, and axillary nodes normal  Neurologic:   CNII-XII intact, normal strength, sensation and reflexes    throughout    LABORATORY DATA:  Results for orders placed or performed in visit on 07/28/22 (from the past 48 hour(s))  CMP ([place] only)     Status: Abnormal   Collection Time: 07/28/22  2:44 PM  Result Value Ref Range   Sodium 139 135 - 145 mmol/L   Potassium 4.1 3.5 - 5.1 mmol/L   Chloride 104 98 - 111 mmol/L   CO2 24 22 - 32 mmol/L   Glucose, Bld 86 70 - 99 mg/dL    Comment: Glucose reference range applies only to samples taken after fasting for at least 8 hours.   BUN 25 (H) 8 - 23 mg/dL   Creatinine 1.60 (H) 0.44 - 1.00  mg/dL   Calcium 8.6 (L) 8.9 - 10.3 mg/dL   Total Protein 7.2 6.5 - 8.1 g/dL   Albumin 4.4 3.5 - 5.0 g/dL   AST 19 15 - 41 U/L   ALT 10 0 - 44 U/L   Alkaline Phosphatase 49 38 - 126 U/L   Total Bilirubin 0.6  0.3 - 1.2 mg/dL   GFR, Estimated 32 (L) >60 mL/min    Comment: (NOTE) Calculated using the CKD-EPI Creatinine Equation (2021)    Anion gap 11 5 - 15    Comment: Performed at [facility] at [facility], [address], [place], [place] [address]  Lactate dehydrogenase (LDH)     Status: Abnormal   Collection Time: 07/28/22  2:45 PM  Result Value Ref Range   LDH 207 (H) 98 - 192 U/L    Comment: Performed at [facility] at [facility], [address], [place], [place] [address]      RADIOGRAPHY: No results found.     PATHOLOGY: None  ASSESSMENT/PLAN: Ms. Borneman is a very pleasant 81 yo [place] American female here today with her daughter for recent abnormal SPEP.  Protein studies are pending. Unable to get CBC when drawing labs after multiple sticks.  They declined the 24 hour urine.  Follow-up pending lab results.   All questions were answered. The patient knows to call the clinic with any problems, questions or concerns. We can certainly see the patient much sooner if necessary.  The patient was discussed with Dr. Marin Olp and he is in agreement with the aforementioned.   Lottie Dawson, NP

## 2022-07-29 ENCOUNTER — Telehealth: Payer: Self-pay | Admitting: *Deleted

## 2022-07-29 LAB — KAPPA/LAMBDA LIGHT CHAINS
Kappa free light chain: 56 mg/L — ABNORMAL HIGH (ref 3.3–19.4)
Kappa, lambda light chain ratio: 1.53 (ref 0.26–1.65)
Lambda free light chains: 36.7 mg/L — ABNORMAL HIGH (ref 5.7–26.3)

## 2022-07-29 LAB — IGG, IGA, IGM
IgA: 522 mg/dL — ABNORMAL HIGH (ref 64–422)
IgG (Immunoglobin G), Serum: 1341 mg/dL (ref 586–1602)
IgM (Immunoglobulin M), Srm: 69 mg/dL (ref 26–217)

## 2022-07-29 NOTE — Telephone Encounter (Signed)
Per 07/29/22 los -   Pending lab results.

## 2022-07-30 ENCOUNTER — Telehealth: Payer: Self-pay | Admitting: Family

## 2022-07-30 NOTE — Telephone Encounter (Signed)
-----   Message from Corliss Parish, MD sent at 07/30/2022 12:05 PM EDT ----- Sherry Perez is fine for her.  Sorry for the delayed response-  I do not check this email often   Corliss Parish ----- Message ----- From: [name], NP Sent: 07/13/2022   1:22 PM EDT To: Corliss Parish, MD  Dr. Moshe Cipro,  This pt should be seeing your for follow up soon if she has not already. Please her her bone density. With her renal function, what are your thoughts on starting her on prolia?  Thanks,  [employer]

## 2022-07-30 NOTE — Telephone Encounter (Signed)
Based on her bone density results, I would recommend that she begin Prolia.  Please notify daughter and begin insurance approval process.

## 2022-07-31 NOTE — Telephone Encounter (Signed)
Prolia VOB initiated via [email or web address] ? ?New start ? ?

## 2022-08-02 LAB — PROTEIN ELECTROPHORESIS, SERUM
A/G Ratio: 1.1 (ref 0.7–1.7)
Albumin ELP: 3.9 g/dL (ref 2.9–4.4)
Alpha-1-Globulin: 0.3 g/dL (ref 0.0–0.4)
Alpha-2-Globulin: 0.9 g/dL (ref 0.4–1.0)
Beta Globulin: 1.2 g/dL (ref 0.7–1.3)
Gamma Globulin: 1.3 g/dL (ref 0.4–1.8)
Globulin, Total: 3.7 g/dL (ref 2.2–3.9)
Total Protein ELP: 7.6 g/dL (ref 6.0–8.5)

## 2022-08-05 ENCOUNTER — Telehealth: Payer: Self-pay

## 2022-08-05 NOTE — Telephone Encounter (Signed)
Advised via MyChart.

## 2022-08-05 NOTE — Telephone Encounter (Signed)
-----   Message from Celso Amy, NP sent at 08/05/2022 11:09 AM EDT ----- Protein studies came back stable. No M-spike! No need for follow-up. Please call and let her daughter know. She is so precious! Take care!  Sarah   ----- Message ----- From: Buel Ream, [facility] [place] Sent: 07/28/2022   3:48 PM EDT To: Celso Amy, NP

## 2022-08-06 NOTE — Telephone Encounter (Signed)
Prior auth required for PROLIA  PA PROCESS DETAILS: Effective 12/20/2021 if the patient is new to Prolia, Prior authorization and Step Therapy are required & not on file. Please go to [email or web address] or call [phone] to initiate the prior authorization. For exception to the policy please visit [email or web address] and review Policy Number [id number] 

## 2022-08-09 ENCOUNTER — Encounter: Payer: Self-pay | Admitting: Family

## 2022-08-12 MED ORDER — ALENDRONATE SODIUM 70 MG PO TABS: 70.0000 mg | ORAL_TABLET | ORAL | 4 refills | Status: DC

## 2022-08-12 MED ORDER — POTASSIUM CHLORIDE CRYS ER 20 MEQ PO TBCR: EXTENDED_RELEASE_TABLET | ORAL | 1 refills | Status: DC

## 2022-08-13 ENCOUNTER — Other Ambulatory Visit: Payer: Self-pay | Admitting: Family

## 2022-08-25 NOTE — Telephone Encounter (Signed)
Prior Authorization initiated for [facility] via [facility] Provider portal.  Case ID: [id number]

## 2022-08-26 ENCOUNTER — Telehealth: Payer: Self-pay | Admitting: Family

## 2022-08-26 NOTE — Telephone Encounter (Signed)
[facility] rx states prolia is not a preferred drug and ins would rather pt try an oral and an injectable first. Lucy from [employer] states preferred oral is actonel and fozamax and preferred injectables would be reclast, aredia, and boniva. If pcp would like to send any of these instead her number is [phone] ext. [phone] (secure vm).

## 2022-08-26 NOTE — Telephone Encounter (Signed)
Sherry Perez, CMA Initiated PA process for prolia 08/25/22. Patient will be notified when we get a determination.

## 2022-08-28 NOTE — Telephone Encounter (Signed)
Pt ready for scheduling on or after 08/28/22  Out-of-pocket cost due at time of visit: $311  Primary: [place] Medicare Adv Prolia co-insurance: 20% (approximately $276) Admin fee co-insurance: $35  Secondary: n/a Prolia co-insurance:  Admin fee co-insurance:   Deductible: does not apply  Prior Auth: APPROVED PA# [id number] Valid:08/25/22-08/26/23  ** This summary of benefits is an estimation of the patient's out-of-pocket cost. Exact cost may vary based on individual plan coverage.

## 2022-08-28 NOTE — Telephone Encounter (Signed)
PA# [id number] Valid:08/25/22-08/26/23

## 2022-08-31 NOTE — Telephone Encounter (Signed)
MyChart Message sent  

## 2022-09-28 ENCOUNTER — Ambulatory Visit (INDEPENDENT_AMBULATORY_CARE_PROVIDER_SITE_OTHER): Payer: Medicare Other | Admitting: Family

## 2022-09-28 ENCOUNTER — Encounter: Payer: Self-pay | Admitting: Family

## 2022-09-28 VITALS — BP 138/60 | HR 48 | Temp 98.0°F | Resp 16 | Wt 159.2 lb

## 2022-09-28 DIAGNOSIS — I10 Essential (primary) hypertension: Secondary | ICD-10-CM

## 2022-09-28 DIAGNOSIS — F03B0 Unspecified dementia, moderate, without behavioral disturbance, psychotic disturbance, mood disturbance, and anxiety: Secondary | ICD-10-CM | POA: Diagnosis not present

## 2022-09-28 DIAGNOSIS — R7303 Prediabetes: Secondary | ICD-10-CM | POA: Diagnosis not present

## 2022-09-28 DIAGNOSIS — Z23 Encounter for immunization: Secondary | ICD-10-CM

## 2022-09-28 DIAGNOSIS — R778 Other specified abnormalities of plasma proteins: Secondary | ICD-10-CM | POA: Diagnosis not present

## 2022-09-28 DIAGNOSIS — N184 Chronic kidney disease, stage 4 (severe): Secondary | ICD-10-CM | POA: Diagnosis not present

## 2022-09-28 DIAGNOSIS — M810 Age-related osteoporosis without current pathological fracture: Secondary | ICD-10-CM | POA: Diagnosis not present

## 2022-09-28 DIAGNOSIS — E559 Vitamin D deficiency, unspecified: Secondary | ICD-10-CM | POA: Diagnosis not present

## 2022-09-28 DIAGNOSIS — E538 Deficiency of other specified B group vitamins: Secondary | ICD-10-CM

## 2022-09-28 NOTE — Assessment & Plan Note (Signed)
Maintained on fosamax weekly and caltrate bid.

## 2022-09-28 NOTE — Progress Notes (Signed)
Subjective:   By signing my name below, I, Carylon Perches, attest that this documentation has been prepared under the direction and in the presence of Karie Chimera, NP 09/28/2022   Patient ID: Sherry Perez, female    DOB: January 22, 1941, 81 y.o.   MRN: [id number]  Chief Complaint  Patient presents with   Hypertension    Here for follow up    HPI Patient is in today for an office visit. She is accompanied by her daughter.   Blood Pressure: Her blood pressure is improving. She is currently taking 10 Mg of Amlodipine and 25 Mg of Hydralazine.  BP Readings from Last 3 Encounters:  09/28/22 138/60  07/28/22 (!) 166/58  06/29/22 134/68   Pulse Readings from Last 3 Encounters:  09/28/22 (!) 48  07/28/22 (!) 51  06/29/22 81   Diabetes: Her A1C levels are improving Lab Results  Component Value Date   HGBA1C 5.7 06/29/2022   Hematology: Her family member reports that the patient has went to the hematologist for her abnormal blood readings. She reports that she does not need to follow-up after her previous appointment  Kidney: Her daughter reports that the patient will be going for a second visit at the end of this month  Memory: Her memory is about the same. She is currently taking 10 Mg of Namenda.   Bone Density: She is currently taking 70 Mg of Fosamac and 600+ D Plus Caltrate  Vitamin B12: Her daughter reports that the patient is currently taking Vitamin B12 supplements.    Immunizations: She is interested in receiving an influenza vaccine during today's visit.   Health Maintenance Due  Topic Date Due   Diabetic kidney evaluation - Urine ACR  06/07/2017   COVID-19 Vaccine (4 - Pfizer risk series) 08/05/2021   Zoster Vaccines- Shingrix (2 of 2) 08/05/2021   INFLUENZA VACCINE  07/20/2022    Past Medical History:  Diagnosis Date   Diabetes mellitus without complication (HCC)    HTN (hypertension)    Hyperlipidemia    Osteoporosis     Past Surgical History:   Procedure Laterality Date   ABDOMINAL HYSTERECTOMY     cataract surgery Left 02/04/2021    Family History  Problem Relation Age of Onset   Diabetes Mother    Diabetes Brother     Social History   Socioeconomic History   Marital status: Widowed    Spouse name: Not on file   Number of children: Not on file   Years of education: Not on file   Highest education level: Not on file  Occupational History   Not on file  Tobacco Use   Smoking status: Never   Smokeless tobacco: Never  Vaping Use   Vaping Use: Never used  Substance and Sexual Activity   Alcohol use: No    Alcohol/week: 0.0 standard drinks of alcohol   Drug use: Never   Sexual activity: Not on file  Other Topics Concern   Not on file  Social History Narrative   4 children (3 daughters 1 son)- all local   7 grandchildren    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)   Widowed (husband passed 2016)   Lives with [name]   Enjoys cross word puzzle, adult coloring books, reads bible   Right handed    Social Determinants of Health   Financial Resource Strain: [place]  (06/30/2021)   Overall Financial Resource Strain (CARDIA)    Difficulty of Paying Living Expenses:  Not hard at all  Food Insecurity: No Food Insecurity (06/30/2021)   Hunger Vital Sign    Worried About Running Out of Food in the Last Year: Never true    Ran Out of Food in the Last Year: Never true  Transportation Needs: No Transportation Needs (06/30/2021)   PRAPARE - Hydrologist (Medical): No    Lack of Transportation (Non-Medical): No  Physical Activity: Inactive (06/30/2021)   Exercise Vital Sign    Days of Exercise per Week: 0 days    Minutes of Exercise per Session: 0 min  Stress: No Stress Concern Present (06/30/2021)   [place]    Feeling of Stress : Not at all  Social Connections: Moderately Isolated (06/30/2021)   Social Connection and  Isolation Panel [NHANES]    Frequency of Communication with Friends and Family: More than three times a week    Frequency of Social Gatherings with Friends and Family: More than three times a week    Attends Religious Services: More than 4 times per year    Active Member of [employer] or Organizations: No    Attends Archivist Meetings: Never    Marital Status: Widowed  Intimate Partner Violence: Not At Risk (06/30/2021)   Humiliation, Afraid, Rape, and Kick questionnaire    Fear of Current or Ex-Partner: No    Emotionally Abused: No    Physically Abused: No    Sexually Abused: No    Outpatient Medications Prior to Visit  Medication Sig Dispense Refill   acetaminophen (TYLENOL) 500 MG tablet Take 2 tablets (1,000 mg total) by mouth 2 (two) times daily. 30 tablet 0   alendronate (FOSAMAX) 70 MG tablet Take 1 tablet (70 mg total) by mouth every 7 (seven) days. Take with a full glass of water on an empty stomach. 12 tablet 4   amLODipine (NORVASC) 10 MG tablet TAKE ONE TABLET BY MOUTH ONE TIME DAILY 90 tablet 1   atorvastatin (LIPITOR) 80 MG tablet TAKE ONE TABLET BY MOUTH ONE TIME DAILY 90 tablet 1   Calcium Carbonate-Vit D-Min (CALTRATE 600+D PLUS MINERALS) 600-800 MG-UNIT TABS Take 1 tablet by mouth in the morning and at bedtime.     cloNIDine (CATAPRES) 0.1 MG tablet Take 1 tablet (0.1 mg total) by mouth daily. 30 tablet 2   Cyanocobalamin (VITAMIN B12 PO) Take 1 tablet by mouth daily.     furosemide (LASIX) 40 MG tablet TAKE ONE TABLET BY MOUTH ONE TIME DAILY 90 tablet 1   hydrALAZINE (APRESOLINE) 25 MG tablet Take 1 tablet (25 mg total) by mouth 3 (three) times daily. 270 tablet 1   memantine (NAMENDA) 10 MG tablet TAKE ONE TABLET BY MOUTH EVERY EVENING FOR 2 WEEKS THEN INCREASE TO TAKE ONE TABLET BY MOUTH TWICE A DAY 60 tablet 3   metoprolol succinate (TOPROL-XL) 100 MG 24 hr tablet TAKE ONE TABLET BY MOUTH ONE TIME DAILY WITH FOOD OR IMMEDIATELY FOLLOWING A MEAL 90 tablet 1    potassium chloride SA (KLOR-CON M) 20 MEQ tablet TAKE ONE-HALF TABLET BY MOUTH ONE TIME DAILY 45 tablet 1   No facility-administered medications prior to visit.    Allergies  Allergen Reactions   Lisinopril Other (See Comments)    Renal insufficiency    ROS See HPI    Objective:    Physical Exam Constitutional:      General: She is not in acute distress.    Appearance: Normal  appearance. She is not ill-appearing.  HENT:     Head: Normocephalic and atraumatic.     Right Ear: External ear normal.     Left Ear: External ear normal.  Eyes:     Extraocular Movements: Extraocular movements intact.     Pupils: Pupils are equal, round, and reactive to light.  Cardiovascular:     Rate and Rhythm: Normal rate and regular rhythm.     Heart sounds: Normal heart sounds. No murmur heard.    No gallop.  Pulmonary:     Effort: Pulmonary effort is normal. No respiratory distress.     Breath sounds: Normal breath sounds. No wheezing or rales.  Musculoskeletal:     Right lower leg: 1+ Edema present.     Left lower leg: 1+ Edema present.  Skin:    General: Skin is warm and dry.  Neurological:     Mental Status: She is alert and oriented to person, place, and time.  Psychiatric:        Mood and Affect: Mood normal.        Behavior: Behavior normal.        Judgment: Judgment normal.     BP 138/60 (BP Location: Right Arm, Patient Position: Sitting, Cuff Size: Small)   Pulse (!) 48   Temp 98 F (36.7 C) (Oral)   Resp 16   Wt 159 lb 3.2 oz (72.2 kg)   SpO2 99%   BMI 23.51 kg/m  Wt Readings from Last 3 Encounters:  09/28/22 159 lb 3.2 oz (72.2 kg)  07/28/22 157 lb 1.9 oz (71.3 kg)  06/29/22 156 lb 12.8 oz (71.1 kg)       Assessment & Plan:   Problem List Items Addressed This Visit       Unprioritized   Vitamin D deficiency   Relevant Orders   VITAMIN D 25 Hydroxy (Vit-D Deficiency, Fractures)   Osteoporosis    Maintained on fosamax weekly and caltrate bid.       HTN  (hypertension)    BP Readings from Last 3 Encounters:  09/28/22 138/60  07/28/22 (!) 166/58  06/29/22 134/68  Continue amlodipine, hydralazine, and metoprolol.        Dementia ([place])    Stable on namenda.  Continue same.       CKD (chronic kidney disease) stage 4, GFR 15-29 ml/min (HCC)    Has follow up visit with nephrology later this month.   Lab Results  Component Value Date   CREATININE 1.60 (H) 07/28/2022         Borderline diabetes    Lab Results  Component Value Date   HGBA1C 5.7 06/29/2022   HGBA1C 6.0 03/30/2022   HGBA1C 5.5 10/10/2020   Lab Results  Component Value Date   MICROALBUR 0.7 06/07/2016   LDLCALC 91 08/18/2021   CREATININE 1.60 (H) 07/28/2022  Stable/improved A1C.       Relevant Orders   Hemoglobin A1c   Abnormal SPEP    Saw Heme/onc, had stable follow up studies (No M-Spike) and no follow up needed with them.       Other Visit Diagnoses     Needs flu shot    -  Primary   Relevant Orders   Flu Vaccine QUAD High Dose(Fluad)   B12 deficiency       Relevant Orders   B12      No orders of the defined types were placed in this encounter.   I, Nance Pear, NP, personally preformed  the services described in this documentation.  All medical record entries made by the scribe were at my direction and in my presence.  I have reviewed the chart and discharge instructions (if applicable) and agree that the record reflects my personal performance and is accurate and complete. 09/28/2022   I,Amber Collins,acting as a scribe for Nance Pear, NP.,have documented all relevant documentation on the behalf of Nance Pear, NP,as directed by  Nance Pear, NP while in the presence of Nance Pear, NP.    Nance Pear, NP

## 2022-09-28 NOTE — Assessment & Plan Note (Signed)
Stable on namenda.  Continue same.

## 2022-09-28 NOTE — Assessment & Plan Note (Signed)
Has follow up visit with nephrology later this month.   Lab Results  Component Value Date   CREATININE 1.60 (H) 07/28/2022

## 2022-09-28 NOTE — Assessment & Plan Note (Signed)
BP Readings from Last 3 Encounters:  09/28/22 138/60  07/28/22 (!) 166/58  06/29/22 134/68  Continue amlodipine, hydralazine, and metoprolol.

## 2022-09-28 NOTE — Assessment & Plan Note (Signed)
Saw Heme/onc, had stable follow up studies (No M-Spike) and no follow up needed with them.

## 2022-09-28 NOTE — Assessment & Plan Note (Signed)
Lab Results  Component Value Date   HGBA1C 5.7 06/29/2022   HGBA1C 6.0 03/30/2022   HGBA1C 5.5 10/10/2020   Lab Results  Component Value Date   MICROALBUR 0.7 06/07/2016   LDLCALC 91 08/18/2021   CREATININE 1.60 (H) 07/28/2022   Stable/improved A1C.

## 2022-09-29 ENCOUNTER — Telehealth: Payer: Self-pay | Admitting: Family

## 2022-09-29 LAB — HEMOGLOBIN A1C: Hgb A1c MFr Bld: 5.9 % (ref 4.6–6.5)

## 2022-09-29 LAB — VITAMIN D 25 HYDROXY (VIT D DEFICIENCY, FRACTURES): VITD: 14.58 ng/mL — ABNORMAL LOW (ref 30.00–100.00)

## 2022-09-29 LAB — VITAMIN B12: Vitamin B-12: >1500 pg/mL — ABNORMAL HIGH (ref 211–911)

## 2022-09-29 MED ORDER — VITAMIN D (ERGOCALCIFEROL) 1.25 MG (50000 UNIT) PO CAPS: 50000.0000 [IU] | ORAL_CAPSULE | ORAL | 0 refills | Status: DC

## 2022-09-29 NOTE — Telephone Encounter (Signed)
B12 looks good.  Vitamin D level is low.  Advise patient to begin vit D 50000 units once weekly for 12 weeks, then repeat vit D level (dx Vit D deficiency).

## 2022-09-30 NOTE — Telephone Encounter (Signed)
Lvm for patient's daughter to call back

## 2022-10-01 NOTE — Telephone Encounter (Signed)
Results given to patient's daughter POA Verlon Carcione. She verbalize understanding and will p/up rx at [place].

## 2022-10-12 ENCOUNTER — Other Ambulatory Visit: Payer: Self-pay | Admitting: Family

## 2022-11-08 ENCOUNTER — Other Ambulatory Visit (HOSPITAL_COMMUNITY): Payer: Self-pay

## 2022-11-08 NOTE — Telephone Encounter (Signed)
Forwarding to Rx prior auth team 

## 2022-11-08 NOTE — Telephone Encounter (Signed)
Pharmacy Patient Advocate Encounter  Insurance verification completed.    The patient is insured through [employer]   Ran test claims for: Prolia 60mg .  Pharmacy benefit copay: $10.35

## 2022-11-10 ENCOUNTER — Other Ambulatory Visit: Payer: Self-pay | Admitting: Family

## 2022-11-10 ENCOUNTER — Encounter: Payer: Self-pay | Admitting: Family

## 2022-11-14 NOTE — Telephone Encounter (Signed)
Saw a refill request for vit D weekly.  It is too soon to refill. Please make sure daughter knows that pt should be taking weekly not daily.

## 2022-11-15 ENCOUNTER — Other Ambulatory Visit: Payer: Self-pay

## 2022-11-15 MED ORDER — ATORVASTATIN CALCIUM 80 MG PO TABS: 80.0000 mg | ORAL_TABLET | Freq: Every day | ORAL | 1 refills | Status: DC

## 2022-11-15 MED ORDER — CLONIDINE HCL 0.1 MG PO TABS: 0.1000 mg | ORAL_TABLET | Freq: Every day | ORAL | 2 refills | Status: DC

## 2022-11-15 MED ORDER — MEMANTINE HCL 10 MG PO TABS: ORAL_TABLET | ORAL | 3 refills | Status: DC

## 2022-11-15 NOTE — Telephone Encounter (Signed)
Mychart message sent. Please let Kristine Garbe know if pt agrees so Rx can be sent to Pharmacy.

## 2022-11-15 NOTE — Telephone Encounter (Signed)
Patient's daughter will check when she gets home and let us know if refill needed or not. She is aware this is a once a week medication.

## 2022-11-16 DIAGNOSIS — N289 Disorder of kidney and ureter, unspecified: Secondary | ICD-10-CM | POA: Diagnosis not present

## 2022-11-16 DIAGNOSIS — N390 Urinary tract infection, site not specified: Secondary | ICD-10-CM | POA: Diagnosis not present

## 2022-11-16 DIAGNOSIS — I129 Hypertensive chronic kidney disease with stage 1 through stage 4 chronic kidney disease, or unspecified chronic kidney disease: Secondary | ICD-10-CM | POA: Diagnosis not present

## 2022-12-27 ENCOUNTER — Encounter: Payer: Self-pay | Admitting: Family

## 2022-12-27 NOTE — Telephone Encounter (Signed)
Called daughter and scheduled patient for a [facility] evaluation for tomorrow

## 2022-12-28 ENCOUNTER — Ambulatory Visit: Payer: Medicare Other | Admitting: Family

## 2022-12-29 ENCOUNTER — Ambulatory Visit: Payer: Medicare Other | Admitting: Family

## 2022-12-29 NOTE — Progress Notes (Shared)
Subjective:   By signing my name below, I, Shehryar Baig, attest that this documentation has been prepared under the direction and in the presence of [name], NP. 12/29/2022   Patient ID: Sherry Perez, female    DOB: 08-Oct-1941, 82 y.o.   MRN: [id number]  No chief complaint on file.   HPI Patient is in today for a office visit.     Past Medical History:  Diagnosis Date   Diabetes mellitus without complication (HCC)    HTN (hypertension)    Hyperlipidemia    Osteoporosis     Past Surgical History:  Procedure Laterality Date   ABDOMINAL HYSTERECTOMY     cataract surgery Left 02/04/2021    Family History  Problem Relation Age of Onset   Diabetes Mother    Diabetes Brother     Social History   Socioeconomic History   Marital status: Widowed    Spouse name: Not on file   Number of children: Not on file   Years of education: Not on file   Highest education level: Not on file  Occupational History   Not on file  Tobacco Use   Smoking status: Never   Smokeless tobacco: Never  Vaping Use   Vaping Use: Never used  Substance and Sexual Activity   Alcohol use: No    Alcohol/week: 0.0 standard drinks of alcohol   Drug use: Never   Sexual activity: Not on file  Other Topics Concern   Not on file  Social History Narrative   4 children (3 daughters 1 son)- all local   7 grandchildren    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)   Widowed (husband passed 2016)   Lives with [name]   Enjoys cross word puzzle, adult coloring books, reads bible   Right handed    Social Determinants of Health   Financial Resource Strain: Low Risk  (06/30/2021)   Overall Financial Resource Strain (CARDIA)    Difficulty of Paying Living Expenses: Not hard at all  Food Insecurity: No [place] (06/30/2021)   Hunger Vital Sign    Worried About Running Out of Food in the Last Year: Never true    [place] in the Last Year: Never true  Transportation Needs:  No Transportation Needs (06/30/2021)   PRAPARE - Hydrologist (Medical): No    Lack of Transportation (Non-Medical): No  Physical Activity: Inactive (06/30/2021)   Exercise Vital Sign    Days of Exercise per Week: 0 days    Minutes of Exercise per Session: 0 min  Stress: No Stress Concern Present (06/30/2021)   [place]    Feeling of Stress : Not at all  Social Connections: Moderately Isolated (06/30/2021)   Social Connection and Isolation Panel [NHANES]    Frequency of Communication with Friends and Family: More than three times a week    Frequency of Social Gatherings with Friends and Family: More than three times a week    Attends Religious Services: More than 4 times per year    Active Member of [employer] or Organizations: No    Attends Archivist Meetings: Never    Marital Status: Widowed  Intimate Partner Violence: Not At Risk (06/30/2021)   Humiliation, Afraid, Rape, and Kick questionnaire    Fear of Current or Ex-Partner: No    Emotionally Abused: No    Physically Abused: No    Sexually Abused:  No    Outpatient Medications Prior to Visit  Medication Sig Dispense Refill   acetaminophen (TYLENOL) 500 MG tablet Take 2 tablets (1,000 mg total) by mouth 2 (two) times daily. 30 tablet 0   alendronate (FOSAMAX) 70 MG tablet Take 1 tablet (70 mg total) by mouth every 7 (seven) days. Take with a full glass of water on an empty stomach. 12 tablet 4   amLODipine (NORVASC) 10 MG tablet TAKE ONE TABLET BY MOUTH ONE TIME DAILY 90 tablet 1   atorvastatin (LIPITOR) 80 MG tablet Take 1 tablet (80 mg total) by mouth daily. 90 tablet 1   Calcium Carbonate-Vit D-Min (CALTRATE 600+D PLUS MINERALS) 600-800 MG-UNIT TABS Take 1 tablet by mouth in the morning and at bedtime.     cloNIDine (CATAPRES) 0.1 MG tablet Take 1 tablet (0.1 mg total) by mouth daily. 30 tablet 2   Cyanocobalamin (VITAMIN B12 PO)  Take 1 tablet by mouth daily.     furosemide (LASIX) 40 MG tablet TAKE ONE TABLET BY MOUTH ONE TIME DAILY 90 tablet 1   hydrALAZINE (APRESOLINE) 25 MG tablet Take 1 tablet (25 mg total) by mouth 3 (three) times daily. 270 tablet 1   memantine (NAMENDA) 10 MG tablet TAKE ONE TABLET BY MOUTH EVERY EVENING FOR 2 WEEKS THEN INCREASE TO TAKE ONE TABLET BY MOUTH TWICE A DAY 60 tablet 3   metoprolol succinate (TOPROL-XL) 100 MG 24 hr tablet TAKE ONE TABLET BY MOUTH ONE TIME DAILY WITH FOOD OR IMMEDIATELY FOLLOWING A MEAL 90 tablet 1   potassium chloride SA (KLOR-CON M) 20 MEQ tablet TAKE ONE-HALF TABLET BY MOUTH ONE TIME DAILY 45 tablet 1   Vitamin D, Ergocalciferol, (DRISDOL) 1.25 MG (50000 UNIT) CAPS capsule Take 1 capsule (50,000 Units total) by mouth every 7 (seven) days. 12 capsule 0   No facility-administered medications prior to visit.    Allergies  Allergen Reactions   Lisinopril Other (See Comments)    Renal insufficiency    ROS     Objective:    Physical Exam Constitutional:      General: She is not in acute distress.    Appearance: Normal appearance. She is not ill-appearing.  HENT:     Head: Normocephalic and atraumatic.     Right Ear: External ear normal.     Left Ear: External ear normal.  Eyes:     Extraocular Movements: Extraocular movements intact.     Pupils: Pupils are equal, round, and reactive to light.  Cardiovascular:     Rate and Rhythm: Normal rate and regular rhythm.     Heart sounds: Normal heart sounds. No murmur heard.    No gallop.  Pulmonary:     Effort: Pulmonary effort is normal. No respiratory distress.     Breath sounds: Normal breath sounds. No wheezing or rales.  Skin:    General: Skin is warm and dry.  Neurological:     Mental Status: She is alert and oriented to person, place, and time.  Psychiatric:        Judgment: Judgment normal.     There were no vitals taken for this visit. Wt Readings from Last 3 Encounters:  09/28/22 159 lb  3.2 oz (72.2 kg)  07/28/22 157 lb 1.9 oz (71.3 kg)  06/29/22 156 lb 12.8 oz (71.1 kg)       Assessment & Plan:  There are no diagnoses linked to this encounter.  I, Shehryar [place], personally preformed the services described in this documentation.  All medical record entries made by the scribe were at my direction and in my presence.  I have reviewed the chart and discharge instructions (if applicable) and agree that the record reflects my personal performance and is accurate and complete. 12/29/2022   I,Shehryar Baig,acting as a Education administrator for Nance Pear, NP.,have documented all relevant documentation on the behalf of Nance Pear, NP,as directed by  Nance Pear, NP while in the presence of Nance Pear, NP.   Shehryar [employer]

## 2023-01-02 ENCOUNTER — Other Ambulatory Visit: Payer: Self-pay | Admitting: Family

## 2023-01-02 ENCOUNTER — Encounter: Payer: Self-pay | Admitting: Family

## 2023-01-02 DIAGNOSIS — Z9189 Other specified personal risk factors, not elsewhere classified: Secondary | ICD-10-CM

## 2023-01-10 ENCOUNTER — Ambulatory Visit (INDEPENDENT_AMBULATORY_CARE_PROVIDER_SITE_OTHER): Payer: Medicare Other | Admitting: Family

## 2023-01-10 VITALS — BP 113/58 | HR 56 | Temp 97.5°F | Resp 16 | Wt 165.0 lb

## 2023-01-10 DIAGNOSIS — F03B0 Unspecified dementia, moderate, without behavioral disturbance, psychotic disturbance, mood disturbance, and anxiety: Secondary | ICD-10-CM | POA: Diagnosis not present

## 2023-01-10 DIAGNOSIS — E559 Vitamin D deficiency, unspecified: Secondary | ICD-10-CM

## 2023-01-10 DIAGNOSIS — I10 Essential (primary) hypertension: Secondary | ICD-10-CM | POA: Diagnosis not present

## 2023-01-10 NOTE — Progress Notes (Signed)
Subjective:   By signing my name below, I, Shehryar Baig, attest that this documentation has been prepared under the direction and in the presence of [name], NP. 01/10/2023   Patient ID: Sherry Perez, female    DOB: 02-Dec-1941, 82 y.o.   MRN: [id number]  Chief Complaint  Patient presents with   Follow-up    Here for Home health evaluation.     HPI Patient is in today for a follow up visit.   Home health: She is requesting home health services to monitor her while her daughter is out for work.   Medicines: Her daughter reports she is taking her medication normally and on time but occasionally forgets it.   Blood pressure: Her blood pressure is doing well during this visit.  BP Readings from Last 3 Encounters:  01/10/23 (!) 113/58  09/28/22 138/60  07/28/22 (!) 166/58   Pulse Readings from Last 3 Encounters:  01/10/23 (!) 56  09/28/22 (!) 48  07/28/22 (!) 51   Vitamin D: She is almost done with her 50000 unit 4 week vitamin D course.   Immunizations: She does not have the latest Covid-19 booster vaccine.    Past Medical History:  Diagnosis Date   Diabetes mellitus without complication (HCC)    HTN (hypertension)    Hyperlipidemia    Osteoporosis     Past Surgical History:  Procedure Laterality Date   ABDOMINAL HYSTERECTOMY     cataract surgery Left 02/04/2021    Family History  Problem Relation Age of Onset   Diabetes Mother    Diabetes Brother     Social History   Socioeconomic History   Marital status: Widowed    Spouse name: Not on file   Number of children: Not on file   Years of education: Not on file   Highest education level: Not on file  Occupational History   Not on file  Tobacco Use   Smoking status: Never   Smokeless tobacco: Never  Vaping Use   Vaping Use: Never used  Substance and Sexual Activity   Alcohol use: No    Alcohol/week: 0.0 standard drinks of alcohol   Drug use: Never   Sexual activity: Not on file  Other  Topics Concern   Not on file  Social History Narrative   4 children (3 daughters 1 son)- all local   7 grandchildren    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)   Widowed (husband passed 2016)   Lives with [name]   Enjoys cross word puzzle, adult coloring books, reads bible   Right handed    Social Determinants of Health   Financial Resource Strain: Low Risk  (06/30/2021)   Overall Financial Resource Strain (CARDIA)    Difficulty of Paying Living Expenses: Not hard at all  Food Insecurity: No [place] (06/30/2021)   Hunger Vital Sign    Worried About Running Out of Food in the Last Year: Never true    [place] in the Last Year: Never true  Transportation Needs: No Transportation Needs (06/30/2021)   PRAPARE - Hydrologist (Medical): No    Lack of Transportation (Non-Medical): No  Physical Activity: Inactive (06/30/2021)   Exercise Vital Sign    Days of Exercise per Week: 0 days    Minutes of Exercise per Session: 0 min  Stress: No Stress Concern Present (06/30/2021)   [place]  Feeling of Stress : Not at all  Social Connections: Moderately Isolated (06/30/2021)   Social Connection and Isolation Panel [NHANES]    Frequency of Communication with Friends and Family: More than three times a week    Frequency of Social Gatherings with Friends and Family: More than three times a week    Attends Religious Services: More than 4 times per year    Active Member of [employer] or Organizations: No    Attends Archivist Meetings: Never    Marital Status: Widowed  Intimate Partner Violence: Not At Risk (06/30/2021)   Humiliation, Afraid, Rape, and Kick questionnaire    Fear of Current or Ex-Partner: No    Emotionally Abused: No    Physically Abused: No    Sexually Abused: No    Outpatient Medications Prior to Visit  Medication Sig Dispense Refill   acetaminophen  (TYLENOL) 500 MG tablet Take 2 tablets (1,000 mg total) by mouth 2 (two) times daily. 30 tablet 0   alendronate (FOSAMAX) 70 MG tablet Take 1 tablet (70 mg total) by mouth every 7 (seven) days. Take with a full glass of water on an empty stomach. 12 tablet 4   amLODipine (NORVASC) 10 MG tablet TAKE ONE TABLET BY MOUTH ONE TIME DAILY 90 tablet 1   atorvastatin (LIPITOR) 80 MG tablet Take 1 tablet (80 mg total) by mouth daily. 90 tablet 1   Calcium Carbonate-Vit D-Min (CALTRATE 600+D PLUS MINERALS) 600-800 MG-UNIT TABS Take 1 tablet by mouth in the morning and at bedtime.     cloNIDine (CATAPRES) 0.1 MG tablet Take 1 tablet (0.1 mg total) by mouth daily. 30 tablet 2   Cyanocobalamin (VITAMIN B12 PO) Take 1 tablet by mouth daily.     furosemide (LASIX) 40 MG tablet TAKE ONE TABLET BY MOUTH ONE TIME DAILY 90 tablet 1   hydrALAZINE (APRESOLINE) 25 MG tablet Take 1 tablet (25 mg total) by mouth 3 (three) times daily. 270 tablet 1   memantine (NAMENDA) 10 MG tablet TAKE ONE TABLET BY MOUTH EVERY EVENING FOR 2 WEEKS THEN INCREASE TO TAKE ONE TABLET BY MOUTH TWICE A DAY 60 tablet 3   metoprolol succinate (TOPROL-XL) 100 MG 24 hr tablet TAKE ONE TABLET BY MOUTH ONE TIME DAILY WITH FOOD OR IMMEDIATELY FOLLOWING A MEAL 90 tablet 1   potassium chloride SA (KLOR-CON M) 20 MEQ tablet TAKE ONE-HALF TABLET BY MOUTH ONE TIME DAILY 45 tablet 1   Vitamin D, Ergocalciferol, (DRISDOL) 1.25 MG (50000 UNIT) CAPS capsule Take 1 capsule (50,000 Units total) by mouth every 7 (seven) days. 12 capsule 0   No facility-administered medications prior to visit.    Allergies  Allergen Reactions   Lisinopril Other (See Comments)    Renal insufficiency    ROS     Objective:    Physical Exam Constitutional:      General: She is not in acute distress.    Appearance: Normal appearance. She is not ill-appearing.  HENT:     Head: Normocephalic and atraumatic.     Right Ear: External ear normal.     Left Ear: External ear  normal.  Eyes:     Extraocular Movements: Extraocular movements intact.     Pupils: Pupils are equal, round, and reactive to light.  Cardiovascular:     Rate and Rhythm: Normal rate and regular rhythm.     Heart sounds: Normal heart sounds. No murmur heard.    No gallop.  Pulmonary:     Effort: Pulmonary  effort is normal. No respiratory distress.     Breath sounds: Normal breath sounds. No wheezing or rales.  Skin:    General: Skin is warm and dry.  Neurological:     Mental Status: She is alert and oriented to person, place, and time.  Psychiatric:        Judgment: Judgment normal.     BP (!) 113/58 (BP Location: Right Arm, Patient Position: Sitting, Cuff Size: Small)   Pulse (!) 56   Temp (!) 97.5 F (36.4 C) (Oral)   Resp 16   Wt 165 lb (74.8 kg)   SpO2 100%   BMI 24.37 kg/m  Wt Readings from Last 3 Encounters:  01/10/23 165 lb (74.8 kg)  09/28/22 159 lb 3.2 oz (72.2 kg)  07/28/22 157 lb 1.9 oz (71.3 kg)       Assessment & Plan:  Vitamin D deficiency Assessment & Plan: Completed 12 week course of vitamin D.  Will obtain follow up vit D level.   Orders: -     VITAMIN D 25 Hydroxy (Vit-D Deficiency, Fractures)  Moderate dementia, unspecified dementia type, unspecified whether behavioral, psychotic, or mood disturbance or anxiety [facility]) Assessment & Plan: Daughter is requesting home health aid to sit with patient.  Unfortunately, [facility] Medicare does not cover [place] [facility]ids. 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  She does not have any current PT/OT needs with home health.    Primary hypertension Assessment & Plan: BP is stable on amlodipine, clonidine, hydralazine, metoprolol. Continue same.     Encouraged daughter to get patient vaccinated for Covid 19 with latest booster at the pharmacy.   I, Nance Pear, NP, personally preformed the services described in this documentation.  All medical record entries made by the scribe were at my direction and  in my presence.  I have reviewed the chart and discharge instructions (if applicable) and agree that the record reflects my personal performance and is accurate and complete. 01/10/2023   I,Shehryar Baig,acting as a Education administrator for Nance Pear, NP.,have documented all relevant documentation on the behalf of Nance Pear, NP,as directed by  Nance Pear, NP while in the presence of Nance Pear, NP.   Nance Pear, NP

## 2023-01-10 NOTE — Assessment & Plan Note (Signed)
Daughter is requesting home health aid to sit with patient.  Unfortunately, [facility] Medicare does not cover [place] Aids. 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  She does not have any current PT/OT needs with home health.

## 2023-01-10 NOTE — Assessment & Plan Note (Signed)
Completed 12 week course of vitamin D.  Will obtain follow up vit D level.

## 2023-01-10 NOTE — Assessment & Plan Note (Signed)
BP is stable on amlodipine, clonidine, hydralazine, metoprolol. Continue same.

## 2023-01-17 ENCOUNTER — Ambulatory Visit: Payer: Medicare Other | Admitting: Podiatry

## 2023-01-17 ENCOUNTER — Encounter: Payer: Self-pay | Admitting: Podiatry

## 2023-01-17 DIAGNOSIS — B351 Tinea unguium: Secondary | ICD-10-CM | POA: Diagnosis not present

## 2023-01-17 DIAGNOSIS — R7303 Prediabetes: Secondary | ICD-10-CM

## 2023-01-17 DIAGNOSIS — M79674 Pain in right toe(s): Secondary | ICD-10-CM

## 2023-01-17 DIAGNOSIS — M79675 Pain in left toe(s): Secondary | ICD-10-CM | POA: Diagnosis not present

## 2023-01-17 NOTE — Progress Notes (Signed)
  Subjective:  Patient ID: Sherry Perez, female    DOB: 1941/01/04,   MRN: [id number]  Chief Complaint  Patient presents with   Nail Problem    Routine foot care, nail trim     82 y.o. female presents for concern of thickened elongated and painful nails that are difficult to trim. Requesting to have them trimmed today. Relates burning and tingling in their feet. Patient is borderline diabetic and last A1c was  Lab Results  Component Value Date   HGBA1C 5.9 09/28/2022   .   PCP:  [name], NP    . Denies any other pedal complaints. Denies n/v/f/c.   Past Medical History:  Diagnosis Date   Diabetes mellitus without complication (HCC)    HTN (hypertension)    Hyperlipidemia    Osteoporosis     Objective:  Physical Exam: Vascular: DP/PT pulses 2/4 bilateral. CFT <3 seconds. Absent hair growth on digits. Edema noted to bilateral lower extremities. Xerosis noted bilaterally.  Skin. No lacerations or abrasions bilateral feet. Nails 1-5 bilateral  are thickened discolored and elongated with subungual debris.  Musculoskeletal: MMT 5/5 bilateral lower extremities in DF, PF, Inversion and Eversion. Deceased ROM in DF of ankle joint.  Neurological: Sensation intact to light touch. Protective sensation intact bilateral.    Assessment:   1. Pain due to onychomycosis of toenails of both feet   2. Borderline diabetes      Plan:  Patient was evaluated and treated and all questions answered. -Discussed and educated patient on diabetic foot care, especially with  regards to the vascular, neurological and musculoskeletal systems.  -Stressed the importance of good glycemic control and the detriment of not  controlling glucose levels in relation to the foot. -Discussed supportive shoes at all times and checking feet regularly.  -Mechanically debrided all nails 1-5 bilateral using sterile nail nipper and filed with dremel without incident as courtesy today -Answered all patient  questions -Patient to return  as needed for foot care.  -Patient advised to call the office if any problems or questions arise in the meantime.   Lorenda Peck, DPM

## 2023-01-28 ENCOUNTER — Ambulatory Visit (INDEPENDENT_AMBULATORY_CARE_PROVIDER_SITE_OTHER): Payer: Medicare Other | Admitting: Family

## 2023-01-28 VITALS — BP 138/57 | HR 58 | Temp 98.6°F | Resp 16 | Wt 166.0 lb

## 2023-01-28 DIAGNOSIS — E538 Deficiency of other specified B group vitamins: Secondary | ICD-10-CM | POA: Diagnosis not present

## 2023-01-28 DIAGNOSIS — U071 COVID-19: Secondary | ICD-10-CM | POA: Diagnosis not present

## 2023-01-28 DIAGNOSIS — M810 Age-related osteoporosis without current pathological fracture: Secondary | ICD-10-CM | POA: Diagnosis not present

## 2023-01-28 DIAGNOSIS — R059 Cough, unspecified: Secondary | ICD-10-CM

## 2023-01-28 DIAGNOSIS — R7303 Prediabetes: Secondary | ICD-10-CM

## 2023-01-28 DIAGNOSIS — E559 Vitamin D deficiency, unspecified: Secondary | ICD-10-CM | POA: Diagnosis not present

## 2023-01-28 DIAGNOSIS — E782 Mixed hyperlipidemia: Secondary | ICD-10-CM | POA: Diagnosis not present

## 2023-01-28 DIAGNOSIS — N184 Chronic kidney disease, stage 4 (severe): Secondary | ICD-10-CM

## 2023-01-28 DIAGNOSIS — I10 Essential (primary) hypertension: Secondary | ICD-10-CM | POA: Diagnosis not present

## 2023-01-28 LAB — POC COVID19 BINAXNOW: SARS Coronavirus 2 Ag: POSITIVE — AB

## 2023-01-28 MED ORDER — MOLNUPIRAVIR 200 MG PO CAPS: 4.0000 | ORAL_CAPSULE | Freq: Two times a day (BID) | ORAL | 0 refills | Status: AC

## 2023-01-28 NOTE — Progress Notes (Signed)
Subjective:   By signing my name below, I, Sherry Perez, attest that this documentation has been prepared under the direction and in the presence of Sherry Alar, NP. 01/28/2023.   Patient ID: Sherry Perez, female    DOB: 09/02/1941, 82 y.o.   MRN: [id number]  Chief Complaint  Patient presents with   Hypertension    Here for follow up    Cough    Reports having a cough   Nasal Congestion    Reports having nasal congestion    HPI Patient is in today for an office visit. She is accompanied by her daughter, who is also a patient of mine.  Nasal congestion: She is currently feeling sick. She complains of congestion, cough, and rhinorrhea, with onset earlier today. We will test for COVID today.  Blood Pressure: Her blood pressure is stable today. She is taking metoprolol, amlodipine, and hydralazine. BP Readings from Last 3 Encounters:  01/28/23 (!) 138/57  01/10/23 (!) 113/58  09/28/22 138/60   Hyperlipidemia: She is compliant with atorvastatin. Lab Results  Component Value Date   CHOL 192 08/18/2021   HDL 88.60 08/18/2021   LDLCALC 91 08/18/2021   TRIG 62.0 08/18/2021   CHOLHDL 2 08/18/2021   Nephrology: She followed up with nephrology last Fall which reportedly went well. No changes were made.  Osteoporosis: DEXA is UTD, last performed 07/13/2022. Currently she is taking fosamax and a calcium supplement.  Denies having any fever, new muscle pain, joint pain, new moles, sinus pain, sore throat, chest pain, palpitations, SOB, wheezing, n/v/d, constipation, blood in stool, dysuria, frequency, hematuria, at this time.  Past Medical History:  Diagnosis Date   Diabetes mellitus without complication (HCC)    HTN (hypertension)    Hyperlipidemia    Osteoporosis     Past Surgical History:  Procedure Laterality Date   ABDOMINAL HYSTERECTOMY     cataract surgery Left 02/04/2021    Family History  Problem Relation Age of Onset   Diabetes Mother    Diabetes  Brother     Social History   Socioeconomic History   Marital status: Widowed    Spouse name: Not on file   Number of children: Not on file   Years of education: Not on file   Highest education level: Not on file  Occupational History   Not on file  Tobacco Use   Smoking status: Never   Smokeless tobacco: Never  Vaping Use   Vaping Use: Never used  Substance and Sexual Activity   Alcohol use: No    Alcohol/week: 0.0 standard drinks of alcohol   Drug use: Never   Sexual activity: Not on file  Other Topics Concern   Not on file  Social History Narrative   4 children (3 daughters 1 son)- all local   7 grandchildren    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)   Widowed (husband passed 2016)   Lives with Daughter Sherry Perez   Enjoys cross word puzzle, adult coloring books, reads bible   Right handed    Social Determinants of Health   Financial Resource Strain: Low Risk  (06/30/2021)   Overall Financial Resource Strain (CARDIA)    Difficulty of Paying Living Expenses: Not hard at all  Food Insecurity: No [place] (06/30/2021)   Hunger Vital Sign    Worried About Running Out of Food in the Last Year: Never true    [place] in the Last Year: Never true  Transportation Needs: No  Transportation Needs (06/30/2021)   PRAPARE - Hydrologist (Medical): No    Lack of Transportation (Non-Medical): No  Physical Activity: Inactive (06/30/2021)   Exercise Vital Sign    Days of Exercise per Week: 0 days    Minutes of Exercise per Session: 0 min  Stress: No Stress Concern Present (06/30/2021)   [place]    Feeling of Stress : Not at all  Social Connections: Moderately Isolated (06/30/2021)   Social Connection and Isolation Panel [NHANES]    Frequency of Communication with Friends and Family: More than three times a week    Frequency of Social Gatherings with Friends and Family: More  than three times a week    Attends Religious Services: More than 4 times per year    Active Member of [employer] or Organizations: No    Attends Archivist Meetings: Never    Marital Status: Widowed  Intimate Partner Violence: Not At Risk (06/30/2021)   Humiliation, Afraid, Rape, and Kick questionnaire    Fear of Current or Ex-Partner: No    Emotionally Abused: No    Physically Abused: No    Sexually Abused: No    Outpatient Medications Prior to Visit  Medication Sig Dispense Refill   acetaminophen (TYLENOL) 500 MG tablet Take 2 tablets (1,000 mg total) by mouth 2 (two) times daily. 30 tablet 0   alendronate (FOSAMAX) 70 MG tablet Take 1 tablet (70 mg total) by mouth every 7 (seven) days. Take with a full glass of water on an empty stomach. 12 tablet 4   amLODipine (NORVASC) 10 MG tablet TAKE ONE TABLET BY MOUTH ONE TIME DAILY 90 tablet 1   atorvastatin (LIPITOR) 80 MG tablet Take 1 tablet (80 mg total) by mouth daily. 90 tablet 1   Calcium Carbonate-Vit D-Min (CALTRATE 600+D PLUS MINERALS) 600-800 MG-UNIT TABS Take 1 tablet by mouth in the morning and at bedtime.     cloNIDine (CATAPRES) 0.1 MG tablet Take 1 tablet (0.1 mg total) by mouth daily. 30 tablet 2   Cyanocobalamin (VITAMIN B12 PO) Take 1 tablet by mouth daily.     furosemide (LASIX) 40 MG tablet TAKE ONE TABLET BY MOUTH ONE TIME DAILY 90 tablet 1   hydrALAZINE (APRESOLINE) 25 MG tablet Take 1 tablet (25 mg total) by mouth 3 (three) times daily. 270 tablet 1   memantine (NAMENDA) 10 MG tablet TAKE ONE TABLET BY MOUTH EVERY EVENING FOR 2 WEEKS THEN INCREASE TO TAKE ONE TABLET BY MOUTH TWICE A DAY 60 tablet 3   metoprolol succinate (TOPROL-XL) 100 MG 24 hr tablet TAKE ONE TABLET BY MOUTH ONE TIME DAILY WITH FOOD OR IMMEDIATELY FOLLOWING A MEAL 90 tablet 1   potassium chloride SA (KLOR-CON M) 20 MEQ tablet TAKE ONE-HALF TABLET BY MOUTH ONE TIME DAILY 45 tablet 1   No facility-administered medications prior to visit.     Allergies  Allergen Reactions   Lisinopril Other (See Comments)    Renal insufficiency    Review of Systems  Constitutional:  Negative for fever.  HENT:  Positive for congestion. Negative for sinus pain and sore throat.   Respiratory:  Positive for cough. Negative for shortness of breath and wheezing.   Cardiovascular:  Negative for chest pain and palpitations.  Gastrointestinal:  Negative for blood in stool, constipation, diarrhea, nausea and vomiting.  Genitourinary:  Negative for dysuria, frequency and hematuria.  Musculoskeletal:  Negative for joint pain and myalgias.  Objective:    Physical Exam Constitutional:      Appearance: Normal appearance.  HENT:     Head: Normocephalic and atraumatic.     Right Ear: Tympanic membrane, ear canal and external ear normal.     Left Ear: Tympanic membrane, ear canal and external ear normal.  Eyes:     Extraocular Movements: Extraocular movements intact.     Pupils: Pupils are equal, round, and reactive to light.  Cardiovascular:     Rate and Rhythm: Normal rate and regular rhythm.     Heart sounds: Murmur heard.     Systolic murmur is present with a grade of 2/6.     No gallop.  Pulmonary:     Effort: Pulmonary effort is normal. No respiratory distress.     Breath sounds: Normal breath sounds. No wheezing or rales.  Musculoskeletal:     Right lower leg: 1+ Edema present.     Left lower leg: 1+ Edema present.  Skin:    General: Skin is warm and dry.  Neurological:     General: No focal deficit present.     Mental Status: She is alert and oriented to person, place, and time.  Psychiatric:        Mood and Affect: Mood normal.        Behavior: Behavior normal.    BP (!) 138/57 (BP Location: Right Arm, Patient Position: Sitting, Cuff Size: Small)   Pulse (!) 58   Temp 98.6 F (37 C) (Oral)   Resp 16   Wt 166 lb (75.3 kg)   SpO2 100%   BMI 24.51 kg/m  Wt Readings from Last 3 Encounters:  01/28/23 166 lb (75.3  kg)  01/10/23 165 lb (74.8 kg)  09/28/22 159 lb 3.2 oz (72.2 kg)        Assessment & Plan:   Problem List Items Addressed This Visit       Unprioritized   Vitamin D deficiency    Completed 12 week course of vitamin D, will check follow up level.       Relevant Orders   VITAMIN D 25 Hydroxy (Vit-D Deficiency, Fractures)   Osteoporosis    On fosamax, and calcium supplement. Dexa up to date.       Hyperlipidemia    Lab Results  Component Value Date   CHOL 192 08/18/2021   HDL 88.60 08/18/2021   LDLCALC 91 08/18/2021   TRIG 62.0 08/18/2021   CHOLHDL 2 08/18/2021        Relevant Orders   Lipid panel   HTN (hypertension) - Primary    BP Readings from Last 3 Encounters:  01/28/23 (!) 138/57  01/10/23 (!) 113/58  09/28/22 138/60  BP stable on hydralazine, amlodipine, metoprolol.       COVID-19    New.  Did not have booster this fall.  On statin and has renal insufficiency- therefore will rx with molnupiravir. Advised of CDC guidelines for self isolation/ ending isolation.  Advised of safe practice guidelines. Symptom Tier reviewed.  Encouraged to monitor for any worsening symptoms; watch for increased shortness of breath, weakness, and signs of dehydration. Advised when to seek emergency care.  Instructed to rest and hydrate well.  Advised to leave the house during recommended isolation period, only if it is necessary to seek medical care       Relevant Medications   molnupiravir EUA (LAGEVRIO) 200 MG CAPS capsule   CKD (chronic kidney disease) stage 4, GFR 15-29 ml/min (HCC)  Lab Results  Component Value Date   CREATININE 1.60 (H) 07/28/2022  Stable, management per nephrology.       Relevant Orders   Comp Met (CMET)   Borderline diabetes    Lab Results  Component Value Date   HGBA1C 5.9 09/28/2022  Last A1C looked good.        Relevant Orders   Comp Met (CMET)   Urine Microalbumin w/creat. ratio   Other Visit Diagnoses     B12 deficiency        Relevant Orders   B12   Cough, unspecified type       Relevant Orders   POC COVID-19 (Completed)        Meds ordered this encounter  Medications   molnupiravir EUA (LAGEVRIO) 200 MG CAPS capsule    Sig: Take 4 capsules (800 mg total) by mouth 2 (two) times daily for 5 days.    Dispense:  40 capsule    Refill:  0    Order Specific Question:   Supervising Provider    Answer:   Penni Homans A [[id number]]    I, Nance Pear, NP, personally preformed the services described in this documentation.  All medical record entries made by the scribe were at my direction and in my presence.  I have reviewed the chart and discharge instructions (if applicable) and agree that the record reflects my personal performance and is accurate and complete. 01/28/2023.  I,Mathew Stumpf,acting as a Education administrator for [employer], NP.,have documented all relevant documentation on the behalf of Nance Pear, NP,as directed by  Nance Pear, NP while in the presence of Nance Pear, NP.   Nance Pear, NP

## 2023-01-28 NOTE — Assessment & Plan Note (Signed)
Lab Results  Component Value Date   CHOL 192 08/18/2021   HDL 88.60 08/18/2021   LDLCALC 91 08/18/2021   TRIG 62.0 08/18/2021   CHOLHDL 2 08/18/2021

## 2023-01-28 NOTE — Assessment & Plan Note (Signed)
Lab Results  Component Value Date   CREATININE 1.60 (H) 07/28/2022   Stable, management per nephrology.

## 2023-01-28 NOTE — Assessment & Plan Note (Signed)
On fosamax, and calcium supplement. Dexa up to date.

## 2023-01-28 NOTE — Assessment & Plan Note (Signed)
New.  Did not have booster this fall.  On statin and has renal insufficiency- therefore will rx with molnupiravir. Advised of CDC guidelines for self isolation/ ending isolation.  Advised of safe practice guidelines. Symptom Tier reviewed.  Encouraged to monitor for any worsening symptoms; watch for increased shortness of breath, weakness, and signs of dehydration. Advised when to seek emergency care.  Instructed to rest and hydrate well.  Advised to leave the house during recommended isolation period, only if it is necessary to seek medical care

## 2023-01-28 NOTE — Assessment & Plan Note (Signed)
Lab Results  Component Value Date   HGBA1C 5.9 09/28/2022   Last A1C looked good.

## 2023-01-28 NOTE — Assessment & Plan Note (Signed)
BP Readings from Last 3 Encounters:  01/28/23 (!) 138/57  01/10/23 (!) 113/58  09/28/22 138/60   BP stable on hydralazine, amlodipine, metoprolol.

## 2023-01-28 NOTE — Assessment & Plan Note (Signed)
Completed 12 week course of vitamin D, will check follow up level.

## 2023-01-29 ENCOUNTER — Telehealth: Payer: Self-pay | Admitting: Family

## 2023-01-29 DIAGNOSIS — E559 Vitamin D deficiency, unspecified: Secondary | ICD-10-CM

## 2023-01-29 LAB — LIPID PANEL
Cholesterol: 209 mg/dL — ABNORMAL HIGH (ref <200)
HDL: 90 mg/dL (ref >=50)
LDL Cholesterol (Calc): 104 mg/dL (calc) — ABNORMAL HIGH
Non-HDL Cholesterol (Calc): 119 mg/dL (calc) (ref <130)
Total CHOL/HDL Ratio: 2.3 (calc) (ref <5.0)
Triglycerides: 60 mg/dL (ref <150)

## 2023-01-29 LAB — COMPREHENSIVE METABOLIC PANEL
AG Ratio: 1.2 (calc) (ref 1.0–2.5)
ALT: 9 U/L (ref 6–29)
AST: 17 U/L (ref 10–35)
Albumin: 4 g/dL (ref 3.6–5.1)
Alkaline phosphatase (APISO): 69 U/L (ref 37–153)
BUN/Creatinine Ratio: 16 (calc) (ref 6–22)
BUN: 26 mg/dL — ABNORMAL HIGH (ref 7–25)
CO2: 24 mmol/L (ref 20–32)
Calcium: 9.1 mg/dL (ref 8.6–10.4)
Chloride: 103 mmol/L (ref 98–110)
Creat: 1.64 mg/dL — ABNORMAL HIGH (ref 0.60–0.95)
Globulin: 3.4 g/dL (calc) (ref 1.9–3.7)
Glucose, Bld: 117 mg/dL — ABNORMAL HIGH (ref 65–99)
Potassium: 4.5 mmol/L (ref 3.5–5.3)
Sodium: 141 mmol/L (ref 135–146)
Total Bilirubin: 0.4 mg/dL (ref 0.2–1.2)
Total Protein: 7.4 g/dL (ref 6.1–8.1)

## 2023-01-29 LAB — MICROALBUMIN / CREATININE URINE RATIO
Creatinine, Urine: 92 mg/dL (ref 20–275)
Microalb Creat Ratio: 28 mcg/mg creat (ref <30)
Microalb, Ur: 2.6 mg/dL

## 2023-01-29 LAB — VITAMIN D 25 HYDROXY (VIT D DEFICIENCY, FRACTURES): Vit D, 25-Hydroxy: 7 ng/mL — ABNORMAL LOW (ref 30–100)

## 2023-01-29 LAB — VITAMIN B12: Vitamin B-12: >2000 pg/mL — ABNORMAL HIGH (ref 200–1100)

## 2023-01-29 MED ORDER — VITAMIN D (ERGOCALCIFEROL) 1.25 MG (50000 UNIT) PO CAPS: 50000.0000 [IU] | ORAL_CAPSULE | ORAL | 0 refills | Status: DC

## 2023-01-29 NOTE — Telephone Encounter (Signed)
Vitamin D is super low.  Advise patient to begin vit D 50000 units once weekly for 12 weeks, then repeat vit D level (dx Vit D deficiency).

## 2023-01-31 NOTE — Telephone Encounter (Signed)
Patient's daughter advised of results and new rx. She will call back for repeat Vitamin D lab appointment

## 2023-02-04 ENCOUNTER — Encounter: Payer: Self-pay | Admitting: Family

## 2023-02-04 DIAGNOSIS — U071 COVID-19: Secondary | ICD-10-CM

## 2023-02-04 MED ORDER — BENZONATATE 100 MG PO CAPS: 100.0000 mg | ORAL_CAPSULE | Freq: Three times a day (TID) | ORAL | 0 refills | Status: DC | PRN

## 2023-02-04 NOTE — Telephone Encounter (Signed)
Please contact pt's daughter and let her know that I sent a cough medication to her pharmacy. I would like her to complete a chest x-ray downstairs this afternoon as well to make sure she doesn't have pneumonia going into the weekend.

## 2023-02-22 ENCOUNTER — Other Ambulatory Visit: Payer: Self-pay

## 2023-02-22 MED ORDER — FUROSEMIDE 40 MG PO TABS: 40.0000 mg | ORAL_TABLET | Freq: Every day | ORAL | 1 refills | Status: DC

## 2023-03-30 ENCOUNTER — Other Ambulatory Visit: Payer: Self-pay

## 2023-03-30 MED ORDER — AMLODIPINE BESYLATE 10 MG PO TABS: 10.0000 mg | ORAL_TABLET | Freq: Every day | ORAL | 1 refills | Status: DC

## 2023-05-09 ENCOUNTER — Other Ambulatory Visit: Payer: Self-pay | Admitting: Family

## 2023-06-13 ENCOUNTER — Ambulatory Visit (INDEPENDENT_AMBULATORY_CARE_PROVIDER_SITE_OTHER): Payer: Medicare Other | Admitting: Family

## 2023-06-13 ENCOUNTER — Ambulatory Visit (HOSPITAL_BASED_OUTPATIENT_CLINIC_OR_DEPARTMENT_OTHER)
Admission: RE | Admit: 2023-06-13 | Discharge: 2023-06-13 | Disposition: A | Discharge status: Home / Self Care | Payer: Medicare Other | Source: Ambulatory Visit | Attending: Family | Admitting: Family

## 2023-06-13 VITALS — BP 139/66 | HR 55 | Temp 98.3°F | Resp 16 | Wt 159.0 lb

## 2023-06-13 DIAGNOSIS — M25562 Pain in left knee: Secondary | ICD-10-CM | POA: Insufficient documentation

## 2023-06-13 DIAGNOSIS — E559 Vitamin D deficiency, unspecified: Secondary | ICD-10-CM

## 2023-06-13 DIAGNOSIS — M7989 Other specified soft tissue disorders: Secondary | ICD-10-CM | POA: Diagnosis not present

## 2023-06-13 DIAGNOSIS — M1712 Unilateral primary osteoarthritis, left knee: Secondary | ICD-10-CM | POA: Diagnosis not present

## 2023-06-13 DIAGNOSIS — N184 Chronic kidney disease, stage 4 (severe): Secondary | ICD-10-CM

## 2023-06-13 LAB — BASIC METABOLIC PANEL
BUN: 29 mg/dL — ABNORMAL HIGH (ref 6–23)
CO2: 25 mEq/L (ref 19–32)
Calcium: 9.3 mg/dL (ref 8.4–10.5)
Chloride: 103 mEq/L (ref 96–112)
Creatinine, Ser: 1.88 mg/dL — ABNORMAL HIGH (ref 0.40–1.20)
GFR: 24.3 mL/min — ABNORMAL LOW (ref >60.00)
Glucose, Bld: 81 mg/dL (ref 70–99)
Potassium: 3.8 mEq/L (ref 3.5–5.1)
Sodium: 139 mEq/L (ref 135–145)

## 2023-06-13 LAB — VITAMIN D 25 HYDROXY (VIT D DEFICIENCY, FRACTURES): VITD: 48.19 ng/mL (ref 30.00–100.00)

## 2023-06-13 NOTE — Assessment & Plan Note (Signed)
New. Check knee x-ray, refer to sports medicine, tylenol prn pain.

## 2023-06-13 NOTE — Progress Notes (Signed)
Subjective:     Patient ID: Sherry Perez, female    DOB: 03-09-1941, 82 y.o.   MRN: [id number]  Chief Complaint  Patient presents with   Knee Pain    Patient reports right knee pain    Knee Pain      History of Present Illness         Patient presents today with c/o left knee pain for several months.  Daughter has been rubbing "some cream on it." This seems to help some.    Daughter is requesting  a Nephrologist in [place] rather than [place] as it is closer to home.  Health Maintenance Due  Topic Date Due   DTaP/Tdap/Td (1 - Tdap) Never done   Zoster Vaccines- Shingrix (2 of 2) 08/05/2021   COVID-19 Vaccine (4 - 2023-24 season) 08/20/2022   HEMOGLOBIN A1C  03/30/2023   Medicare Annual Wellness (AWV)  07/06/2023    Past Medical History:  Diagnosis Date   Diabetes mellitus without complication (HCC)    HTN (hypertension)    Hyperlipidemia    Osteoporosis     Past Surgical History:  Procedure Laterality Date   ABDOMINAL HYSTERECTOMY     cataract surgery Left 02/04/2021    Family History  Problem Relation Age of Onset   Diabetes Mother    Diabetes Brother     Social History   Socioeconomic History   Marital status: Widowed    Spouse name: Not on file   Number of children: Not on file   Years of education: Not on file   Highest education level: 12th grade  Occupational History   Not on file  Tobacco Use   Smoking status: Never   Smokeless tobacco: Never  Vaping Use   Vaping Use: Never used  Substance and Sexual Activity   Alcohol use: No    Alcohol/week: 0.0 standard drinks of alcohol   Drug use: Never   Sexual activity: Not on file  Other Topics Concern   Not on file  Social History Narrative   4 children (3 daughters 1 son)- all local   7 grandchildren    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)   Widowed (husband passed 2016)   Lives with Daughter Sherry Perez   Enjoys cross word puzzle, adult coloring books, reads bible   Right handed    Social  Determinants of Health   Financial Resource Strain: High Risk (06/13/2023)   Overall Financial Resource Strain (CARDIA)    Difficulty of Paying Living Expenses: Hard  Food Insecurity: No Food Insecurity (06/13/2023)   Hunger Vital Sign    Worried About Running Out of Food in the Last Year: Never true    Ran Out of Food in the Last Year: Never true  Transportation Needs: No Transportation Needs (06/13/2023)   PRAPARE - Administrator, Civil Service (Medical): No    Lack of Transportation (Non-Medical): No  Physical Activity: Insufficiently Active (06/13/2023)   Exercise Vital Sign    Days of Exercise per Week: 3 days    Minutes of Exercise per Session: 30 min  Stress: No Stress Concern Present (06/13/2023)   [employer] of Occupational Health - Occupational Stress Questionnaire    Feeling of Stress : Not at all  Social Connections: Socially Isolated (06/13/2023)   Social Connection and Isolation Panel [NHANES]    Frequency of Communication with Friends and Family: Once a week    Frequency of Social Gatherings with Friends and Family: Once a week  Attends Religious Services: Never    Active Member of Clubs or Organizations: No    Attends Banker Meetings: Not on file    Marital Status: Widowed  Intimate Partner Violence: Not At Risk (06/30/2021)   Humiliation, Afraid, Rape, and Kick questionnaire    Fear of Current or Ex-Partner: No    Emotionally Abused: No    Physically Abused: No    Sexually Abused: No    Outpatient Medications Prior to Visit  Medication Sig Dispense Refill   acetaminophen (TYLENOL) 500 MG tablet Take 2 tablets (1,000 mg total) by mouth 2 (two) times daily. 30 tablet 0   alendronate (FOSAMAX) 70 MG tablet Take 1 tablet (70 mg total) by mouth every 7 (seven) days. Take with a full glass of water on an empty stomach. 12 tablet 4   amLODipine (NORVASC) 10 MG tablet Take 1 tablet (10 mg total) by mouth daily. 90 tablet 1   atorvastatin  (LIPITOR) 80 MG tablet TAKE 1 TABLET(80 MG) BY MOUTH DAILY 90 tablet 1   Calcium Carbonate-Vit D-Min (CALTRATE 600+D PLUS MINERALS) 600-800 MG-UNIT TABS Take 1 tablet by mouth in the morning and at bedtime.     Cyanocobalamin (VITAMIN B12 PO) Take 1 tablet by mouth daily.     furosemide (LASIX) 40 MG tablet Take 1 tablet (40 mg total) by mouth daily. 90 tablet 1   hydrALAZINE (APRESOLINE) 25 MG tablet Take 1 tablet (25 mg total) by mouth 3 (three) times daily. 270 tablet 1   memantine (NAMENDA) 10 MG tablet TAKE ONE TABLET BY MOUTH EVERY EVENING FOR 2 WEEKS THEN INCREASE TO TAKE ONE TABLET BY MOUTH TWICE A DAY 60 tablet 3   metoprolol succinate (TOPROL-XL) 100 MG 24 hr tablet TAKE ONE TABLET BY MOUTH ONE TIME DAILY WITH FOOD OR IMMEDIATELY FOLLOWING A MEAL 90 tablet 1   potassium chloride SA (KLOR-CON M) 20 MEQ tablet TAKE ONE-HALF TABLET BY MOUTH ONE TIME DAILY 45 tablet 1   Vitamin D, Ergocalciferol, (DRISDOL) 1.25 MG (50000 UNIT) CAPS capsule Take 1 capsule (50,000 Units total) by mouth every 7 (seven) days. 12 capsule 0   benzonatate (TESSALON) 100 MG capsule Take 1 capsule (100 mg total) by mouth 3 (three) times daily as needed. 20 capsule 0   cloNIDine (CATAPRES) 0.1 MG tablet Take 1 tablet (0.1 mg total) by mouth daily. 30 tablet 2   No facility-administered medications prior to visit.    Allergies  Allergen Reactions   Lisinopril Other (See Comments)    Renal insufficiency    ROS See HPI    Objective:    Physical Exam Constitutional:      Appearance: Normal appearance.  Musculoskeletal:     Comments: Left knee is without swelling or edema.  Able to flex/extend without pain or difficulty  Psychiatric:        Mood and Affect: Mood normal.        Behavior: Behavior normal.        Thought Content: Thought content normal.        Judgment: Judgment normal.      BP 139/66 (BP Location: Right Arm, Patient Position: Sitting, Cuff Size: Small)   Pulse (!) 55   Temp 98.3 F  (36.8 C) (Oral)   Resp 16   Wt 159 lb (72.1 kg)   SpO2 100%   BMI 23.48 kg/m  Wt Readings from Last 3 Encounters:  06/13/23 159 lb (72.1 kg)  01/28/23 166 lb (75.3 kg)  01/10/23 165  lb (74.8 kg)       Assessment & Plan:   Problem List Items Addressed This Visit       Unprioritized   Vitamin D deficiency   Relevant Orders   Vitamin D (25 hydroxy)   Left knee pain - Primary    New. Check knee x-ray, refer to sports medicine, tylenol prn pain.      Relevant Orders   DG Knee 1-2 Views Left   Ambulatory referral to Sports Medicine   CKD (chronic kidney disease) stage 4, GFR 15-29 ml/min (HCC)   Relevant Orders   Ambulatory referral to Nephrology   Basic Metabolic Panel (BMET)    I have discontinued Tykera L. Geng's benzonatate. I am also having her maintain her acetaminophen, Cyanocobalamin (VITAMIN B12 PO), metoprolol succinate, Caltrate 600+D Plus Minerals, alendronate, potassium chloride SA, hydrALAZINE, cloNIDine, memantine, Vitamin D (Ergocalciferol), furosemide, amLODipine, and atorvastatin.  No orders of the defined types were placed in this encounter.

## 2023-06-13 NOTE — Patient Instructions (Signed)
You may use tylenol as needed for pain.

## 2023-06-24 ENCOUNTER — Other Ambulatory Visit: Payer: Self-pay

## 2023-06-24 MED ORDER — METOPROLOL SUCCINATE ER 100 MG PO TB24: ORAL_TABLET | ORAL | 1 refills | Status: DC

## 2023-06-28 ENCOUNTER — Ambulatory Visit: Payer: Medicare Other | Admitting: Sports Medicine

## 2023-07-07 ENCOUNTER — Ambulatory Visit: Payer: Medicare Other | Admitting: *Deleted

## 2023-07-07 DIAGNOSIS — Z0000 Encounter for general adult medical examination without abnormal findings: Secondary | ICD-10-CM

## 2023-07-07 NOTE — Progress Notes (Signed)
Subjective:   Sherry Perez is a 82 y.o. female who presents for Medicare Annual (Subsequent) preventive examination.  Visit Complete: Virtual  I connected with  Sherry Perez on 07/07/23 by a audio enabled telemedicine application and verified that I am speaking with the correct person using two identifiers.  Patient Location: Home  Provider Location: Office/Clinic  I discussed the limitations of evaluation and management by telemedicine. The patient expressed understanding and agreed to proceed.  Patient Medicare AWV questionnaire was completed by the patient on 06/30/23; I have confirmed that all information answered by patient is correct and no changes since this date.  Review of Systems     Cardiac Risk Factors include: advanced age (>70men, >38 women);diabetes mellitus;dyslipidemia;hypertension     Objective:    Per patient no change in vitals since last visit, unable to obtain new vitals due to telehealth visit      07/07/2023    2:26 PM 07/28/2022    3:29 PM 07/05/2022    9:42 AM 12/07/2021    9:11 AM 09/13/2021    1:55 PM 07/30/2021    4:00 PM 07/30/2021   10:01 AM  Advanced Directives  Does Patient Have a Medical Advance Directive? Yes Yes Yes Yes No  Yes  Type of Estate agent of [employer] Power of [place];Living will Healthcare Power of [place];Living will Healthcare Power of [place];Living will  Healthcare Power of [place];Living will   Does patient want to make changes to medical advance directive? No - Patient declined No - Patient declined    No - Guardian declined   Copy of Healthcare Power of Attorney in Chart? No - copy requested No - copy requested No - copy requested   No - copy requested     Current Medications (verified) Outpatient Encounter Medications as of 07/07/2023  Medication Sig   acetaminophen (TYLENOL) 500 MG tablet Take 2 tablets (1,000 mg total) by mouth 2 (two) times daily.   alendronate (FOSAMAX) 70 MG  tablet Take 1 tablet (70 mg total) by mouth every 7 (seven) days. Take with a full glass of water on an empty stomach.   amLODipine (NORVASC) 10 MG tablet Take 1 tablet (10 mg total) by mouth daily.   atorvastatin (LIPITOR) 80 MG tablet TAKE 1 TABLET(80 MG) BY MOUTH DAILY   Calcium Carbonate-Vit D-Min (CALTRATE 600+D PLUS MINERALS) 600-800 MG-UNIT TABS Take 1 tablet by mouth in the morning and at bedtime.   cloNIDine (CATAPRES) 0.1 MG tablet Take 1 tablet (0.1 mg total) by mouth daily.   Cyanocobalamin (VITAMIN B12 PO) Take 1 tablet by mouth daily.   furosemide (LASIX) 40 MG tablet Take 1 tablet (40 mg total) by mouth daily.   hydrALAZINE (APRESOLINE) 25 MG tablet Take 1 tablet (25 mg total) by mouth 3 (three) times daily.   memantine (NAMENDA) 10 MG tablet TAKE ONE TABLET BY MOUTH EVERY EVENING FOR 2 WEEKS THEN INCREASE TO TAKE ONE TABLET BY MOUTH TWICE A DAY   metoprolol succinate (TOPROL-XL) 100 MG 24 hr tablet Take with or immediately following a meal.   potassium chloride SA (KLOR-CON M) 20 MEQ tablet TAKE ONE-HALF TABLET BY MOUTH ONE TIME DAILY   Vitamin D, Ergocalciferol, (DRISDOL) 1.25 MG (50000 UNIT) CAPS capsule Take 1 capsule (50,000 Units total) by mouth every 7 (seven) days.   No facility-administered encounter medications on file as of 07/07/2023.    Allergies (verified) Lisinopril   History: Past Medical History:  Diagnosis Date   Arthritis  Diabetes mellitus without complication (HCC)    HTN (hypertension)    Hyperlipidemia    Osteoporosis    Past Surgical History:  Procedure Laterality Date   ABDOMINAL HYSTERECTOMY     cataract surgery Left 02/04/2021   EYE SURGERY  03/04/2021   Cateract on left eye   Family History  Problem Relation Age of Onset   Diabetes Mother    Diabetes Brother    Social History   Socioeconomic History   Marital status: Widowed    Spouse name: Not on file   Number of children: Not on file   Years of education: Not on file    Highest education level: 12th grade  Occupational History   Not on file  Tobacco Use   Smoking status: Never   Smokeless tobacco: Never  Vaping Use   Vaping status: Never Used  Substance and Sexual Activity   Alcohol use: No    Alcohol/week: 0.0 standard drinks of alcohol   Drug use: Never   Sexual activity: Not Currently  Other Topics Concern   Not on file  Social History Narrative   4 children (3 daughters 1 son)- all local   7 grandchildren    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)   Widowed (husband passed 2016)   Lives with Daughter Sherry Perez   Enjoys cross word puzzle, adult coloring books, reads bible   Right handed    Social Determinants of Health   Financial Resource Strain: Low Risk  (06/30/2023)   Overall Financial Resource Strain (CARDIA)    Difficulty of Paying Living Expenses: Not hard at all  Recent Concern: Financial Resource Strain - High Risk (06/13/2023)   Overall Financial Resource Strain (CARDIA)    Difficulty of Paying Living Expenses: Hard  Food Insecurity: No Food Insecurity (06/30/2023)   Hunger Vital Sign    Worried About Running Out of Food in the Last Year: Never true    Ran Out of Food in the Last Year: Never true  Transportation Needs: No Transportation Needs (06/30/2023)   PRAPARE - Administrator, Civil Service (Medical): No    Lack of Transportation (Non-Medical): No  Physical Activity: Insufficiently Active (06/30/2023)   Exercise Vital Sign    Days of Exercise per Week: 3 days    Minutes of Exercise per Session: 20 min  Stress: No Stress Concern Present (06/30/2023)   [employer] of Occupational Health - Occupational Stress Questionnaire    Feeling of Stress : Not at all  Social Connections: Socially Isolated (06/30/2023)   Social Connection and Isolation Panel [NHANES]    Frequency of Communication with Friends and Family: Twice a week    Frequency of Social Gatherings with Friends and Family: Twice a week    Attends  Religious Services: Never    Database administrator or Organizations: No    Attends Banker Meetings: Never    Marital Status: Widowed    Tobacco Counseling Counseling given: Not Answered   Clinical Intake:  Pre-visit preparation completed: Yes  Pain : No/denies pain  Nutritional Risks: None Diabetes: Yes CBG done?: No Did pt. bring in CBG monitor from home?: No  How often do you need to have someone help you when you read instructions, pamphlets, or other written materials from your doctor or pharmacy?: 4 - Often  Interpreter Needed?: No  Information entered by :: Donne Anon, CMA   Activities of Daily Living    06/30/2023    9:23 AM  In your present state of health, do you have any difficulty performing the following activities:  Hearing? 0  Vision? 0  Difficulty concentrating or making decisions? 1  Walking or climbing stairs? 0  Dressing or bathing? 0  Doing errands, shopping? 1  Preparing Food and eating ? N  Using the Toilet? N  In the past six months, have you accidently leaked urine? N  Do you have problems with loss of bowel control? N  Managing your Medications? Y  Managing your Finances? Y  Housekeeping or managing your Housekeeping? N    Patient Care Team: Sandford Craze, NP as PCP - General (Internal Medicine) Annie Sable, MD as Consulting Physician (Nephrology) Elwyn Reach (Neurology)  Indicate any recent Medical Services you may have received from other than [facility] providers in the past year (date may be approximate).     Assessment:   This is a routine wellness examination for Sherry Perez.  Hearing/Vision screen No results found.  Dietary issues and exercise activities discussed:     Goals Addressed   None    Depression Screen    07/07/2023    2:27 PM 07/05/2022    9:42 AM 06/30/2021    9:56 AM 02/10/2021   10:27 AM 11/06/2019    3:21 PM 02/24/2018    3:24 PM 02/22/2017    9:54 AM  PHQ 2/9 Scores   PHQ - 2 Score 0 0 0 0 0 1 1  PHQ- 9 Score      1 3    Fall Risk    06/30/2023    9:23 AM 07/05/2022    9:42 AM 12/07/2021    9:09 AM 06/30/2021    9:56 AM 06/12/2021    9:02 AM  Fall Risk   Falls in the past year? 0 0 0 0 0  Number falls in past yr:  0 0 0 0  Injury with Fall?  0 0 0 0  Risk for fall due to :  No Fall Risks     Follow up  Falls evaluation completed  Falls prevention discussed     MEDICARE RISK AT HOME:  Medicare Risk at Home - 07/07/23 1425     Any stairs in or around the home? Yes    If so, are there any without handrails? No    Home free of loose throw rugs in walkways, pet beds, electrical cords, etc? Yes    Adequate lighting in your home to reduce risk of falls? Yes    Life alert? No    Use of a cane, walker or w/c? No    Grab bars in the bathroom? No    Shower chair or bench in shower? No    Elevated toilet seat or a handicapped toilet? No             TIMED UP AND GO:  Was the test performed?  No    Cognitive Function:    11/06/2019    3:21 PM 07/10/2019    3:06 PM  MMSE - Mini Mental State Exam  Not completed: Refused   Orientation to time  3  Orientation to Place  3  Registration  3  Attention/ Calculation  4  Recall  0  Language- name 2 objects  2  Language- repeat  1  Language- follow 3 step command  3  Language- read & follow direction  1  Write a sentence  1  Copy design  1  Total score  22  06/12/2021   11:00 AM  Montreal Cognitive Assessment   Visuospatial/ Executive (0/5) 1  Naming (0/3) 0  Attention: Read list of digits (0/2) 1  Attention: Read list of letters (0/1) 0  Attention: Serial 7 subtraction starting at 100 (0/3) 0  Language: Repeat phrase (0/2) 1  Language : Fluency (0/1) 0  Abstraction (0/2) 0  Delayed Recall (0/5) 0  Orientation (0/6) 2  Total 5  Adjusted Score (based on education) 6      07/07/2023    2:27 PM 07/05/2022    9:47 AM  6CIT Screen  What Year? 4 points 0 points  What month? 3  points 0 points  What time? 0 points 0 points  Count back from 20 0 points 4 points  Months in reverse 4 points 4 points  Repeat phrase 10 points 10 points  Total Score 21 points 18 points    Immunizations Immunization History  Administered Date(s) Administered   Fluad Quad(high Dose 65+) 10/09/2019, 10/10/2020, 08/18/2021, 09/28/2022   Influenza Split 10/23/2012, 09/18/2014, 10/08/2015   Influenza, High Dose Seasonal PF 09/13/2016, 10/28/2017, 09/01/2018   Influenza-Unspecified 10/15/2009, 10/23/2010, 09/27/2011   Moderna SARS-COV2 Booster Vaccination 01/12/2021   PFIZER Comirnaty(Gray Top)Covid-19 Tri-Sucrose Vaccine 06/10/2021   PFIZER(Purple Top)SARS-COV-2 Vaccination 02/15/2020, 03/08/2020, 01/12/2021   Pneumococcal Conjugate-13 05/29/2018   Pneumococcal Polysaccharide-23 03/30/2022   Pneumococcal-Unspecified 09/27/2011   Zoster Recombinant(Shingrix) 06/10/2021    TDAP status: Due, Education has been provided regarding the importance of this vaccine. Advised may receive this vaccine at local pharmacy or Health Dept. Aware to provide a copy of the vaccination record if obtained from local pharmacy or Health Dept. Verbalized acceptance and understanding.  Flu Vaccine status: Up to date  Pneumococcal vaccine status: Up to date  Covid-19 vaccine status: Information provided on how to obtain vaccines.   Qualifies for Shingles Vaccine? Yes   Zostavax completed No   Shingrix Completed?: No.    Education has been provided regarding the importance of this vaccine. Patient has been advised to call insurance company to determine out of pocket expense if they have not yet received this vaccine. Advised may also receive vaccine at local pharmacy or Health Dept. Verbalized acceptance and understanding.  Screening Tests Health Maintenance  Topic Date Due   DTaP/Tdap/Td (1 - Tdap) Never done   Zoster Vaccines- Shingrix (2 of 2) 08/05/2021   COVID-19 Vaccine (4 - 2023-24 season)  08/20/2022   HEMOGLOBIN A1C  03/30/2023   Medicare Annual Wellness (AWV)  07/06/2023   INFLUENZA VACCINE  07/21/2023   Diabetic kidney evaluation - Urine ACR  01/29/2024   Diabetic kidney evaluation - eGFR measurement  06/12/2024   Pneumonia Vaccine 71+ Years old  Completed   DEXA SCAN  Completed   HPV VACCINES  Aged Out   FOOT EXAM  Discontinued   OPHTHALMOLOGY EXAM  Discontinued    Health Maintenance  Health Maintenance Due  Topic Date Due   DTaP/Tdap/Td (1 - Tdap) Never done   Zoster Vaccines- Shingrix (2 of 2) 08/05/2021   COVID-19 Vaccine (4 - 2023-24 season) 08/20/2022   HEMOGLOBIN A1C  03/30/2023   Medicare Annual Wellness (AWV)  07/06/2023    Colorectal cancer screening: No longer required.   Mammogram status: No longer required due to pt preference.  Bone Density status: Completed 07/13/22. Results reflect: Bone density results: OSTEOPOROSIS. Repeat every 2 years.  Lung Cancer Screening: (Low Dose CT Chest recommended if Age 39-80 years, 20 pack-year currently smoking OR have quit w/in 15years.) does  not qualify.   Additional Screening:  Hepatitis C Screening: does not qualify  Vision Screening: Recommended annual ophthalmology exams for early detection of glaucoma and other disorders of the eye. Is the patient up to date with their annual eye exam?  Yes  Who is the provider or what is the name of the office in which the patient attends annual eye exams? [facility] If pt is not established with a provider, would they like to be referred to a provider to establish care? No .   Dental Screening: Recommended annual dental exams for proper oral hygiene  Diabetic Foot Exam: Diabetic Foot Exam: Overdue, Pt has been advised about the importance in completing this exam. Pt is scheduled for diabetic foot exam on N/a.  Community Resource Referral / Chronic Care Management: CRR required this visit?  No   CCM required this visit?  No     Plan:     I have personally  reviewed and noted the following in the patient's chart:   Medical and social history Use of alcohol, tobacco or illicit drugs  Current medications and supplements including opioid prescriptions. Patient is not currently taking opioid prescriptions. Functional ability and status Nutritional status Physical activity Advanced directives List of other physicians Hospitalizations, surgeries, and ER visits in previous 12 months Vitals Screenings to include cognitive, depression, and falls Referrals and appointments  In addition, I have reviewed and discussed with patient certain preventive protocols, quality metrics, and best practice recommendations. A written personalized care plan for preventive services as well as general preventive health recommendations were provided to patient.     Donne Anon, CMA   07/07/2023   After Visit Summary: (MyChart) Due to this being a telephonic visit, the after visit summary with patients personalized plan was offered to patient via MyChart   Nurse Notes: None

## 2023-07-07 NOTE — Patient Instructions (Signed)
Sherry Perez , Thank you for taking time to come for your Medicare Wellness Visit. I appreciate your ongoing commitment to your health goals. Please review the following plan we discussed and let me know if I can assist you in the future.    This is a list of the screening recommended for you and due dates:  Health Maintenance  Topic Date Due   DTaP/Tdap/Td vaccine (1 - Tdap) Never done   Zoster (Shingles) Vaccine (2 of 2) 08/05/2021   COVID-19 Vaccine (4 - 2023-24 season) 08/20/2022   Hemoglobin A1C  03/30/2023   Flu Shot  07/21/2023   Yearly kidney health urinalysis for diabetes  01/29/2024   Yearly kidney function blood test for diabetes  06/12/2024   Medicare Annual Wellness Visit  07/06/2024   Pneumonia Vaccine  Completed   DEXA scan (bone density measurement)  Completed   HPV Vaccine  Aged Out   Complete foot exam   Discontinued   Eye exam for diabetics  Discontinued    Next appointment: Follow up in one year for your annual wellness visit.   Preventive Care 80 Years and Older, Female Preventive care refers to lifestyle choices and visits with your health care provider that can promote health and wellness. What does preventive care include? A yearly physical exam. This is also called an annual well check. Dental exams once or twice a year. Routine eye exams. Ask your health care provider how often you should have your eyes checked. Personal lifestyle choices, including: Daily care of your teeth and gums. Regular physical activity. Eating a healthy diet. Avoiding tobacco and drug use. Limiting alcohol use. Practicing safe sex. Taking low-dose aspirin every day. Taking vitamin and mineral supplements as recommended by your health care provider. What happens during an annual well check? The services and screenings done by your health care provider during your annual well check will depend on your age, overall health, lifestyle risk factors, and family history of  disease. Counseling  Your health care provider may ask you questions about your: Alcohol use. Tobacco use. Drug use. Emotional well-being. Home and relationship well-being. Sexual activity. Eating habits. History of falls. Memory and ability to understand (cognition). Work and work Astronomer. Reproductive health. Screening  You may have the following tests or measurements: Height, weight, and BMI. Blood pressure. Lipid and cholesterol levels. These may be checked every 5 years, or more frequently if you are over 24 years old. Skin check. Lung cancer screening. You may have this screening every year starting at age 44 if you have a 30-pack-year history of smoking and currently smoke or have quit within the past 15 years. Fecal occult blood test (FOBT) of the stool. You may have this test every year starting at age 7. Flexible sigmoidoscopy or colonoscopy. You may have a sigmoidoscopy every 5 years or a colonoscopy every 10 years starting at age 42. Hepatitis C blood test. Hepatitis B blood test. Sexually transmitted disease (STD) testing. Diabetes screening. This is done by checking your blood sugar (glucose) after you have not eaten for a while (fasting). You may have this done every 1-3 years. Bone density scan. This is done to screen for osteoporosis. You may have this done starting at age 51. Mammogram. This may be done every 1-2 years. Talk to your health care provider about how often you should have regular mammograms. Talk with your health care provider about your test results, treatment options, and if necessary, the need for more tests. Vaccines  Your  health care provider may recommend certain vaccines, such as: Influenza vaccine. This is recommended every year. Tetanus, diphtheria, and acellular pertussis (Tdap, Td) vaccine. You may need a Td booster every 10 years. Zoster vaccine. You may need this after age 48. Pneumococcal 13-valent conjugate (PCV13) vaccine. One  dose is recommended after age 79. Pneumococcal polysaccharide (PPSV23) vaccine. One dose is recommended after age 76. Talk to your health care provider about which screenings and vaccines you need and how often you need them. This information is not intended to replace advice given to you by your health care provider. Make sure you discuss any questions you have with your health care provider. Document Released: 01/02/2016 Document Revised: 08/25/2016 Document Reviewed: 10/07/2015 Elsevier Interactive Patient Education  2017 [employer].  Fall Prevention in the Home Falls can cause injuries. They can happen to people of all ages. There are many things you can do to make your home safe and to help prevent falls. What can I do on the outside of my home? Regularly fix the edges of walkways and driveways and fix any cracks. Remove anything that might make you trip as you walk through a door, such as a raised step or threshold. Trim any bushes or trees on the path to your home. Use bright outdoor lighting. Clear any walking paths of anything that might make someone trip, such as rocks or tools. Regularly check to see if handrails are loose or broken. Make sure that both sides of any steps have handrails. Any raised decks and porches should have guardrails on the edges. Have any leaves, snow, or ice cleared regularly. Use sand or salt on walking paths during winter. Clean up any spills in your garage right away. This includes oil or grease spills. What can I do in the bathroom? Use night lights. Install grab bars by the toilet and in the tub and shower. Do not use towel bars as grab bars. Use non-skid mats or decals in the tub or shower. If you need to sit down in the shower, use a plastic, non-slip stool. Keep the floor dry. Clean up any water that spills on the floor as soon as it happens. Remove soap buildup in the tub or shower regularly. Attach bath mats securely with double-sided  non-slip rug tape. Do not have throw rugs and other things on the floor that can make you trip. What can I do in the bedroom? Use night lights. Make sure that you have a light by your bed that is easy to reach. Do not use any sheets or blankets that are too big for your bed. They should not hang down onto the floor. Have a firm chair that has side arms. You can use this for support while you get dressed. Do not have throw rugs and other things on the floor that can make you trip. What can I do in the kitchen? Clean up any spills right away. Avoid walking on wet floors. Keep items that you use a lot in easy-to-reach places. If you need to reach something above you, use a strong step stool that has a grab bar. Keep electrical cords out of the way. Do not use floor polish or wax that makes floors slippery. If you must use wax, use non-skid floor wax. Do not have throw rugs and other things on the floor that can make you trip. What can I do with my stairs? Do not leave any items on the stairs. Make sure that there are handrails  on both sides of the stairs and use them. Fix handrails that are broken or loose. Make sure that handrails are as long as the stairways. Check any carpeting to make sure that it is firmly attached to the stairs. Fix any carpet that is loose or worn. Avoid having throw rugs at the top or bottom of the stairs. If you do have throw rugs, attach them to the floor with carpet tape. Make sure that you have a light switch at the top of the stairs and the bottom of the stairs. If you do not have them, ask someone to add them for you. What else can I do to help prevent falls? Wear shoes that: Do not have high heels. Have rubber bottoms. Are comfortable and fit you well. Are closed at the toe. Do not wear sandals. If you use a stepladder: Make sure that it is fully opened. Do not climb a closed stepladder. Make sure that both sides of the stepladder are locked into place. Ask  someone to hold it for you, if possible. Clearly mark and make sure that you can see: Any grab bars or handrails. First and last steps. Where the edge of each step is. Use tools that help you move around (mobility aids) if they are needed. These include: Canes. Walkers. Scooters. Crutches. Turn on the lights when you go into a dark area. Replace any light bulbs as soon as they burn out. Set up your furniture so you have a clear path. Avoid moving your furniture around. If any of your floors are uneven, fix them. If there are any pets around you, be aware of where they are. Review your medicines with your doctor. Some medicines can make you feel dizzy. This can increase your chance of falling. Ask your doctor what other things that you can do to help prevent falls. This information is not intended to replace advice given to you by your health care provider. Make sure you discuss any questions you have with your health care provider. Document Released: 10/02/2009 Document Revised: 05/13/2016 Document Reviewed: 01/10/2015 Elsevier Interactive Patient Education  2017 [employer].

## 2023-07-11 ENCOUNTER — Ambulatory Visit: Payer: Medicare Other | Admitting: Family Medicine

## 2023-07-25 ENCOUNTER — Ambulatory Visit: Payer: Medicare Other | Admitting: Licensed Clinical Social Worker

## 2023-07-25 NOTE — Patient Outreach (Signed)
  Care Coordination  Initial Visit Note   07/25/2023 Name: Sherry Perez MRN: [id number] DOB: October 23, 1941  Sherry Perez is a 81 y.o. year old female who sees Sherry Craze, NP for primary care. I spoke with  Sherry Perez by phone today.  What matters to the patients health and wellness today?    Patient's caregiver reports no concerns or needs with health and wellness related to physical or mental heath.  Patient was accompanied by her daughter Sherry Perez who provided information during this encounter.Marland Kitchen   SDOH assessments and interventions completed: recently completed at AWV, patient and daughter reports no changes No  Care Coordination Interventions:  Yes, provided   Follow up plan: No further intervention required.   Encounter Outcome:  Pt. Visit Completed   Sammuel Hines, LCSW Social Work Care Coordination  [facility] Emmie Niemann [employer] [phone]

## 2023-07-25 NOTE — Patient Instructions (Signed)
Social Work Visit Information  Thank you for taking time to visit with me today. Please don't hesitate to contact me if I can be of assistance to you.    Care Coordination provides support specific to your health needs that extend beyond exceptional routine office care you already receive from your primary care doctor.   There is no cost to you for Care Coordination.  The Care Coordination team is made up of the following team members: Registered Nurse Care Manager: disease management, health education, care coordination and complex case management Clinical Social Work: Complex Care Coordination including coordination of level of care needs, mental and behavioral health assessment and recommendations, and connection to long-term mental health support Clinical Pharmacist: medication management, assistance and disease management Community Resource Care Guides: community resource connections  Please call [phone] if you would like to schedule a phone appointment with one of the team members.    If you or anyone you know are experiencing a Mental Health or Behavioral Health Crisis or need someone to talk to, please call the Suicide and Crisis Lifeline: 988 call the [place] National Suicide Prevention Lifeline: 1-800-273-8255 or TTY: 1-800-799-4 TTY (1-800-799-4889) to talk to a trained counselor call 1-800-273-TALK (toll free, 24 hour hotline) go to [facility] [address], [place] ([phone])   Patient verbalizes understanding of instructions and care plan provided today and agrees to view in MyChart. Active MyChart status and patient understanding of how to access instructions and care plan via MyChart confirmed with patient.      Deborah Moore, LCSW Social Work Care Coordination   /[facility] [phone]     

## 2023-08-01 ENCOUNTER — Ambulatory Visit (INDEPENDENT_AMBULATORY_CARE_PROVIDER_SITE_OTHER): Payer: Medicare Other | Admitting: Family Medicine

## 2023-08-01 VITALS — BP 138/57 | Wt 159.0 lb

## 2023-08-01 DIAGNOSIS — M25562 Pain in left knee: Secondary | ICD-10-CM | POA: Diagnosis not present

## 2023-08-01 DIAGNOSIS — G8929 Other chronic pain: Secondary | ICD-10-CM | POA: Diagnosis not present

## 2023-08-01 NOTE — Patient Instructions (Signed)
Your pain is due to arthritis. These are the different medications you can take for this: Tylenol 500mg  1-2 tabs three times a day for pain. Voltaren gel topically up to four times a day may also help with pain. Cortisone injections are an option if the pain is severe - just call me if this is the case. Straight leg raises, knee extensions 3 sets of 10 once a day (add ankle weight if these become too easy). Follow up with me as needed.

## 2023-08-02 ENCOUNTER — Encounter: Payer: Self-pay | Admitting: Family Medicine

## 2023-08-02 NOTE — Progress Notes (Signed)
PCP: Sandford Craze, NP  Subjective:   HPI: Patient is a 82 y.o. female here for left knee pain.  Patient reports she's had left knee pain off and on for several months. Overall not bad today.  Notices it more at night. She hasn't been doing much aside from topical medication which helps. No catching, locking, giving out.  Past Medical History:  Diagnosis Date   Arthritis    Diabetes mellitus without complication (HCC)    HTN (hypertension)    Hyperlipidemia    Osteoporosis     Current Outpatient Medications on File Prior to Visit  Medication Sig Dispense Refill   acetaminophen (TYLENOL) 500 MG tablet Take 2 tablets (1,000 mg total) by mouth 2 (two) times daily. 30 tablet 0   alendronate (FOSAMAX) 70 MG tablet Take 1 tablet (70 mg total) by mouth every 7 (seven) days. Take with a full glass of water on an empty stomach. 12 tablet 4   amLODipine (NORVASC) 10 MG tablet Take 1 tablet (10 mg total) by mouth daily. 90 tablet 1   atorvastatin (LIPITOR) 80 MG tablet TAKE 1 TABLET(80 MG) BY MOUTH DAILY 90 tablet 1   Calcium Carbonate-Vit D-Min (CALTRATE 600+D PLUS MINERALS) 600-800 MG-UNIT TABS Take 1 tablet by mouth in the morning and at bedtime.     cloNIDine (CATAPRES) 0.1 MG tablet Take 1 tablet (0.1 mg total) by mouth daily. 30 tablet 2   Cyanocobalamin (VITAMIN B12 PO) Take 1 tablet by mouth daily.     furosemide (LASIX) 40 MG tablet Take 1 tablet (40 mg total) by mouth daily. 90 tablet 1   hydrALAZINE (APRESOLINE) 25 MG tablet Take 1 tablet (25 mg total) by mouth 3 (three) times daily. 270 tablet 1   memantine (NAMENDA) 10 MG tablet TAKE ONE TABLET BY MOUTH EVERY EVENING FOR 2 WEEKS THEN INCREASE TO TAKE ONE TABLET BY MOUTH TWICE A DAY 60 tablet 3   metoprolol succinate (TOPROL-XL) 100 MG 24 hr tablet Take with or immediately following a meal. 90 tablet 1   potassium chloride SA (KLOR-CON M) 20 MEQ tablet TAKE ONE-HALF TABLET BY MOUTH ONE TIME DAILY 45 tablet 1   Vitamin D,  Ergocalciferol, (DRISDOL) 1.25 MG (50000 UNIT) CAPS capsule Take 1 capsule (50,000 Units total) by mouth every 7 (seven) days. 12 capsule 0   No current facility-administered medications on file prior to visit.    Past Surgical History:  Procedure Laterality Date   ABDOMINAL HYSTERECTOMY     cataract surgery Left 02/04/2021   EYE SURGERY  03/04/2021   Cateract on left eye    Allergies  Allergen Reactions   Lisinopril Other (See Comments)    Renal insufficiency    BP (!) 138/57   Wt 159 lb (72.1 kg)   BMI 23.48 kg/m       No data to display              No data to display              Objective:  Physical Exam:  Gen: NAD, comfortable in exam room  Left knee: Valgus deformity.  No ecchymoses, swelling. No TTP currently. FROM with normal strength. Negative ant/post drawers. Negative valgus/varus testing. Negative lachman.  Negative mcmurrays, apleys.  NV intact distally.    Assessment & Plan:  1. Left knee pain - 2/2 arthritis.  Seen previously for this 5 years ago.  She declined injection today or prescription medication.  She would like to continue with topical  medication - discussed voltaren gel.  Tylenol as needed also.  Home exercise program reviewed.  F/u with [place] if this is bothering her enough that she would like an injection.

## 2023-08-10 ENCOUNTER — Other Ambulatory Visit: Payer: Self-pay

## 2023-08-10 MED ORDER — FUROSEMIDE 40 MG PO TABS: 40.0000 mg | ORAL_TABLET | Freq: Every day | ORAL | 1 refills | Status: DC

## 2023-08-31 ENCOUNTER — Other Ambulatory Visit: Payer: Self-pay | Admitting: Family

## 2023-10-04 ENCOUNTER — Other Ambulatory Visit: Payer: Self-pay | Admitting: Family

## 2023-10-05 DIAGNOSIS — I129 Hypertensive chronic kidney disease with stage 1 through stage 4 chronic kidney disease, or unspecified chronic kidney disease: Secondary | ICD-10-CM | POA: Diagnosis not present

## 2023-10-05 DIAGNOSIS — N184 Chronic kidney disease, stage 4 (severe): Secondary | ICD-10-CM | POA: Diagnosis not present

## 2023-10-07 ENCOUNTER — Other Ambulatory Visit: Payer: Self-pay | Admitting: Family

## 2023-10-10 MED ORDER — CLONIDINE HCL 0.1 MG PO TABS: 0.1000 mg | ORAL_TABLET | Freq: Every day | ORAL | 0 refills | Status: DC

## 2023-11-03 ENCOUNTER — Other Ambulatory Visit: Payer: Self-pay | Admitting: Family

## 2023-11-11 ENCOUNTER — Ambulatory Visit (INDEPENDENT_AMBULATORY_CARE_PROVIDER_SITE_OTHER): Payer: Medicare Other | Admitting: Family

## 2023-11-11 DIAGNOSIS — Z23 Encounter for immunization: Secondary | ICD-10-CM | POA: Diagnosis not present

## 2023-11-28 NOTE — Progress Notes (Signed)
 Pt presented for flu shot.

## 2023-12-27 ENCOUNTER — Other Ambulatory Visit: Payer: Self-pay | Admitting: Family

## 2023-12-31 ENCOUNTER — Other Ambulatory Visit: Payer: Self-pay | Admitting: Family

## 2024-01-02 ENCOUNTER — Other Ambulatory Visit: Payer: Self-pay | Admitting: Family

## 2024-01-03 ENCOUNTER — Other Ambulatory Visit: Payer: Self-pay | Admitting: Family

## 2024-02-10 ENCOUNTER — Ambulatory Visit: Payer: Medicare Other | Admitting: Family

## 2024-02-10 VITALS — BP 127/59 | HR 52 | Temp 98.6°F | Resp 16 | Ht 69.0 in | Wt 159.0 lb

## 2024-02-10 DIAGNOSIS — R82998 Other abnormal findings in urine: Secondary | ICD-10-CM | POA: Diagnosis not present

## 2024-02-10 DIAGNOSIS — I10 Essential (primary) hypertension: Secondary | ICD-10-CM

## 2024-02-10 DIAGNOSIS — R350 Frequency of micturition: Secondary | ICD-10-CM | POA: Diagnosis not present

## 2024-02-10 LAB — POC URINALSYSI DIPSTICK (AUTOMATED)
Bilirubin, UA: NEGATIVE
Blood, UA: NEGATIVE
Glucose, UA: NEGATIVE
Ketones, UA: NEGATIVE
Nitrite, UA: POSITIVE
Protein, UA: NEGATIVE
Spec Grav, UA: 1.01 (ref 1.010–1.025)
Urobilinogen, UA: 0.2 U/dL
pH, UA: 5 (ref 5.0–8.0)

## 2024-02-10 MED ORDER — CEPHALEXIN 500 MG PO CAPS
500.0000 mg | ORAL_CAPSULE | Freq: Two times a day (BID) | ORAL | 0 refills | Status: DC
Start: 2024-02-10 — End: 2024-07-09

## 2024-02-10 NOTE — Patient Instructions (Signed)
VISIT SUMMARY:  Today, we discussed your increased urinary frequency and reviewed your current medications. We also talked about your blood pressure management and the need to monitor your kidney function.  YOUR PLAN:  -URINARY TRACT INFECTION: A urinary tract infection (UTI) is an infection in any part of your urinary system. You will start an antibiotic to treat the infection. Your daughter has been informed about this plan.  -HYPERTENSION: Hypertension, or high blood pressure, is when the force of the blood against your artery walls is too high. Your blood pressure is stable with your current medications, so you should continue taking Amlodipine, Clonidine, Hydralazine, and Metoprolol as prescribed.  -RENAL FUNCTION: Renal function refers to how well your kidneys are working. Because of your age and the medications you are taking for blood pressure, we need to monitor your kidney function. A blood test has been ordered to check this.  INSTRUCTIONS:  Please follow up with the blood test to assess your kidney function as soon as possible. Continue taking your current medications for blood pressure as prescribed. If you experience any new symptoms or if your urinary symptoms do not improve, contact our office.

## 2024-02-10 NOTE — Assessment & Plan Note (Addendum)
Moderate leuks in UA.  Will rx with keflex.  Send urine for culture.

## 2024-02-10 NOTE — Assessment & Plan Note (Signed)
BP stable, will update bmet.  Continue clonidine, amlodipine, apresoline, toprol xl.

## 2024-02-10 NOTE — Progress Notes (Signed)
Subjective:     Patient ID: Sherry Perez, female    DOB: 1941/07/26, 83 y.o.   MRN: [id number]  Chief Complaint  Patient presents with   Urinary Frequency    Complains of urinary frequency    Urinary Frequency  Associated symptoms include frequency.    Discussed the use of AI scribe software for clinical note transcription with the patient, who gave verbal consent to proceed.  History of Present Illness     Sherry Perez is an 83 year old female who presents with urinary symptoms. She is accompanied by her daughter.  She experiences increased urinary frequency without dysuria. No urinary incontinence or accidents are reported.  Her current medications include amlodipine, clonidine, hydralazine, and metoprolol.         Health Maintenance Due  Topic Date Due   DTaP/Tdap/Td (1 - Tdap) Never done   Zoster Vaccines- Shingrix (2 of 2) 08/05/2021   HEMOGLOBIN A1C  03/30/2023   COVID-19 Vaccine (6 - 2024-25 season) 08/21/2023   Diabetic kidney evaluation - Urine ACR  01/29/2024    Past Medical History:  Diagnosis Date   Arthritis    Diabetes mellitus without complication (HCC)    HTN (hypertension)    Hyperlipidemia    Osteoporosis     Past Surgical History:  Procedure Laterality Date   ABDOMINAL HYSTERECTOMY     cataract surgery Left 02/04/2021   EYE SURGERY  03/04/2021   Cateract on left eye    Family History  Problem Relation Age of Onset   Diabetes Mother    Diabetes Brother     Social History   Socioeconomic History   Marital status: Widowed    Spouse name: Not on file   Number of children: Not on file   Years of education: Not on file   Highest education level: 10th grade  Occupational History   Not on file  Tobacco Use   Smoking status: Never   Smokeless tobacco: Never  Vaping Use   Vaping status: Never Used  Substance and Sexual Activity   Alcohol use: No    Alcohol/week: 0.0 standard drinks of alcohol   Drug use: Never   Sexual  activity: Not Currently  Other Topics Concern   Not on file  Social History Narrative   4 children (3 daughters 1 son)- all local   7 grandchildren    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)   Widowed (husband passed 2016)   Lives with Daughter Sherry Perez   Enjoys cross word puzzle, adult coloring books, reads bible   Right handed    Social Drivers of Health   Financial Resource Strain: Low Risk  (02/10/2024)   Overall Financial Resource Strain (CARDIA)    Difficulty of Paying Living Expenses: Not hard at all  Food Insecurity: No Food Insecurity (02/10/2024)   Hunger Vital Sign    Worried About Running Out of Food in the Last Year: Never true    Ran Out of Food in the Last Year: Never true  Transportation Needs: No Transportation Needs (02/10/2024)   PRAPARE - Administrator, Civil Service (Medical): No    Lack of Transportation (Non-Medical): No  Physical Activity: Insufficiently Active (02/10/2024)   Exercise Vital Sign    Days of Exercise per Week: 4 days    Minutes of Exercise per Session: 30 min  Stress: No Stress Concern Present (02/10/2024)   [employer] of Occupational Health - Occupational Stress Questionnaire    Feeling  of Stress : Not at all  Social Connections: Socially Isolated (02/10/2024)   Social Connection and Isolation Panel [NHANES]    Frequency of Communication with Friends and Family: Once a week    Frequency of Social Gatherings with Friends and Family: Never    Attends Religious Services: 1 to 4 times per year    Active Member of [employer] or Organizations: No    Attends Banker Meetings: Never    Marital Status: Widowed  Intimate Partner Violence: Not At Risk (07/07/2023)   Humiliation, Afraid, Rape, and Kick questionnaire    Fear of Current or Ex-Partner: No    Emotionally Abused: No    Physically Abused: No    Sexually Abused: No    Outpatient Medications Prior to Visit  Medication Sig Dispense Refill   acetaminophen (TYLENOL)  500 MG tablet Take 2 tablets (1,000 mg total) by mouth 2 (two) times daily. 30 tablet 0   alendronate (FOSAMAX) 70 MG tablet Take 1 tablet (70 mg total) by mouth every 7 (seven) days. Take with a full glass of water on an empty stomach. 12 tablet 4   amLODipine (NORVASC) 10 MG tablet TAKE 1 TABLET(10 MG) BY MOUTH DAILY 90 tablet 0   atorvastatin (LIPITOR) 80 MG tablet Take 1 tablet (80 mg total) by mouth daily. 90 tablet 0   Calcium Carbonate-Vit D-Min (CALTRATE 600+D PLUS MINERALS) 600-800 MG-UNIT TABS Take 1 tablet by mouth in the morning and at bedtime.     cloNIDine (CATAPRES) 0.1 MG tablet TAKE 1 TABLET(0.1 MG) BY MOUTH DAILY 90 tablet 0   Cyanocobalamin (VITAMIN B12 PO) Take 1 tablet by mouth daily.     furosemide (LASIX) 40 MG tablet TAKE 1 TABLET(40 MG) BY MOUTH DAILY 90 tablet 1   hydrALAZINE (APRESOLINE) 25 MG tablet TAKE 1 TABLET(25 MG) BY MOUTH THREE TIMES DAILY 270 tablet 0   memantine (NAMENDA) 10 MG tablet Take 1 tablet (10 mg total) by mouth 2 (two) times daily. 180 tablet 0   metoprolol succinate (TOPROL-XL) 100 MG 24 hr tablet TAKE 1 TABLET BY MOUTH DAILY WITH OR FOLLOWING A MEAL 90 tablet 1   Potassium Chloride ER 20 MEQ TBCR TAKE 1/2 TABLET(10 MEQ) BY MOUTH DAILY 45 tablet 0   Vitamin D, Ergocalciferol, (DRISDOL) 1.25 MG (50000 UNIT) CAPS capsule Take 1 capsule (50,000 Units total) by mouth every 7 (seven) days. 12 capsule 0   No facility-administered medications prior to visit.    Allergies  Allergen Reactions   Lisinopril Other (See Comments)    Renal insufficiency    Review of Systems  Genitourinary:  Positive for frequency.       Objective:    Physical Exam Constitutional:      General: She is not in acute distress.    Appearance: Normal appearance. She is well-developed.  HENT:     Head: Normocephalic and atraumatic.     Right Ear: External ear normal.     Left Ear: External ear normal.  Eyes:     General: No scleral icterus. Neck:     Thyroid: No  thyromegaly.  Cardiovascular:     Rate and Rhythm: Normal rate and regular rhythm.     Heart sounds: Normal heart sounds. No murmur heard. Pulmonary:     Effort: Pulmonary effort is normal. No respiratory distress.     Breath sounds: Normal breath sounds. No wheezing.  Musculoskeletal:     Cervical back: Neck supple.  Skin:    General: Skin is  warm and dry.  Neurological:     Mental Status: She is alert and oriented to person, place, and time.  Psychiatric:        Mood and Affect: Mood normal.        Behavior: Behavior normal.        Thought Content: Thought content normal.        Judgment: Judgment normal.      BP (!) 127/59 (BP Location: Right Arm, Patient Position: Sitting, Cuff Size: Normal)   Pulse (!) 52   Temp 98.6 F (37 C) (Oral)   Resp 16   Ht 5\' 9"  (1.753 m)   Wt 159 lb (72.1 kg)   SpO2 100%   BMI 23.48 kg/m  Wt Readings from Last 3 Encounters:  02/10/24 159 lb (72.1 kg)  08/01/23 159 lb (72.1 kg)  06/13/23 159 lb (72.1 kg)       Assessment & Plan:   Problem List Items Addressed This Visit       Unprioritized   Urinary frequency - Primary   Moderate leuks in UA.  Will rx with keflex.  Send urine for culture.       Relevant Medications   cephALEXin (KEFLEX) 500 MG capsule   Other Relevant Orders   POCT Urinalysis Dipstick (Automated) (Completed)   Urine Culture   HTN (hypertension)   BP stable, will update bmet.  Continue clonidine, amlodipine, apresoline, toprol xl.       Relevant Orders   Basic Metabolic Panel (BMET)   Other Visit Diagnoses       Urine leukocytes increased       Relevant Orders   Urine Culture       I am having Nkechi L. Picking start on cephALEXin. I am also having her maintain her acetaminophen, Cyanocobalamin (VITAMIN B12 PO), Caltrate 600+D Plus Minerals, alendronate, Vitamin D (Ergocalciferol), memantine, atorvastatin, metoprolol succinate, amLODipine, hydrALAZINE, Potassium Chloride ER, furosemide, and  cloNIDine.  Meds ordered this encounter  Medications   cephALEXin (KEFLEX) 500 MG capsule    Sig: Take 1 capsule (500 mg total) by mouth 2 (two) times daily.    Dispense:  10 capsule    Refill:  0    Supervising Provider:   Danise Edge A [[id number]]

## 2024-02-11 LAB — BASIC METABOLIC PANEL
BUN/Creatinine Ratio: 13 (calc) (ref 6–22)
BUN: 25 mg/dL (ref 7–25)
CO2: 27 mmol/L (ref 20–32)
Calcium: 9 mg/dL (ref 8.6–10.4)
Chloride: 102 mmol/L (ref 98–110)
Creat: 1.89 mg/dL — ABNORMAL HIGH (ref 0.60–0.95)
Glucose, Bld: 83 mg/dL (ref 65–99)
Potassium: 4.3 mmol/L (ref 3.5–5.3)
Sodium: 140 mmol/L (ref 135–146)

## 2024-02-12 LAB — URINE CULTURE
MICRO NUMBER:: 16113273
SPECIMEN QUALITY:: ADEQUATE

## 2024-02-13 ENCOUNTER — Encounter: Payer: Self-pay | Admitting: Family

## 2024-02-26 ENCOUNTER — Other Ambulatory Visit: Payer: Self-pay | Admitting: Family

## 2024-04-10 ENCOUNTER — Other Ambulatory Visit: Payer: Self-pay | Admitting: Family

## 2024-04-11 ENCOUNTER — Telehealth: Payer: Self-pay | Admitting: *Deleted

## 2024-04-11 NOTE — Telephone Encounter (Unsigned)
 Copied from CRM [id number]. Topic: Clinical - Medication Question >> Apr 11, 2024 11:25 AM Allyne Areola wrote: Reason for CRM: [facility] pharmacy is calling for clarification on a prescription they received for Potassium Chloride  ER 20 MEQ TBCR. She stated that on the prescription it states to break the tablet in half and take 10 MEQ instead of the 20 MEQ but this medication cannot be broken. They do have a 10 MEQ but would need a new prescription for that. Best call back number [phone] prompt for pharmacy no ext. Ask for Hilarie Lovely.

## 2024-04-12 MED ORDER — POTASSIUM CHLORIDE CRYS ER 10 MEQ PO TBCR: 10.0000 meq | EXTENDED_RELEASE_TABLET | Freq: Every day | ORAL | 1 refills | Status: AC

## 2024-04-12 NOTE — Addendum Note (Signed)
 Addended by: Dorrene Gaucher on: 04/12/2024 01:54 PM   Modules accepted: Orders

## 2024-04-12 NOTE — Telephone Encounter (Signed)
 Please advise daughter that I sent the 10 mEQ tabs instead.  She will take 1 full tab once daily.

## 2024-04-13 NOTE — Telephone Encounter (Signed)
 Patient S daughter notified of this information

## 2024-05-02 ENCOUNTER — Other Ambulatory Visit: Payer: Self-pay | Admitting: Family

## 2024-05-08 ENCOUNTER — Encounter: Payer: Self-pay | Admitting: Family

## 2024-05-10 ENCOUNTER — Other Ambulatory Visit: Payer: Self-pay

## 2024-05-10 MED ORDER — FUROSEMIDE 40 MG PO TABS: 40.0000 mg | ORAL_TABLET | Freq: Every day | ORAL | 1 refills | Status: AC

## 2024-06-11 ENCOUNTER — Other Ambulatory Visit: Payer: Self-pay | Admitting: Family

## 2024-06-20 DIAGNOSIS — B351 Tinea unguium: Secondary | ICD-10-CM | POA: Diagnosis not present

## 2024-06-20 DIAGNOSIS — M79675 Pain in left toe(s): Secondary | ICD-10-CM | POA: Diagnosis not present

## 2024-06-20 DIAGNOSIS — M2042 Other hammer toe(s) (acquired), left foot: Secondary | ICD-10-CM | POA: Diagnosis not present

## 2024-06-20 DIAGNOSIS — M79674 Pain in right toe(s): Secondary | ICD-10-CM | POA: Diagnosis not present

## 2024-06-20 DIAGNOSIS — M2041 Other hammer toe(s) (acquired), right foot: Secondary | ICD-10-CM | POA: Diagnosis not present

## 2024-06-21 ENCOUNTER — Telehealth: Payer: Self-pay | Admitting: Family

## 2024-06-21 NOTE — Telephone Encounter (Signed)
 Copied from CRM [id number]. Topic: Medicare AWV >> Jun 21, 2024 11:22 AM Nathanel DEL wrote: Reason for CRM: LVM 06/21/2024 to schedule AWV. Please schedule Virtual or Telehealth visits ONLY.   Nathanel Paschal; Care Guide Ambulatory Clinical Support [place] [facility] Direct Dial: [phone]

## 2024-07-09 ENCOUNTER — Telehealth: Payer: Self-pay | Admitting: *Deleted

## 2024-07-09 ENCOUNTER — Ambulatory Visit (INDEPENDENT_AMBULATORY_CARE_PROVIDER_SITE_OTHER): Admitting: *Deleted

## 2024-07-09 VITALS — Ht 69.0 in | Wt 159.0 lb

## 2024-07-09 DIAGNOSIS — Z0000 Encounter for general adult medical examination without abnormal findings: Secondary | ICD-10-CM

## 2024-07-09 MED ORDER — ALENDRONATE SODIUM 70 MG PO TABS: 70.0000 mg | ORAL_TABLET | ORAL | 1 refills | Status: DC

## 2024-07-09 NOTE — Patient Instructions (Signed)
 Ms. Glatt , Thank you for taking time out of your busy schedule to complete your Annual Wellness Visit with me. I enjoyed our conversation and look forward to speaking with you again next year. I, as well as your care team,  appreciate your ongoing commitment to your health goals. Please review the following plan we discussed and let me know if I can assist you in the future. Your Game plan/ To Do List   Referrals: If you haven't heard from the office you've been referred to, please reach out to them at the phone provided.  Let us  know if the contact info for adult day care and caregiver relief do not work out.  Follow up Visits: Next Medicare AWV with our clinical staff: 07/10/25 1pm    Next Office Visit with your provider: 08/10/24 2:20pm  Clinician Recommendations:  Aim for 30 minutes of exercise or brisk walking, 6-8 glasses of water, and 5 servings of fruits and vegetables each day.       This is a list of the screening recommended for you and due dates:  Health Maintenance  Topic Date Due   DTaP/Tdap/Td vaccine (1 - Tdap) Never done   Zoster (Shingles) Vaccine (2 of 2) 08/28/2021   Hemoglobin A1C  03/30/2023   COVID-19 Vaccine (6 - 2024-25 season) 08/21/2023   Yearly kidney health urinalysis for diabetes  01/29/2024   Medicare Annual Wellness Visit  07/06/2024   Flu Shot  07/20/2024   Yearly kidney function blood test for diabetes  02/09/2025   Pneumococcal Vaccine for age over 59  Completed   DEXA scan (bone density measurement)  Completed   Hepatitis B Vaccine  Aged Out   HPV Vaccine  Aged Out   Meningitis B Vaccine  Aged Out   Complete foot exam   Discontinued   Eye exam for diabetics  Discontinued    Advanced directives: (Copy Requested) Please bring a copy of your health care power of attorney and living will to the office to be added to your chart at your convenience. You can mail to [facility] [address] [place], [place] [address] or email to  [email or web address] Advance Care Planning is important because it:  [x]  Makes sure you receive the medical care that is consistent with your values, goals, and preferences  [x]  It provides guidance to your family and loved ones and reduces their decisional burden about whether or not they are making the right decisions based on your wishes.  Follow the link provided in your after visit summary or read over the paperwork we have mailed to you to help you started getting your Advance Directives in place. If you need assistance in completing these, please reach out to us  so that we can help you!  See attachments for Preventive Care and Fall Prevention Tips.

## 2024-07-09 NOTE — Telephone Encounter (Signed)
 Pt had AWV today.  Daughter notes that Namenda  was causing nausea and pt no longer takes it. I added it to med intolerances.  Also provided daughter with contacts for [employer] and Senior resources of [place] to see if she can get any caregiver relief. She will let us  know if this isn't successful or they have further needs.

## 2024-07-09 NOTE — Progress Notes (Signed)
 Please attest this visit in the absence of patient primary care provider.    Subjective:   Sherry Perez is a 83 y.o. who presents for a Medicare Wellness preventive visit.  As a reminder, Annual Wellness Visits don't include a physical exam, and some assessments may be limited, especially if this visit is performed virtually. We may recommend an in-person follow-up visit with your provider if needed.  Visit Complete: Virtual I connected with  Sherry Perez on 07/09/24 by a audio enabled telemedicine application and verified that I am speaking with the correct person using two identifiers.  Patient Location: Home  Provider Location: Office/Clinic  I discussed the limitations of evaluation and management by telemedicine. The patient expressed understanding and agreed to proceed.  Vital Signs: Because this visit was a virtual/telehealth visit, some criteria may be missing or patient reported. Any vitals not documented were not able to be obtained and vitals that have been documented are patient reported.  VideoDeclined- This patient declined Librarian, academic. Therefore the visit was completed with audio only.  Persons Participating in Visit: daughter, Sherry Perez  and patient was present during visit.  AWV Questionnaire: Yes: Patient Medicare AWV questionnaire was completed by the patient on 07/08/24; I have confirmed that all information answered by patient is correct and no changes since this date.  Cardiac Risk Factors include: advanced age (>54men, >80 women);diabetes mellitus;hypertension;Other (see comment), Risk factor comments: CKD     Objective:    Today's Vitals   07/09/24 1303  Weight: 159 lb (72.1 kg)  Height: 5' 9 (1.753 m)   Body mass index is 23.48 kg/m.     07/09/2024    1:44 PM 07/07/2023    2:26 PM 07/28/2022    3:29 PM 07/05/2022    9:42 AM 12/07/2021    9:11 AM 09/13/2021    1:55 PM 07/30/2021    4:00 PM  Advanced Directives   Does Patient Have a Medical Advance Directive? Yes Yes Yes Yes Yes No   Type of Sales promotion account executive of [employer] Power of [place];Living will Healthcare Power of [place];Living will Healthcare Power of [place];Living will  Healthcare Power of [place];Living will  Does patient want to make changes to medical advance directive? No - Patient declined No - Patient declined No - Patient declined    No - Guardian declined  Copy of Healthcare Power of Attorney in Chart? No - copy requested No - copy requested No - copy requested No - copy requested   No - copy requested    Current Medications (verified) Outpatient Encounter Medications as of 07/09/2024  Medication Sig   acetaminophen  (TYLENOL ) 500 MG tablet Take 2 tablets (1,000 mg total) by mouth 2 (two) times daily.   alendronate  (FOSAMAX ) 70 MG tablet Take 1 tablet (70 mg total) by mouth every 7 (seven) days. Take with a full glass of water on an empty stomach.   amLODipine  (NORVASC ) 10 MG tablet TAKE 1 TABLET(10 MG) BY MOUTH DAILY   atorvastatin  (LIPITOR) 80 MG tablet TAKE 1 TABLET(80 MG) BY MOUTH DAILY   cloNIDine  (CATAPRES ) 0.1 MG tablet TAKE 1 TABLET(0.1 MG) BY MOUTH DAILY   furosemide  (LASIX ) 40 MG tablet Take 1 tablet (40 mg total) by mouth daily.   hydrALAZINE  (APRESOLINE ) 25 MG tablet TAKE 1 TABLET(25 MG) BY MOUTH THREE TIMES DAILY   metoprolol  succinate (TOPROL -XL) 100 MG 24 hr tablet TAKE 1 TABLET BY MOUTH DAILY WITH OR FOLLOWING A  MEAL   potassium chloride  (KLOR-CON  M) 10 MEQ tablet Take 1 tablet (10 mEq total) by mouth daily.   [DISCONTINUED] alendronate  (FOSAMAX ) 70 MG tablet Take 1 tablet (70 mg total) by mouth every 7 (seven) days. Take with a full glass of water on an empty stomach.   Calcium  Carbonate-Vit D-Min (CALTRATE 600+D PLUS MINERALS) 600-800 MG-UNIT TABS Take 1 tablet by mouth in the morning and at bedtime. (Patient not taking: Reported on 07/09/2024)   Cyanocobalamin   (VITAMIN B12 PO) Take 1 tablet by mouth daily. (Patient not taking: Reported on 07/09/2024)   memantine  (NAMENDA ) 10 MG tablet TAKE 1 TABLET(10 MG) BY MOUTH TWICE DAILY   [DISCONTINUED] cephALEXin  (KEFLEX ) 500 MG capsule Take 1 capsule (500 mg total) by mouth 2 (two) times daily.   [DISCONTINUED] Vitamin D , Ergocalciferol , (DRISDOL ) 1.25 MG (50000 UNIT) CAPS capsule Take 1 capsule (50,000 Units total) by mouth every 7 (seven) days.   No facility-administered encounter medications on file as of 07/09/2024.    Allergies (verified) Lisinopril    History: Past Medical History:  Diagnosis Date   Arthritis    Diabetes mellitus without complication (HCC)    HTN (hypertension)    Hyperlipidemia    Osteoporosis    Past Surgical History:  Procedure Laterality Date   ABDOMINAL HYSTERECTOMY     cataract surgery Left 02/04/2021   EYE SURGERY  03/04/2021   Cateract on left eye   Family History  Problem Relation Age of Onset   Diabetes Mother    Diabetes Brother    Social History   Socioeconomic History   Marital status: Widowed    Spouse name: Not on file   Number of children: Not on file   Years of education: Not on file   Highest education level: GED or equivalent  Occupational History   Not on file  Tobacco Use   Smoking status: Never   Smokeless tobacco: Never  Vaping Use   Vaping status: Never Used  Substance and Sexual Activity   Alcohol use: No    Alcohol/week: 0.0 standard drinks of alcohol   Drug use: Never   Sexual activity: Not Currently  Other Topics Concern   Not on file  Social History Narrative   4 children (3 daughters 1 son)- all local   7 grandchildren    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)   Widowed (husband passed 2016)   Lives with Daughter Sherry Perez   Enjoys cross word puzzle, adult coloring books, reads bible   Right handed    Social Drivers of Health   Financial Resource Strain: Low Risk  (07/08/2024)   Overall Financial Resource Strain (CARDIA)     Difficulty of Paying Living Expenses: Not hard at all  Food Insecurity: No Food Insecurity (07/08/2024)   Hunger Vital Sign    Worried About Running Out of Food in the Last Year: Never true    Ran Out of Food in the Last Year: Never true  Transportation Needs: No Transportation Needs (07/08/2024)   PRAPARE - Administrator, Civil Service (Medical): No    Lack of Transportation (Non-Medical): No  Physical Activity: Insufficiently Active (07/08/2024)   Exercise Vital Sign    Days of Exercise per Week: 2 days    Minutes of Exercise per Session: 20 min  Stress: No Stress Concern Present (07/08/2024)   [employer] of Occupational Health - Occupational Stress Questionnaire    Feeling of Stress: Not at all  Social Connections: Moderately Isolated (07/08/2024)  Social Advertising account executive    Frequency of Communication with Friends and Family: Once a week    Frequency of Social Gatherings with Friends and Family: Once a week    Attends Religious Services: 1 to 4 times per year    Active Member of [employer] or Organizations: Yes    Attends Banker Meetings: 1 to 4 times per year    Marital Status: Widowed    Tobacco Counseling Counseling given: Not Answered    Clinical Intake:  Pre-visit preparation completed: Yes  Pain : No/denies pain     BMI - recorded: 23.48 Nutritional Status: BMI of 19-24  Normal Diabetes: Yes CBG done?: No Did pt. bring in CBG monitor from home?: No  Lab Results  Component Value Date   HGBA1C 5.9 09/28/2022   HGBA1C 5.7 06/29/2022   HGBA1C 6.0 03/30/2022     How often do you need to have someone help you when you read instructions, pamphlets, or other written materials from your doctor or pharmacy?: 4 - Often (has dementia) What is the last grade level you completed in school?: GED  Interpreter Needed?: No  Information entered by :: Lolita Libra, CMA   Activities of Daily Living     07/08/2024    7:54  PM  In your present state of health, do you have any difficulty performing the following activities:  Hearing? 0  Vision? 0  Difficulty concentrating or making decisions? 1  Comment has dementia  Walking or climbing stairs? 0  Dressing or bathing? 0  Doing errands, shopping? 0  Preparing Food and eating ? N  Using the Toilet? N  In the past six months, have you accidently leaked urine? N  Do you have problems with loss of bowel control? N  Managing your Medications? Y  Comment daughter helps pt  Managing your Finances? Y  Comment daughter helps manage  Housekeeping or managing your Housekeeping? N    Patient Care Team: Daryl Setter, NP as PCP - General (Internal Medicine) G[place], Kellie, MD as Consulting Physician (Nephrology) Wertman, Sara E, PA-C (Neurology) Norleen Shaver (Ophthalmology)  I have updated your Care Teams any recent Medical Services you may have received from other providers in the past year.     Assessment:   This is a routine wellness examination for Sherry Perez.  Hearing/Vision screen Hearing Screening - Comments:: Denies hearing difficulties.  Vision Screening - Comments:: Wears RX glasses -- up to date with routine eye exams With Dr Shaver at [facility]    Goals Addressed   None    Depression Screen     07/09/2024    1:27 PM 07/07/2023    2:27 PM 07/05/2022    9:42 AM 06/30/2021    9:56 AM 02/10/2021   10:27 AM 11/06/2019    3:21 PM 02/24/2018    3:24 PM  PHQ 2/9 Scores  PHQ - 2 Score 0 0 0 0 0 0 1  PHQ- 9 Score       1    Fall Risk     07/08/2024    7:54 PM 06/30/2023    9:23 AM 07/05/2022    9:42 AM 12/07/2021    9:09 AM 06/30/2021    9:56 AM  Fall Risk   Falls in the past year? 0 0 0 0 0  Number falls in past yr: 0  0 0 0  Injury with Fall? 0  0 0 0  Risk for fall due to :   No  Fall Risks    Follow up Education provided;Falls evaluation completed  Falls evaluation completed   Falls prevention discussed      Data saved with a  previous flowsheet row definition    MEDICARE RISK AT HOME:  Medicare Risk at Home Any stairs in or around the home?: (Patient-Rptd) Yes If so, are there any without handrails?: (Patient-Rptd) No Home free of loose throw rugs in walkways, pet beds, electrical cords, etc?: (Patient-Rptd) Yes Adequate lighting in your home to reduce risk of falls?: (Patient-Rptd) Yes Life alert?: (Patient-Rptd) No Use of a cane, walker or w/c?: (Patient-Rptd) No Grab bars in the bathroom?: (Patient-Rptd) No Shower chair or bench in shower?: (Patient-Rptd) No Elevated toilet seat or a handicapped toilet?: (Patient-Rptd) No  TIMED UP AND GO:  Was the test performed?  No, audio  Cognitive Function: Impaired: Patient has current diagnosis of cognitive impairment.    11/06/2019    3:21 PM 07/10/2019    3:06 PM  MMSE - Mini Mental State Exam  Not completed: Refused   Orientation to time  3  Orientation to Place  3  Registration  3  Attention/ Calculation  4  Recall  0  Language- name 2 objects  2  Language- repeat  1  Language- follow 3 step command  3  Language- read & follow direction  1  Write a sentence  1  Copy design  1  Total score  22      06/12/2021   11:00 AM  Montreal Cognitive Assessment   Visuospatial/ Executive (0/5) 1  Naming (0/3) 0  Attention: Read list of digits (0/2) 1  Attention: Read list of letters (0/1) 0  Attention: Serial 7 subtraction starting at 100 (0/3) 0  Language: Repeat phrase (0/2) 1  Language : Fluency (0/1) 0  Abstraction (0/2) 0  Delayed Recall (0/5) 0  Orientation (0/6) 2  Total 5  Adjusted Score (based on education) 6      07/07/2023    2:27 PM 07/05/2022    9:47 AM  6CIT Screen  What Year? 4 points 0 points  What month? 3 points 0 points  What time? 0 points 0 points  Count back from 20 0 points 4 points  Months in reverse 4 points 4 points  Repeat phrase 10 points 10 points  Total Score 21 points 18 points    Immunizations Immunization  History  Administered Date(s) Administered   Fluad Quad(high Dose 65+) 10/09/2019, 10/10/2020, 08/18/2021, 09/28/2022   Fluad Trivalent(High Dose 65+) 11/11/2023   Influenza Split 10/23/2012, 09/18/2014, 10/08/2015   Influenza, High Dose Seasonal PF 09/13/2016, 10/28/2017, 09/01/2018   Influenza-Unspecified 10/15/2009, 10/23/2010, 09/27/2011   Moderna SARS-COV2 Booster Vaccination 01/12/2021   PFIZER Comirnaty(Gray Top)Covid-19 Tri-Sucrose Vaccine 06/10/2021   PFIZER(Purple Top)SARS-COV-2 Vaccination 02/15/2020, 03/08/2020, 01/12/2021   Pneumococcal Conjugate-13 05/29/2018   Pneumococcal Polysaccharide-23 03/30/2022   Pneumococcal-Unspecified 09/27/2011   Zoster Recombinant(Shingrix ) 06/10/2021, 07/03/2021    Screening Tests Health Maintenance  Topic Date Due   DTaP/Tdap/Td (1 - Tdap) Never done   Zoster Vaccines- Shingrix  (2 of 2) 08/28/2021   HEMOGLOBIN A1C  03/30/2023   COVID-19 Vaccine (6 - 2024-25 season) 08/21/2023   Diabetic kidney evaluation - Urine ACR  01/29/2024   Medicare Annual Wellness (AWV)  07/06/2024   INFLUENZA VACCINE  07/20/2024   Diabetic kidney evaluation - eGFR measurement  02/09/2025   Pneumococcal Vaccine: 50+ Years  Completed   DEXA SCAN  Completed   Hepatitis B Vaccines  Aged Out  HPV VACCINES  Aged Out   Meningococcal B Vaccine  Aged Out   FOOT EXAM  Discontinued   OPHTHALMOLOGY EXAM  Discontinued    Health Maintenance  Health Maintenance Due  Topic Date Due   DTaP/Tdap/Td (1 - Tdap) Never done   Zoster Vaccines- Shingrix  (2 of 2) 08/28/2021   HEMOGLOBIN A1C  03/30/2023   COVID-19 Vaccine (6 - 2024-25 season) 08/21/2023   Diabetic kidney evaluation - Urine ACR  01/29/2024   Medicare Annual Wellness (AWV)  07/06/2024   Health Maintenance Items Addressed: Pt will get 2nd shingles vaccine at next OV. Will complete urine MALB, hgb A1c at next ov. Will get tetanus at pharmacy.  Additional Screening:  Vision Screening: Recommended annual  ophthalmology exams for early detection of glaucoma and other disorders of the eye. Would you like a referral to an eye doctor? No    Dental Screening: Recommended annual dental exams for proper oral hygiene  Community Resource Referral / Chronic Care Management: CRR required this visit?  Resources for Adult day care [facility]) and caregiver relief ([facility] [place]) provided to pt's daughter from [place]. She will let us  know if this does not work out or needs additional referral.  CCM required this visit?  No   Plan:    I have personally reviewed and noted the following in the patient's chart:   Medical and social history Use of alcohol, tobacco or illicit drugs  Current medications and supplements including opioid prescriptions. Patient is not currently taking opioid prescriptions. Functional ability and status Nutritional status Physical activity Advanced directives List of other physicians Hospitalizations, surgeries, and ER visits in previous 12 months Vitals Screenings to include cognitive, depression, and falls Referrals and appointments  In addition, I have reviewed and discussed with patient certain preventive protocols, quality metrics, and best practice recommendations. A written personalized care plan for preventive services as well as general preventive health recommendations were provided to patient.   Lolita Libra, CMA   07/09/2024   After Visit Summary: (MyChart) Due to this being a telephonic visit, the after visit summary with patients personalized plan was offered to patient via MyChart   Notes: See phone note, routed to PCP.

## 2024-07-21 ENCOUNTER — Other Ambulatory Visit: Payer: Self-pay | Admitting: Family

## 2024-07-22 ENCOUNTER — Encounter: Payer: Self-pay | Admitting: Family

## 2024-08-02 ENCOUNTER — Emergency Department (HOSPITAL_BASED_OUTPATIENT_CLINIC_OR_DEPARTMENT_OTHER)

## 2024-08-02 ENCOUNTER — Encounter (HOSPITAL_BASED_OUTPATIENT_CLINIC_OR_DEPARTMENT_OTHER): Payer: Self-pay | Admitting: Emergency Medicine

## 2024-08-02 ENCOUNTER — Other Ambulatory Visit: Payer: Self-pay

## 2024-08-02 ENCOUNTER — Emergency Department (HOSPITAL_BASED_OUTPATIENT_CLINIC_OR_DEPARTMENT_OTHER): Admission: EM | Admit: 2024-08-02 | Discharge: 2024-08-02 | Disposition: A | Discharge status: Home / Self Care

## 2024-08-02 DIAGNOSIS — Z79899 Other long term (current) drug therapy: Secondary | ICD-10-CM | POA: Diagnosis not present

## 2024-08-02 DIAGNOSIS — E1122 Type 2 diabetes mellitus with diabetic chronic kidney disease: Secondary | ICD-10-CM | POA: Insufficient documentation

## 2024-08-02 DIAGNOSIS — N189 Chronic kidney disease, unspecified: Secondary | ICD-10-CM | POA: Diagnosis not present

## 2024-08-02 DIAGNOSIS — I129 Hypertensive chronic kidney disease with stage 1 through stage 4 chronic kidney disease, or unspecified chronic kidney disease: Secondary | ICD-10-CM | POA: Insufficient documentation

## 2024-08-02 DIAGNOSIS — R059 Cough, unspecified: Secondary | ICD-10-CM | POA: Diagnosis present

## 2024-08-02 DIAGNOSIS — I700 Atherosclerosis of aorta: Secondary | ICD-10-CM | POA: Diagnosis not present

## 2024-08-02 DIAGNOSIS — B9721 SARS-associated coronavirus as the cause of diseases classified elsewhere: Secondary | ICD-10-CM

## 2024-08-02 DIAGNOSIS — U071 COVID-19: Secondary | ICD-10-CM | POA: Diagnosis not present

## 2024-08-02 DIAGNOSIS — F0390 Unspecified dementia without behavioral disturbance: Secondary | ICD-10-CM | POA: Insufficient documentation

## 2024-08-02 DIAGNOSIS — R053 Chronic cough: Secondary | ICD-10-CM | POA: Diagnosis not present

## 2024-08-02 DIAGNOSIS — R0989 Other specified symptoms and signs involving the circulatory and respiratory systems: Secondary | ICD-10-CM | POA: Diagnosis not present

## 2024-08-02 DIAGNOSIS — R918 Other nonspecific abnormal finding of lung field: Secondary | ICD-10-CM | POA: Diagnosis not present

## 2024-08-02 LAB — RESP PANEL BY RT-PCR (RSV, FLU A&B, COVID)  RVPGX2
Influenza A by PCR: NEGATIVE
Influenza B by PCR: NEGATIVE
Resp Syncytial Virus by PCR: NEGATIVE
SARS Coronavirus 2 by RT PCR: POSITIVE — AB

## 2024-08-02 LAB — BASIC METABOLIC PANEL WITH GFR
Anion gap: 13 (ref 5–15)
BUN: 17 mg/dL (ref 8–23)
CO2: 24 mmol/L (ref 22–32)
Calcium: 8.9 mg/dL (ref 8.9–10.3)
Chloride: 101 mmol/L (ref 98–111)
Creatinine, Ser: 1.54 mg/dL — ABNORMAL HIGH (ref 0.44–1.00)
GFR, Estimated: 33 mL/min — ABNORMAL LOW (ref >60)
Glucose, Bld: 83 mg/dL (ref 70–99)
Potassium: 3.6 mmol/L (ref 3.5–5.1)
Sodium: 138 mmol/L (ref 135–145)

## 2024-08-02 MED ORDER — BENZONATATE 100 MG PO CAPS: 100.0000 mg | ORAL_CAPSULE | Freq: Three times a day (TID) | ORAL | 0 refills | Status: DC

## 2024-08-02 NOTE — ED Notes (Signed)
 Discharge paperwork reviewed entirely with patient, including follow up care. Pain was under control. The patient received instruction and coaching on their prescriptions, and all follow-up questions were answered.  Pt verbalized understanding as well as all parties involved. No questions or concerns voiced at the time of discharge. No acute distress noted. Pt was encouraged to stay adequately hydrated and eat a healthy diet.   Pt was wheeled out to the [facility] in a wheelchair without incident.  Pt advised they will notify their PCP immediately. To followup on 22nd.   The pt was instructed to set up and/or review MyChart for their results; and was informed their Providers all have access to the information as well.

## 2024-08-02 NOTE — ED Provider Notes (Signed)
 [place] [facility] Provider Note   CSN: [id number] Arrival date & time: 08/02/24  1422     Patient presents with: Cough   REMINGTYN Perez is a 83 y.o. female.   Cough Patient is an 83 year old female to the ED today with her daughter for concerns for cough x 2 days with fever noted today.  Notes that she has had 1 episode of diarrhea, watery, nonbloody, no mucus.  With patient otherwise feeling good.  Daughter at bedside who confirms story.  Reports that she is at her baseline mentation.  Daughter also reported that she had a cough earlier this last week.  Denies body aches, chills, headache, vision changes, odynophagia, dysphagia, neck stiffness, chest pain, shortness of breath, abdominal pain, nausea, vomiting, hematochezia, melena, dysuria, hematuria, lower leg swelling.     Prior to Admission medications   Medication Sig Start Date End Date Taking? Authorizing Provider  benzonatate  (TESSALON ) 100 MG capsule Take 1 capsule (100 mg total) by mouth every 8 (eight) hours. 08/02/24  Yes Lanetta Figuero S, PA-C  acetaminophen  (TYLENOL ) 500 MG tablet Take 2 tablets (1,000 mg total) by mouth 2 (two) times daily. 06/19/20   O'Sullivan, Melissa, NP  alendronate  (FOSAMAX ) 70 MG tablet Take 1 tablet (70 mg total) by mouth every 7 (seven) days. Take with a full glass of water on an empty stomach. 07/09/24   Daryl Setter, NP  amLODipine  (NORVASC ) 10 MG tablet TAKE 1 TABLET(10 MG) BY MOUTH DAILY 07/23/24   Daryl Setter, NP  atorvastatin  (LIPITOR) 80 MG tablet TAKE 1 TABLET(80 MG) BY MOUTH DAILY 06/11/24   O'Sullivan, Melissa, NP  Calcium  Carbonate-Vit D-Min (CALTRATE 600+D PLUS MINERALS) 600-800 MG-UNIT TABS Take 1 tablet by mouth in the morning and at bedtime. Patient not taking: Reported on 07/09/2024 07/16/22   O'Sullivan, Melissa, NP  cloNIDine  (CATAPRES ) 0.1 MG tablet TAKE 1 TABLET(0.1 MG) BY MOUTH DAILY 05/03/24   O'Sullivan, Melissa, NP   Cyanocobalamin  (VITAMIN B12 PO) Take 1 tablet by mouth daily. Patient not taking: Reported on 07/09/2024    [provider]  furosemide  (LASIX ) 40 MG tablet Take 1 tablet (40 mg total) by mouth daily. 05/10/24   O'Sullivan, Melissa, NP  hydrALAZINE  (APRESOLINE ) 25 MG tablet TAKE 1 TABLET(25 MG) BY MOUTH THREE TIMES DAILY 05/03/24   O'Sullivan, Melissa, NP  metoprolol  succinate (TOPROL -XL) 100 MG 24 hr tablet TAKE 1 TABLET BY MOUTH DAILY WITH OR FOLLOWING A MEAL 12/28/23   O'Sullivan, Melissa, NP  potassium chloride  (KLOR-CON  M) 10 MEQ tablet Take 1 tablet (10 mEq total) by mouth daily. 04/12/24   O'Sullivan, Melissa, NP    Allergies: Lisinopril  and Namenda  [memantine ]    Review of Systems  Respiratory:  Positive for cough.   All other systems reviewed and are negative.   Updated Vital Signs BP 138/81 (BP Location: Right Arm)   Pulse 91   Temp (!) 100.4 F (38 C) (Oral)   Resp (!) 24   Ht 5' 6 (1.676 m)   Wt 78.9 kg   SpO2 98%   BMI 28.08 kg/m   Physical Exam Vitals and nursing note reviewed.  Constitutional:      General: She is not in acute distress.    Appearance: Normal appearance. She is not ill-appearing or diaphoretic.  HENT:     Head: Normocephalic and atraumatic.     Mouth/Throat:     Mouth: Mucous membranes are moist.     Pharynx: Oropharynx is clear. No oropharyngeal exudate  or posterior oropharyngeal erythema.  Eyes:     General: No scleral icterus.       Right eye: No discharge.        Left eye: No discharge.     Extraocular Movements: Extraocular movements intact.     Conjunctiva/sclera: Conjunctivae normal.  Cardiovascular:     Rate and Rhythm: Normal rate and regular rhythm.     Pulses: Normal pulses.     Heart sounds: Normal heart sounds. No murmur heard.    No friction rub. No gallop.  Pulmonary:     Effort: Pulmonary effort is normal. No respiratory distress.     Breath sounds: No stridor. No wheezing, rhonchi or rales.  Chest:     Chest  wall: No tenderness.  Abdominal:     General: Abdomen is flat. There is no distension.     Palpations: Abdomen is soft.     Tenderness: There is no abdominal tenderness. There is no right CVA tenderness, left CVA tenderness, guarding or rebound.  Musculoskeletal:        General: No swelling, deformity or signs of injury.     Cervical back: Normal range of motion. No rigidity.     Right lower leg: No edema.     Left lower leg: No edema.  Skin:    General: Skin is warm and dry.     Findings: No bruising, erythema or lesion.  Neurological:     General: No focal deficit present.     Mental Status: She is alert. Mental status is at baseline.     Cranial Nerves: No cranial nerve deficit.     Sensory: No sensory deficit.     Motor: No weakness.     Coordination: Coordination normal.     Comments: Alert and oriented x 2 to person and event.  Unable to name appropriate time and place, which is baseline for patient according to daughter.  Psychiatric:        Mood and Affect: Mood normal.     (all labs ordered are listed, but only abnormal results are displayed) Labs Reviewed  RESP PANEL BY RT-PCR (RSV, FLU A&B, COVID)  [id number] - Abnormal; Notable for the following components:      Result Value   SARS Coronavirus 2 by RT PCR POSITIVE (*)    All other components within normal limits  BASIC METABOLIC PANEL WITH GFR    EKG: None  Radiology: DG Chest 2 View Result Date: 08/02/2024 CLINICAL DATA:  Persistent cough. EXAM: CHEST - 2 VIEW COMPARISON:  12/02/2021 FINDINGS: Low lung volumes limit assessment. The heart is normal in size for technique. Aortic atherosclerosis. Mild peribronchial thickening. No focal airspace disease. No pulmonary edema, pleural effusion or pneumothorax. Degenerative change of both shoulders. IMPRESSION: Mild peribronchial thickening, can be seen with bronchitis or asthma. No focal airspace disease. Electronically Signed   By: Andrea Gasman M.D.   On: 08/02/2024  16:27    Procedures   Medications Ordered in the ED - No data to display                              Medical Decision Making Amount and/or Complexity of Data Reviewed Labs: ordered.   This patient is a 83 year old female with daughter who presents to the ED for concern of cough x 2 days, with daughter also noted that she had recently had a cough as well.  Reports that she has had 1  set of watery diarrhea but otherwise not having any symptoms at this time.  On physical exam, patient is in no acute distress, alert and orient x 2, baseline speaking in full sentences, nontachypneic, nontachycardic.  Notably patient did appear to be febrile with a temp of 100.4 F, LCTAB, RRR, no lower leg edema.  Oropharynx is clear, no exudate and uvula midline.  Unremarkable exam otherwise.  With patient's current symptoms, chest x-ray and respiratory panel were done.  Attempted to get labs however with patient being at her baseline mentation of alert orient x 2, nursing staff, difficult to get labs.  Will get chest x-ray first and if pneumonia present, will obtain labs however otherwise will have her continue to treat symptomatically at home, low concern for sepsis at this time.  Chest x-ray showed likely bronchitis, lower concern for pneumonia.  Rester panel was positive for COVID.  Will provide benzonatate  for cough and have her continue to monitor symptoms at home with daughter.  Will use Tylenol  for continued fever control.  Patient vital signs have remained stable throughout the course of patient's time in the ED. Low suspicion for any other emergent pathology at this time. I believe this patient is safe to be discharged. Provided strict return to ER precautions. Patient expressed agreement and understanding of plan. All questions were answered.  Differential diagnoses prior to evaluation: The emergent differential diagnosis includes, but is not limited to, pneumonia, URI, bronchitis, pleural effusions,  pneumothorax, ACS, PE. This is not an exhaustive differential.   Past Medical History / Co-morbidities / Social History: HTN, HLD, CKD, dementia, diabetes, arthritis  Additional history: Chart reviewed. Pertinent results include:   Last seen in 01/22/2020 by PCP noted to have UTI treated with antibiotics at that time.  BP stable at that time.  Lab Tests/Imaging studies: I personally interpreted labs/imaging and the pertinent results include:    Respiratory panel positive for SARS coronavirus 2.   I agree with the radiologist interpretation.  Medications: I ordered medication including benzonatate .  I have reviewed the patients home medicines and have made adjustments as needed.  Critical Interventions: None  Social Determinants of Health: Decreased mentation at baseline, daughter at bedside who help facilitate appropriate history telling.  Disposition: After consideration of the diagnostic results and the patients response to treatment, I feel that the patient would benefit from discharge treatment as above.   emergency department workup does not suggest an emergent condition requiring admission or immediate intervention beyond what has been performed at this time. The plan is: Follow-up PCP as needed, return for new or worsening symptoms, benzonatate  for symptomatic control. The patient is safe for discharge and has been instructed to return immediately for worsening symptoms, change in symptoms or any other concerns.   Final diagnoses:  SARS-associated coronavirus infection    ED Discharge Orders          Ordered    benzonatate  (TESSALON ) 100 MG capsule  Every 8 hours        08/02/24 1736               Beola Terrall GORMAN DEVONNA 08/02/24 1739    Francesca Elsie CROME, MD 08/02/24 1753

## 2024-08-02 NOTE — ED Triage Notes (Signed)
 Pt reports dry cough x 2 days, denies hx of asthma or other resp issues, denies fever, congestion, HA, or body aches   RT in triage to assess, lung sounds clear

## 2024-08-02 NOTE — ED Notes (Signed)
 EDP notified of lab difficulty and okay with waiting for XR before sticking for CBC.

## 2024-08-02 NOTE — ED Notes (Signed)
 Pt has pulled her hand away from staff during 2 attempts at phlebotomy now. I will obtain blood.

## 2024-08-02 NOTE — Discharge Instructions (Addendum)
 You were seen today for COVID.  Your imaging today was reassuring.  No pneumonia concerns at this time.  However please be sure to monitor for symptoms which would include persistent fever despite Tylenol , chest pain or shortness of breath, if any of these are present, please return to the ED for further evaluation.  Prescribed some cough medication for her.  Please be sure to continue to use Tylenol  for additional fever control.

## 2024-08-02 NOTE — ED Notes (Signed)
 The pt pulled away both times I stuck her for blood. UTO any on my first attempt, and only able to obtain a CMP before the patient moved her wrist and the vein extravasated.

## 2024-08-10 ENCOUNTER — Ambulatory Visit: Admitting: Family

## 2024-08-10 VITALS — BP 138/53 | HR 92 | Temp 98.2°F | Resp 16 | Ht 66.0 in | Wt 150.0 lb

## 2024-08-10 DIAGNOSIS — N1832 Chronic kidney disease, stage 3b: Secondary | ICD-10-CM

## 2024-08-10 DIAGNOSIS — I10 Essential (primary) hypertension: Secondary | ICD-10-CM | POA: Diagnosis not present

## 2024-08-10 DIAGNOSIS — E782 Mixed hyperlipidemia: Secondary | ICD-10-CM

## 2024-08-10 DIAGNOSIS — E559 Vitamin D deficiency, unspecified: Secondary | ICD-10-CM | POA: Diagnosis not present

## 2024-08-10 DIAGNOSIS — F0390 Unspecified dementia without behavioral disturbance: Secondary | ICD-10-CM | POA: Diagnosis not present

## 2024-08-10 DIAGNOSIS — N184 Chronic kidney disease, stage 4 (severe): Secondary | ICD-10-CM

## 2024-08-10 DIAGNOSIS — M810 Age-related osteoporosis without current pathological fracture: Secondary | ICD-10-CM | POA: Diagnosis not present

## 2024-08-10 DIAGNOSIS — F03B0 Unspecified dementia, moderate, without behavioral disturbance, psychotic disturbance, mood disturbance, and anxiety: Secondary | ICD-10-CM

## 2024-08-10 NOTE — Assessment & Plan Note (Addendum)
 Lab Results  Component Value Date   CHOL 209 (H) 01/28/2023   HDL 90 01/28/2023   LDLCALC 104 (H) 01/28/2023   TRIG 60 01/28/2023   CHOLHDL 2.3 01/28/2023  Not on statin. Update lipid panel.

## 2024-08-10 NOTE — Progress Notes (Signed)
 Subjective:     Patient ID: Sherry Perez, female    DOB: 1941-03-18, 83 y.o.   MRN: [id number]  Chief Complaint  Patient presents with   Hypertension    Here for follow up   Osteoporosis    Here for follow up    HPI  Discussed the use of AI scribe software for clinical note transcription with the patient, who gave verbal consent to proceed.  History of Present Illness  Sherry Perez is an 83 year old female with hypertension who presents for a routine follow-up. She is accompanied by her daughter, who is also her primary caregiver.  She is on antihypertensive medications including amlodipine , clonidine , hydralazine , and metoprolol . Her most recent blood pressure reading is 138/53 mmHg. Her daughter assists with medication administration.  She takes Fosamax  weekly for osteoporosis, along with calcium  and vitamin D  supplements. Her last bone density test was two years ago.  She takes Lasix  daily, with some improvement in swelling, though some edema persists.  Her daughter and son are financially burdened by caregiver costs and are exploring Medicaid options. An incident occurred where a caregiver fell asleep, leading to her leaving the house unsupervised. Socially, she enjoys outings such as having ice cream and watching boats.  Her daughter is interested in seeing if she qualifies for Medicaid as they are paying for a private sitter and would love the help paying for a sitter due to her dementia. They placed special locks on the door so she cannot exit on her own as she has started wandering. They also make sure she has 24 hour supervision.  Lab Results  Component Value Date   HGBA1C 5.9 09/28/2022      Health Maintenance Due  Topic Date Due   DTaP/Tdap/Td (1 - Tdap) Never done   Zoster Vaccines- Shingrix  (2 of 2) 08/28/2021   HEMOGLOBIN A1C  03/30/2023   COVID-19 Vaccine (6 - 2024-25 season) 08/21/2023   Diabetic kidney evaluation - Urine ACR  01/29/2024   INFLUENZA  VACCINE  07/20/2024    Past Medical History:  Diagnosis Date   Arthritis    Diabetes mellitus without complication (HCC)    HTN (hypertension)    Hyperlipidemia    Osteoporosis     Past Surgical History:  Procedure Laterality Date   ABDOMINAL HYSTERECTOMY     cataract surgery Left 02/04/2021   EYE SURGERY  03/04/2021   Cateract on left eye    Family History  Problem Relation Age of Onset   Diabetes Mother    Diabetes Brother     Social History   Socioeconomic History   Marital status: Widowed    Spouse name: Not on file   Number of children: Not on file   Years of education: Not on file   Highest education level: GED or equivalent  Occupational History   Not on file  Tobacco Use   Smoking status: Never   Smokeless tobacco: Never  Vaping Use   Vaping status: Never Used  Substance and Sexual Activity   Alcohol use: No    Alcohol/week: 0.0 standard drinks of alcohol   Drug use: Never   Sexual activity: Not Currently  Other Topics Concern   Not on file  Social History Narrative   4 children (3 daughters 1 son)- all local   7 grandchildren    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)   Widowed (husband passed 2016)   Lives with Daughter Jenkins   Enjoys cross word  puzzle, adult coloring books, reads bible   Right handed    Social Drivers of Health   Financial Resource Strain: Low Risk  (07/08/2024)   Overall Financial Resource Strain (CARDIA)    Difficulty of Paying Living Expenses: Not hard at all  Food Insecurity: No Food Insecurity (07/08/2024)   Hunger Vital Sign    Worried About Running Out of Food in the Last Year: Never true    Ran Out of Food in the Last Year: Never true  Transportation Needs: No Transportation Needs (07/08/2024)   PRAPARE - Administrator, Civil Service (Medical): No    Lack of Transportation (Non-Medical): No  Physical Activity: Insufficiently Active (07/08/2024)   Exercise Vital Sign    Days of Exercise per Week: 2 days     Minutes of Exercise per Session: 20 min  Stress: No Stress Concern Present (07/08/2024)   [employer] of Occupational Health - Occupational Stress Questionnaire    Feeling of Stress: Not at all  Social Connections: Moderately Isolated (07/08/2024)   Social Connection and Isolation Panel    Frequency of Communication with Friends and Family: Once a week    Frequency of Social Gatherings with Friends and Family: Once a week    Attends Religious Services: 1 to 4 times per year    Active Member of [employer] or Organizations: Yes    Attends Banker Meetings: 1 to 4 times per year    Marital Status: Widowed  Intimate Partner Violence: Not At Risk (07/09/2024)   Humiliation, Afraid, Rape, and Kick questionnaire    Fear of Current or Ex-Partner: No    Emotionally Abused: No    Physically Abused: No    Sexually Abused: No    Outpatient Medications Prior to Visit  Medication Sig Dispense Refill   acetaminophen  (TYLENOL ) 500 MG tablet Take 2 tablets (1,000 mg total) by mouth 2 (two) times daily. 30 tablet 0   amLODipine  (NORVASC ) 10 MG tablet TAKE 1 TABLET(10 MG) BY MOUTH DAILY 90 tablet 0   atorvastatin  (LIPITOR) 80 MG tablet TAKE 1 TABLET(80 MG) BY MOUTH DAILY 90 tablet 0   cloNIDine  (CATAPRES ) 0.1 MG tablet TAKE 1 TABLET(0.1 MG) BY MOUTH DAILY 90 tablet 0   Cyanocobalamin  (VITAMIN B12 PO) Take 1 tablet by mouth daily.     furosemide  (LASIX ) 40 MG tablet Take 1 tablet (40 mg total) by mouth daily. 90 tablet 1   hydrALAZINE  (APRESOLINE ) 25 MG tablet TAKE 1 TABLET(25 MG) BY MOUTH THREE TIMES DAILY 270 tablet 0   metoprolol  succinate (TOPROL -XL) 100 MG 24 hr tablet TAKE 1 TABLET BY MOUTH DAILY WITH OR FOLLOWING A MEAL 90 tablet 1   potassium chloride  (KLOR-CON  M) 10 MEQ tablet Take 1 tablet (10 mEq total) by mouth daily. 90 tablet 1   alendronate  (FOSAMAX ) 70 MG tablet Take 1 tablet (70 mg total) by mouth every 7 (seven) days. Take with a full glass of water on an empty stomach. 12  tablet 1   benzonatate  (TESSALON ) 100 MG capsule Take 1 capsule (100 mg total) by mouth every 8 (eight) hours. 21 capsule 0   Calcium  Carbonate-Vit D-Min (CALTRATE 600+D PLUS MINERALS) 600-800 MG-UNIT TABS Take 1 tablet by mouth in the morning and at bedtime. (Patient not taking: Reported on 07/09/2024)     No facility-administered medications prior to visit.    Allergies  Allergen Reactions   Lisinopril  Other (See Comments)    Renal insufficiency   Namenda  [Memantine ] Nausea Only  ROS See HPI    Objective:    Physical Exam Constitutional:      General: She is not in acute distress.    Appearance: Normal appearance. She is well-developed.  HENT:     Head: Normocephalic and atraumatic.     Right Ear: External ear normal.     Left Ear: External ear normal.  Eyes:     General: No scleral icterus. Neck:     Thyroid : No thyromegaly.  Cardiovascular:     Rate and Rhythm: Normal rate and regular rhythm.     Heart sounds: Normal heart sounds. No murmur heard. Pulmonary:     Effort: Pulmonary effort is normal. No respiratory distress.     Breath sounds: Normal breath sounds. No wheezing.  Musculoskeletal:     Cervical back: Neck supple.     Right lower leg: 1+ Edema present.     Left lower leg: 1+ Edema present.  Skin:    General: Skin is warm and dry.  Neurological:     Mental Status: She is alert and oriented to person, place, and time.  Psychiatric:        Mood and Affect: Mood normal.        Behavior: Behavior normal.        Thought Content: Thought content normal.        Judgment: Judgment normal.      BP (!) 138/53 (BP Location: Right Arm, Patient Position: Sitting, Cuff Size: Normal)   Pulse 92   Temp 98.2 F (36.8 C) (Oral)   Resp 16   Ht 5' 6 (1.676 m)   Wt 150 lb (68 kg)   SpO2 100%   BMI 24.21 kg/m  Wt Readings from Last 3 Encounters:  08/10/24 150 lb (68 kg)  08/02/24 174 lb (78.9 kg)  07/09/24 159 lb (72.1 kg)       Assessment & Plan:    Problem List Items Addressed This Visit       Unprioritized   Vitamin D  deficiency   On Caltrate + D.  Will update.        Relevant Orders   Vitamin D  (25 hydroxy)   Osteoporosis   Continues fosamax  + caltrate. Will update dexa.       Relevant Orders   DG Bone Density   Hyperlipidemia   Lab Results  Component Value Date   CHOL 209 (H) 01/28/2023   HDL 90 01/28/2023   LDLCALC 104 (H) 01/28/2023   TRIG 60 01/28/2023   CHOLHDL 2.3 01/28/2023  Not on statin. Update lipid panel.        Relevant Orders   Lipid panel   HTN (hypertension)   BP looks good with amlodipine , clonidine , toprol  xl and hydralazine .  Continue same.       Dementia (HCC) - Primary   Has 24 hour supervision.  Will refer to social work so that they can look into helping her apply for medicaid if eligible.       Relevant Orders   AMB Referral VBCI Care Management   CKD stage 3b, GFR 30-44 ml/min (HCC)   Edema much better on daily lasix . Continue same. Keep upcoming appointment with nephrology.        I have discontinued Gessica L. Stmarie's Caltrate 600+D Plus Minerals, alendronate , and benzonatate . I am also having her maintain her acetaminophen , Cyanocobalamin  (VITAMIN B12 PO), metoprolol  succinate, potassium chloride , cloNIDine , hydrALAZINE , furosemide , atorvastatin , and amLODipine .  No orders of the defined types were placed in this  encounter.

## 2024-08-10 NOTE — Assessment & Plan Note (Signed)
 Has 24 hour supervision.  Will refer to social work so that they can look into helping her apply for medicaid if eligible.

## 2024-08-10 NOTE — Addendum Note (Signed)
 Addended by: Jacorian Golaszewski on: 08/10/2024 04:24 PM   Modules accepted: Orders

## 2024-08-10 NOTE — Assessment & Plan Note (Signed)
 BP looks good with amlodipine , clonidine , toprol  xl and hydralazine .  Continue same.

## 2024-08-10 NOTE — Assessment & Plan Note (Signed)
 Continues fosamax  + caltrate. Will update dexa.

## 2024-08-10 NOTE — Patient Instructions (Signed)
 VISIT SUMMARY:  Today, you came in for a routine follow-up visit. Your blood pressure is well controlled, and we discussed your current medications and health conditions. We also talked about your social support needs and potential Medicaid options for additional caregiver support.  YOUR PLAN:  HYPERTENSION: Your blood pressure is well controlled at 138/53 mmHg with your current medications. -Continue taking amlodipine , clonidine , hydralazine , and metoprolol  as prescribed.  CHRONIC KIDNEY DISEASE: You have a scheduled nephrology appointment in September to monitor your kidney function. -Attend your kidney specialist appointment in September. -We will order lab tests to check your kidney function.  EDEMA: Your swelling is being managed with daily Lasix , though some edema persists. -Continue taking Lasix  daily as prescribed.  OSTEOPOROSIS: You are taking Fosamax  weekly and calcium  with vitamin D  for your bone health. Your last bone density scan was two years ago. -We will order a bone density scan to check your bone health.  SOCIAL SUPPORT NEEDS: Your family is exploring Medicaid options for additional caregiver support due to financial burdens. -We will refer you to social work to explore Medicaid options for sitter services.  GENERAL HEALTH MAINTENANCE: You are taking calcium  with vitamin D , but no additional vitamin D  supplementation. -We will order lab tests to check your cholesterol and vitamin D  levels.

## 2024-08-10 NOTE — Addendum Note (Signed)
 Addended by: Orva Gwaltney on: 08/10/2024 04:08 PM   Modules accepted: Orders

## 2024-08-10 NOTE — Assessment & Plan Note (Signed)
 On Caltrate + D.  Will update.

## 2024-08-10 NOTE — Assessment & Plan Note (Addendum)
 Edema much better on daily lasix . Continue same. Keep upcoming appointment with nephrology.

## 2024-08-26 ENCOUNTER — Other Ambulatory Visit: Payer: Self-pay | Admitting: Family

## 2024-08-30 ENCOUNTER — Telehealth: Payer: Self-pay

## 2024-08-30 NOTE — Progress Notes (Signed)
 Complex Care Management Note Care Guide Note  08/30/2024 Name: Sherry Perez MRN: [id number] DOB: [birth year]   Complex Care Management Outreach Attempts: An unsuccessful telephone outreach was attempted today to offer the patient information about available complex care management services.  Follow Up Plan:  Additional outreach attempts will be made to offer the patient complex care management information and services.   Encounter Outcome:  No Answer  Dreama [facility]  [facility], [facility] VBCI Assistant Direct Dial: [phone]  Fax: [phone]

## 2024-08-30 NOTE — Progress Notes (Signed)
 Complex Care Management Note  Care Guide Note 08/30/2024 Name: Sherry Perez MRN: [id number] DOB: Feb 28, 1941  Sherry Perez is a 83 y.o. year old female who sees Daryl Setter, NP for primary care. I reached out to [name] by phone today to offer complex care management services.  [name] was given information about Complex Care Management services today including:   The Complex Care Management services include support from the care team which includes your Nurse Care Manager, Clinical Social Worker, or Pharmacist.  The Complex Care Management team is here to help remove barriers to the health concerns and goals most important to you. Complex Care Management services are voluntary, and the patient may decline or stop services at any time by request to their care team member.   Complex Care Management Consent Status: Patient agreed to services and verbal consent obtained.   Follow up plan:  Telephone appointment with complex care management team member scheduled for:  09/07/24 at 3:00 p.m.   Encounter Outcome:  Patient Scheduled  Dreama [facility]  [facility], [facility] VBCI Assistant Direct Dial: [phone]  Fax: [phone]

## 2024-09-07 ENCOUNTER — Other Ambulatory Visit: Payer: Self-pay

## 2024-09-07 NOTE — Patient Outreach (Signed)
 LCSW called patient's daughterGLENWOOD Sham and discussed the reason for referral and Sham explained that she had questions about the Medicaid application and the information that is needed to fill it out. Sham explained that she attempted to fill it out online and tried to save it but it didn't save. LCSW informed Sham that she should be able to save application  as she fills it in. Sham stated that she needed to try again. LCSW suggested that [facility] call DSS and ask them about the required information.  LCSW explored other needs that patient may need assistance and SDOH screenings were negative as well as mental health screening. Sham explained that she only wanted assistance with knowing what information is required for the application.LCSW and Annie discussed that if patient is approved for Medicaid that she may be eligible for [facility] services and Sham explained that would assist with caring for her mom.   Annie informed LCSW that she would try again with application and if any needs arise she would reach out to LCSW. Patient is not being enrolled into VBCI services and Sham will reach out if a need arises.  Olam Ally, MSW, LCSW [place]  [facility], [facility] Licensed Clinical Social Worker Direct Dial: [phone]

## 2024-09-07 NOTE — Patient Instructions (Signed)
 Visit Information  Thank you for taking time to visit with me today. Please don't hesitate to contact me if I can be of assistance to you before our next scheduled appointment.  Our next appointment is no further scheduled appointments.  Please call the care guide team at [phone] if you need to cancel or reschedule your appointment. Caregiver has declined VBCI services at this time.   Please call 911 if you are experiencing a Mental Health or Behavioral Health Crisis or need someone to talk to.  Patient verbalizes understanding of instructions and care plan provided today and agrees to view in MyChart. Active MyChart status and patient understanding of how to access instructions and care plan via MyChart confirmed with patient.     Olam Ally, MSW, LCSW [place]  [facility], [facility] Licensed Clinical Social Worker Direct Dial: [phone]

## 2024-09-21 ENCOUNTER — Other Ambulatory Visit: Payer: Self-pay | Admitting: Family

## 2024-10-02 ENCOUNTER — Ambulatory Visit (HOSPITAL_BASED_OUTPATIENT_CLINIC_OR_DEPARTMENT_OTHER)
Admission: RE | Admit: 2024-10-02 | Discharge: 2024-10-02 | Disposition: A | Discharge status: Home / Self Care | Source: Ambulatory Visit | Attending: Family | Admitting: Family

## 2024-10-02 DIAGNOSIS — Z780 Asymptomatic menopausal state: Secondary | ICD-10-CM | POA: Diagnosis not present

## 2024-10-02 DIAGNOSIS — M810 Age-related osteoporosis without current pathological fracture: Secondary | ICD-10-CM | POA: Insufficient documentation

## 2024-10-04 ENCOUNTER — Telehealth: Payer: Self-pay | Admitting: Family

## 2024-10-04 DIAGNOSIS — M810 Age-related osteoporosis without current pathological fracture: Secondary | ICD-10-CM

## 2024-10-04 NOTE — Telephone Encounter (Signed)
 Please advise daughter that her bone density shows bone thinning since last check.  I would like to try to get her approved for Prolia injection.  Once she starts prolia she can stop fosamax .   Levorn, can you please forward this to the Prolia team?

## 2024-10-05 MED ORDER — DENOSUMAB 60 MG/ML ~~LOC~~ SOSY: 60.0000 mg | PREFILLED_SYRINGE | Freq: Once | SUBCUTANEOUS | Status: AC

## 2024-10-05 NOTE — Telephone Encounter (Signed)
Prolia initiated.

## 2024-10-11 ENCOUNTER — Telehealth: Payer: Self-pay

## 2024-10-11 ENCOUNTER — Other Ambulatory Visit (HOSPITAL_COMMUNITY): Payer: Self-pay

## 2024-10-11 NOTE — Telephone Encounter (Signed)
 SABRA

## 2024-10-11 NOTE — Telephone Encounter (Signed)
 Prolia  VOB initiated via [email or web address]  Next Prolia  inj DUE: NEW START

## 2024-10-11 NOTE — Telephone Encounter (Signed)
 Pt ready for scheduling for PROLIA on or after : 10/11/24  Option# 1: Buy/Bill (Office supplied medication)  Out-of-pocket cost due at time of clinic visit: $367  Number of injection/visits approved: 2  Primary: [employer]-MEDICARE Prolia co-insurance: 20% Admin fee co-insurance: $35  Secondary: --- Prolia co-insurance:  Admin fee co-insurance:   Medical Benefit Details: Date Benefits were checked: 10/11/24 Deductible: NO/ Coinsurance: 20%/ Admin Fee: $35  Prior Auth: APPROVED PA# [id number] Expiration Date: 10/11/24-10/11/25  # of doses approved: 2 ----------------------------------------------------------------------- Option# 2- Med Obtained from pharmacy:  Pharmacy benefit: Copay $720 (Paid to pharmacy) Admin Fee: $35 (Pay at clinic)  Prior Auth: N/A PA# Expiration Date:   # of doses approved:   If patient wants fill through the pharmacy benefit please send prescription to: [facility], and include estimated need by date in rx notes. Pharmacy will ship medication directly to the office.  Patient NOT eligible for Prolia Copay Card. Copay Card can make patient's cost as little as $25. Link to apply: [email or web address]  ** This summary of benefits is an estimation of the patient's out-of-pocket cost. Exact cost may very based on individual plan coverage.

## 2024-10-15 NOTE — Telephone Encounter (Signed)
 Unfortunately, there is not another good option.  I would recommend that she continue fosamax  in that case.

## 2024-10-15 NOTE — Telephone Encounter (Signed)
 Spoke with daughter Zelda and cost is too much [phone] OOP).  She would like to see if there is any other options.

## 2024-10-15 NOTE — Telephone Encounter (Signed)
Patient's daughter notified of this information.

## 2025-01-05 ENCOUNTER — Other Ambulatory Visit: Payer: Self-pay | Admitting: Family

## 2025-02-12 ENCOUNTER — Ambulatory Visit: Admitting: Family

## 2025-07-10 ENCOUNTER — Ambulatory Visit
